# Patient Record
Sex: Female | Born: 1950
Health system: Southern US, Community
[De-identification: ages and names within clinical notes are randomized; demographics above are authoritative.]

## PROBLEM LIST (undated history)

## (undated) DIAGNOSIS — E079 Disorder of thyroid, unspecified: Secondary | ICD-10-CM

## (undated) HISTORY — PX: EYE SURGERY: SHX253

## (undated) HISTORY — PX: BLADDER REMOVAL: SHX567

## (undated) HISTORY — PX: BARTHOLIN CYST MARSUPIALIZATION: SHX5383

## (undated) HISTORY — DX: Disorder of thyroid, unspecified: E07.9

## (undated) HISTORY — PX: COSMETIC SURGERY: SHX468

## (undated) HISTORY — PX: LIPOSUCTION: SHX10

## (undated) HISTORY — PX: OTHER SURGICAL HISTORY: SHX169

---

## 2007-07-14 LAB — HM CT VIRTUAL COLONOSCOPY

## 2008-02-21 ENCOUNTER — Encounter (INDEPENDENT_AMBULATORY_CARE_PROVIDER_SITE_OTHER): Payer: Self-pay | Admitting: *Deleted

## 2008-02-21 ENCOUNTER — Encounter: Admission: RE | Admit: 2008-02-21 | Discharge: 2008-02-21 | Payer: Self-pay | Admitting: Family Medicine

## 2008-03-03 ENCOUNTER — Encounter: Admission: RE | Admit: 2008-03-03 | Discharge: 2008-04-18 | Payer: Self-pay | Admitting: Unknown Physician Specialty

## 2008-11-22 ENCOUNTER — Encounter: Admission: RE | Admit: 2008-11-22 | Discharge: 2008-11-22 | Payer: Self-pay | Admitting: Sports Medicine

## 2008-11-22 ENCOUNTER — Ambulatory Visit: Payer: Self-pay | Admitting: Sports Medicine

## 2008-11-22 DIAGNOSIS — M76899 Other specified enthesopathies of unspecified lower limb, excluding foot: Secondary | ICD-10-CM

## 2008-11-22 DIAGNOSIS — R109 Unspecified abdominal pain: Secondary | ICD-10-CM | POA: Insufficient documentation

## 2009-01-11 ENCOUNTER — Ambulatory Visit: Payer: Self-pay | Admitting: Sports Medicine

## 2009-01-11 DIAGNOSIS — M7750 Other enthesopathy of unspecified foot: Secondary | ICD-10-CM | POA: Insufficient documentation

## 2009-12-05 ENCOUNTER — Encounter (INDEPENDENT_AMBULATORY_CARE_PROVIDER_SITE_OTHER): Payer: Self-pay | Admitting: *Deleted

## 2010-01-23 ENCOUNTER — Ambulatory Visit: Payer: Self-pay | Admitting: Gastroenterology

## 2010-01-23 DIAGNOSIS — M542 Cervicalgia: Secondary | ICD-10-CM | POA: Insufficient documentation

## 2010-01-23 DIAGNOSIS — R1310 Dysphagia, unspecified: Secondary | ICD-10-CM | POA: Insufficient documentation

## 2010-01-24 ENCOUNTER — Telehealth: Payer: Self-pay | Admitting: Physician Assistant

## 2010-01-24 ENCOUNTER — Ambulatory Visit: Payer: Self-pay | Admitting: Internal Medicine

## 2010-01-31 ENCOUNTER — Telehealth: Payer: Self-pay | Admitting: Gastroenterology

## 2010-01-31 DIAGNOSIS — J984 Other disorders of lung: Secondary | ICD-10-CM | POA: Insufficient documentation

## 2010-02-06 ENCOUNTER — Ambulatory Visit (HOSPITAL_COMMUNITY): Admission: RE | Admit: 2010-02-06 | Discharge: 2010-02-06 | Payer: Self-pay | Admitting: Gastroenterology

## 2010-02-18 ENCOUNTER — Ambulatory Visit: Payer: Self-pay | Admitting: Internal Medicine

## 2010-04-26 ENCOUNTER — Encounter
Admission: RE | Admit: 2010-04-26 | Discharge: 2010-04-26 | Payer: Self-pay | Source: Home / Self Care | Attending: Internal Medicine | Admitting: Internal Medicine

## 2010-05-02 ENCOUNTER — Telehealth (INDEPENDENT_AMBULATORY_CARE_PROVIDER_SITE_OTHER): Payer: Self-pay | Admitting: *Deleted

## 2010-05-21 NOTE — Letter (Signed)
Summary: New Patient letter  [facility]  [address] [place], [place] [address]   Phone: [phone]  Fax: [phone]       12/05/2009 MRN: [id number]  Susan Davila Davila [address] [place], [place]  [address]  Dear Ms. Susan Davila,  Welcome to the Gastroenterology Division at [facility].    You are scheduled to see Dr.  Arlyce Dice on 01-23-10 at 8:30a.m. on the 3rd floor at [facility], [address]  We ask that you try to arrive at our office 15 minutes prior to your appointment time to allow for check-in.  We would like you to complete the enclosed self-administered evaluation form prior to your visit and bring it with you on the day of your appointment.  We will review it with you.  Also, please bring a complete list of all your medications or, if you prefer, bring the medication bottles and we will list them.  Please bring your insurance card so that we may make a copy of it.  If your insurance requires a referral to see a specialist, please bring your referral form from your primary care physician.  Co-payments are due at the time of your visit and may be paid by cash, check or credit card.     Your office visit will consist of a consult with your physician (includes a physical exam), any laboratory testing he/she may order, scheduling of any necessary diagnostic testing (e.g. x-ray, ultrasound, CT-scan), and scheduling of a procedure (e.g. Endoscopy, Colonoscopy) if required.  Please allow enough time on your schedule to allow for any/all of these possibilities.    If you cannot keep your appointment, please call [phone] to cancel or reschedule prior to your appointment date.  This allows [place] the opportunity to schedule an appointment for another patient in need of care.  If you do not cancel or reschedule by 5 p.m. the business day prior to your appointment date, you will be charged a $50.00 late cancellation/no-show fee.    Thank you for choosing [facility] for your medical needs.  We appreciate the opportunity to care for you.  Please visit [place] at our website  to learn more about our practice.                     Sincerely,                                                             The Gastroenterology Division

## 2010-05-21 NOTE — Assessment & Plan Note (Signed)
Summary: Pulmonary eval for hoarseness/ spn rul     Visit Type:  Initial Consult Primary Muadh Creasy/Referring Sharri Loya:  Osie Cheeks, MD  CC:  Pulmonary Nodule.  History of Present Illness: 56 yowf quit smoking in 1978 but was only a light smoker with no respiratory problems.  February 18, 2010  1st pulmonary office eval for spn.  Story started with  blunt injury to right neck  July 2009 and difficulty singing and swallowing since  >  ENT and GI eval completed.  tried GERD rx x 8 weeks before but only prilosec and not ac or with any diet restrictions.  .No progression of symptoms, no dyspnea or variability.  Pt denies any significantitching, sneezing,   excess or purulent  secretions,  fever, chills, sweats, unintended wt loss, pleuritic or exertional cp, hempoptysis, change in activity tolerance  orthopnea pnd or leg swelling. Pt also denies any obvious fluctuation in symptoms with weather or environmental change or other alleviating or aggravating factors.       Current Medications (verified): 1)  Levothyroxine Sodium 50 Mcg Tabs (Levothyroxine Sodium) .... One Tablet By Mouth Daily 2)  Pristiq 50 Mg Xr24h-Tab (Desvenlafaxine Succinate) .... 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 Quit in 1977.  Smoked 7 yrs up to 1 ppd ETOH daily- Wine Lives with spouse, 3 cats and 3 dogs  Review of Systems       The patient complains of acid heartburn, indigestion, difficulty swallowing, sore throat, nasal congestion/difficulty breathing through nose, and ear ache.  The patient denies shortness of  breath with activity, shortness of breath at rest, productive cough, non-productive cough, coughing up blood, chest pain, irregular heartbeats, loss of appetite, weight change, abdominal pain, tooth/dental problems, headaches, sneezing, itching, anxiety, depression, hand/feet swelling, joint stiffness or pain, rash, change in color of mucus, and fever.    Vital Signs:  Patient profile:   60 year old female Weight:      163 pounds O2 Sat:      95 % on Room air Temp:     98.0 degrees F oral Pulse rate:   64 / minute BP sitting:   122 / 78  (left arm)  Vitals Entered By: Vernie Murders (February 18, 2010 11:23 AM)  O2 Flow:  Room air  Physical Exam  Additional Exam:   wt 158 > 163 February 19, 2010 HEENT: nl dentition, turbinates, and orophanx. Nl external ear canals without cough reflex NECK :  without JVD/Nodes/TM/ nl carotid upstrokes bilaterally LUNGS: no acc muscle use, clear to A and P bilaterally without cough on insp or exp maneuvers CV:  RRR  no s3 or murmur or increase in P2, no edema  ABD:  soft and nontender with nl excursion in the supine position. No bruits or organomegaly, bowel sounds nl MS:  warm without deformities, calf tenderness, cyanosis or clubbing SKIN: warm and dry without lesions   NEURO:  alert, approp, no deficits      Impression & Recommendations:  Problem # 1:  PULMONARY NODULE (ICD-518.89) No baseline cxr available, completely unrelated to  symptoms which are probably at least partially GERD related.  Too small to bx, PET is negative so excisional bx not indicated here but if any growth at all at 3 months would consider this   Discussed in detail all the  indications, usual  risks and alternatives  relative to the benefits with patient who agrees to proceed with full chest Ct in 3 months  Problem # 2:  DYSPHAGIA UNSPECIFIED (ICD-787.20)  Typical of  Classic Upper airway cough syndrome, so named because it's frequently impossible to sort out how much  is  CR/sinusitis with freq throat clearing (which can be related to primary GERD)   vs  causing  secondary extra esophageal GERD from wide swings in gastric pressure that occur with throat clearing, promoting self use of mint and menthol lozenges that reduce the lower esophageal sphincter tone and exacerbate the problem further These are the same pts who not infrequently have failed to tolerate ace inhibitors,  dry powder inhalers or biphosphonates or report having reflux symptoms that don't respond to standard doses of PPI  Rec max gerd rx to start with ? consider neurontin trial for ? neuralgia  > next step  See instructions for specific recommendations   Orders: Consultation Level V (81191)  Medications Added to Medication List This Visit: 1)  Prilosec Otc 20 Mg Tbec (Omeprazole magnesium) .... Take one 30-60 min before first and last meals of the day 2)  Pepcid 20 Mg Tabs (Famotidine) .... Take one by mouth at bedtime  Other Orders: Misc. Referral (Misc. Ref)  Patient Instructions: 1)  GERD (REFLUX)  is a common cause of respiratory symptoms. It commonly presents without heartburn and can be treated with medication, but also with lifestyle changes including avoidance of late meals, excessive alcohol, smoking cessation, and avoid fatty foods, chocolate, peppermint, colas, red wine, and acidic juices such as orange juice. NO MINT OR MENTHOL PRODUCTS SO NO COUGH DROPS  2)  USE SUGARLESS CANDY INSTEAD (jolley ranchers)  3)  NO OIL BASED VITAMINS  4)  Prilosec Take one 30-60 min before first and last meals of the day and pepcid 20 mg one at bedtime 5)  see patient coordinator for CT Chest no contrast in 3 months ( Apr 26 2010 ) to follow up lung nodules

## 2010-05-21 NOTE — Letter (Signed)
Summary: Results Letter  [facility]  [address] [place], [place] [address]   Phone: [phone]  Fax: [phone]        January 23, 2010 MRN: [id number]    ROSLIND MICHAUX [address] [place], [place]  [address]    Dear Ms. WEST,  It is my pleasure to have treated you recently as a new patient in my office. I appreciate your confidence and the opportunity to participate in your care.  Since I do have a busy inpatient endoscopy schedule and office schedule, my office hours vary weekly. I am, however, available for emergency calls everyday through my office. If I am not available for an urgent office appointment, another one of our gastroenterologist will be able to assist you.  My well-trained staff are prepared to help you at all times. For emergencies after office hours, a physician from our Gastroenterology section is always available through my 24 hour answering service  Once again I welcome you as a new patient and I look forward to a happy and healthy relationship             Sincerely,  Louis Meckel MD  This letter has been electronically signed by your physician.  Appended Document: Results Letter LETTER MAILED  Appended Document: Results Letter letter mailed

## 2010-05-21 NOTE — Assessment & Plan Note (Signed)
Summary: problems swallowing--ch.   History of Present Illness Visit Type: Initial Visit Primary GI MD: Melvia Heaps MD [facility] Primary Provider: Osie Cheeks, MD Chief Complaint: dysphagia- feels like there is some blokage on right side of throat, also a built up of mucous History of Present Illness:   Susan Davila is a pleasant 60 year old white female referred at the request of Dr. Hebert Soho for evaluation of neck discomfort.  Approximally  a year and half ago she onto her neck on the right side.  Since that time she has had the sensation of blockage on the right side of her throat.  This occurs spontaneously and with swallowing.  She  has  see hte sensation of spontaneious drainage.  She denies dysphagia, per se.  She had a thorough ENT examination that did not reveal any abnormalities.  Since her trauma she has had difficulty swallowing.   She complains of a buildup of mucous in the right side of her throat that eventually drains.  Symptoms did not improve with PPI therapy and antihistamines.  The patient underwent virtual colonoscopy in November, 2009 for colorectal cancer screening.  This test was   GI Review of Systems      Denies abdominal pain, acid reflux, belching, bloating, chest pain, dysphagia with liquids, dysphagia with solids, heartburn, loss of appetite, nausea, vomiting, vomiting blood, weight loss, and  weight gain.        Denies anal fissure, black tarry stools, change in bowel habit, constipation, diarrhea, diverticulosis, fecal incontinence, heme positive stool, hemorrhoids, irritable bowel syndrome, jaundice, light color stool, liver problems, rectal bleeding, and  rectal pain. Preventive Screening-Counseling & Management  Alcohol-Tobacco     Smoking Status: quit      Drug Use:  no.      Current Medications (verified): 1)  Levothyroxine Sodium 50 Mcg Tabs (Levothyroxine Sodium) .... One Tablet By Mouth Daily 2)  Pristiq 50 Mg Xr24h-Tab (Desvenlafaxine  Succinate) .... 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  Alcohol Use - yes Illicit Drug Use - no Smoking Status:  quit Drug Use:  no  Review of Systems       The patient complains of sleeping problems, sore throat, and voice change.  The patient denies allergy/sinus, anemia, anxiety-new, arthritis/joint pain, back pain, blood in urine, breast changes/lumps, change in vision, confusion, cough, coughing up blood, depression-new, fainting, fatigue, fever, headaches-new, hearing problems, heart murmur, heart rhythm changes, itching, menstrual pain, muscle pains/cramps, night sweats, nosebleeds, pregnancy symptoms, shortness of breath, skin rash, swelling of feet/legs, swollen lymph glands, thirst - excessive , urination - excessive , urination changes/pain, urine leakage, and vision changes.         All other systems were reviewed and were negative   Vital Signs:  Patient profile:   60 year old female Height:      68 inches Weight:      158.13 pounds BMI:     24.13 Pulse rate:   64 / minute Pulse rhythm:   regular BP sitting:   118 / 70  (left arm) Cuff size:   regular  Vitals Entered By: June McMurray CMA Duncan Dull) (January 23, 2010 8:22 AM)  Physical Exam  Additional Exam:  On  physical exam she is a well-developed well-nourished female  skin: anicteric HEENT: normocephalic; PEERLA; no nasal or pharyngeal abnormalities neck: supple nodes: no cervical lymphadenopathy chest: clear to ausculatation and percussion heart: no murmurs, gallops, or rubs abd: soft, nontender; BS normoactive; no abdominal masses, tenderness, organomegaly rectal:  deferred ext: no cynanosis, clubbing, edema skeletal: no deformities neuro: oriented x 3; no focal abnormalities    Impression & Recommendations:  Problem # 1:  DYSPHAGIA UNSPECIFIED (ICD-787.20)  Dysphagia actually refers to the discomfort in her throat.  This clearly is related to her trauma and may represent some structural abnormality.  Primary esophageal abnormality including stricture is unlikely.  Is no obvious abnormality by physical exam  Recommendations #1 CT of the neck  Orders: T-CT Neck w/cm ([id number])  Patient Instructions: 1)  Copy sent to : Osie Cheeks, MD 2)  You are scheduled at [facility] CT on October 6th at 9am  3)  CT of the neck due to neck pain 4)  The medication list was reviewed and reconciled.  All changed / newly prescribed medications were explained.  A complete medication list was provided to the patient / caregiver.

## 2010-05-21 NOTE — Progress Notes (Signed)
Summary: Review from doctor to have CT of neck  Phone Note Outgoing Call Call back at [facility] Phone [phone]   Call placed by: Merri Ray CMA Duncan Dull),  January 24, 2010 8:20 AM Summary of Call: Called pt to inform we are having problems with insurance covering her CT of the neck. Spoke with Mike Gip yesterday gave her the information for a pt review. Have not heard back yet. Called pt to cancel test. Can not get in touch with her this AM. Called Jody at CT to talk with the pt and see if she wants to cancel..... Initial call taken by: Merri Ray CMA Duncan Dull),  January 24, 2010 8:22 AM  Follow-up for Phone Call        DONE Follow-up by: Sammuel Cooper PA-c,  January 24, 2010 8:46 AM     Appended Document: Review from doctor to have CT of neck CT OF NECK APPROVED [id number] ALSO IF PT NEEDS MRI OF SPINE SHE WOULD BE APPROVED FOR THAT ALSO

## 2010-05-21 NOTE — Progress Notes (Signed)
Summary: results request  Phone Note Call from Patient Call back at Home Phone [phone]   Caller: Patient Call For: Dr. Arlyce Dice Reason for Call: Talk to Nurse Summary of Call: would like CT scan results Initial call taken by: Vallarie Mare,  January 31, 2010 2:31 PM  Follow-up for Phone Call        Dr Arlyce Dice patient was calling back for results.  She has not heard from Dr Richeter's office about additional scan.  Is it ok for [place] to set up PET as recommended in the CT neck report?  Or refer her to pulmonary, she is very concerned? Follow-up by: Darcey Nora RN, CGRN,  January 31, 2010 3:01 PM  Additional Follow-up for Phone Call Additional follow up Details #1::        Lets go ahead with PET scan and refer her to pulmonary Additional Follow-up by: Louis Meckel MD,  January 31, 2010 4:24 PM  New Problems: PULMONARY NODULE (ICD-518.89)   Additional Follow-up for Phone Call Additional follow up Details #2::    PET scan ordered for 02/06/10 [facility] 7:00 Pulmonary referral to [facility] 02/18/10 11:00.  Patient  aware of both appts  Follow-up by: Darcey Nora RN, CGRN,  January 31, 2010 4:43 PM  New Problems: PULMONARY NODULE (ICD-518.89)

## 2010-05-23 NOTE — Progress Notes (Signed)
  Phone Note Other Incoming   Request: Send information Summary of Call: Request for records received from the patient. Request forwarded to [employer].  Susan Davila records from 2011 to 2012 sent to Dr. Erline Hau.

## 2010-06-18 ENCOUNTER — Telehealth (INDEPENDENT_AMBULATORY_CARE_PROVIDER_SITE_OTHER): Payer: Self-pay | Admitting: *Deleted

## 2010-06-27 NOTE — Progress Notes (Signed)
  Phone Note Other Incoming   Request: Send information Summary of Call:  Request for records received from Patient, request forwarded to [facility]

## 2010-07-03 LAB — GLUCOSE, CAPILLARY: Glucose-Capillary: 104 mg/dL — ABNORMAL HIGH (ref 70–99)

## 2010-11-05 ENCOUNTER — Telehealth: Payer: Self-pay | Admitting: Internal Medicine

## 2010-11-05 NOTE — Telephone Encounter (Signed)
9-24 months was the radiology recommendation so we picked the earliest date - not sure insurance will cover @ < 9 months but can try and if not do a very limited study that will save money in September (and less radiation) than a whole study

## 2010-11-05 NOTE — Telephone Encounter (Signed)
Pt says she will be due for repeat CT chest around 9/1. She would like to have this done before then if Mw agrees due to insurance reasons. Pls advise.

## 2010-11-05 NOTE — Telephone Encounter (Signed)
Pt is aware and says since snsurance will most likely not cover another CT scan at this time, she will wait.

## 2010-11-29 ENCOUNTER — Telehealth: Payer: Self-pay | Admitting: *Deleted

## 2010-11-29 NOTE — Telephone Encounter (Signed)
Message copied by Christen Butter on Fri Nov 29, 2010  4:58 PM ------      Message from: Christen Butter      Created: Mon Jul 22, 2010  3:46 PM       Needs repeat limited ct chest f/u spn

## 2010-11-29 NOTE — Telephone Encounter (Signed)
Repeat limited ct chest due Sept per MW- called pt to advise and go ahead get this sched. LMTCB.

## 2010-12-30 NOTE — Telephone Encounter (Signed)
LMTCB

## 2011-01-01 NOTE — Telephone Encounter (Signed)
LMTCB

## 2011-01-15 ENCOUNTER — Encounter: Payer: Self-pay | Admitting: *Deleted

## 2011-01-15 NOTE — Telephone Encounter (Signed)
Will send letter.

## 2011-03-26 ENCOUNTER — Ambulatory Visit (INDEPENDENT_AMBULATORY_CARE_PROVIDER_SITE_OTHER): Payer: 59

## 2011-03-26 DIAGNOSIS — N390 Urinary tract infection, site not specified: Secondary | ICD-10-CM

## 2011-08-12 ENCOUNTER — Other Ambulatory Visit: Payer: Self-pay | Admitting: Family Medicine

## 2011-08-26 ENCOUNTER — Ambulatory Visit (INDEPENDENT_AMBULATORY_CARE_PROVIDER_SITE_OTHER): Payer: 59 | Admitting: Family Medicine

## 2011-08-26 VITALS — BP 126/80

## 2011-08-26 DIAGNOSIS — M7072 Other bursitis of hip, left hip: Secondary | ICD-10-CM

## 2011-08-26 DIAGNOSIS — G5702 Lesion of sciatic nerve, left lower limb: Secondary | ICD-10-CM

## 2011-08-26 DIAGNOSIS — G5700 Lesion of sciatic nerve, unspecified lower limb: Secondary | ICD-10-CM

## 2011-08-26 DIAGNOSIS — M76899 Other specified enthesopathies of unspecified lower limb, excluding foot: Secondary | ICD-10-CM

## 2011-08-26 NOTE — Patient Instructions (Signed)
PIRIFORMIS SYNDROME and HIP REHAB 2. hip abductor rotations. standing, hip flexion and rotation outward then inward. 3 sets, 15 reps. when can do comfortably, add ankle weights starting at 2 pounds.  3. cross over stretching - shoulder back to ground, same side leg crossover. 3-5 sets for 30 min..  4. SINK STRETCH - YOU CAN DO THIS WHENEVER YOU WANT DURING THE DAY  Tennis ball underneath area in buttocks - on a hard surface underneath  Hip abductions to side. 3 x 30

## 2011-08-29 NOTE — Progress Notes (Signed)
  Patient Name: Susan Davila Date of Birth: [birth year] Age: 61 y.o. Medical Record Number: [id number] Gender: female Date of Encounter: 08/26/2011  History of Present Illness:  Susan Davila is a 61 y.o. very pleasant female patient who presents with the following:  Pleasant patient with a history of some hip arthritis in the past presents with left-sided mostly lateral and slightly posterior hip pain, and also has some degree of radiculopathy going down her LEFT leg. No trauma or accident. No repetitive motion injuries and she can recall. She is active and does YOGA routinely. No significant back pain. No abdominal or flank pain. No numbness or tingling. No bowel or bladder incontinence. No prior operative interventions in the affected hip and no fractures.  Past Medical History, Surgical History, Social History, Family History, Problem List, Medications, and Allergies have been reviewed and updated if relevant.  Review of Systems:  GEN: No fevers, chills. Nontoxic. Primarily MSK c/o today. MSK: Detailed in the HPI GI: tolerating PO intake without difficulty Neuro: detailed above Otherwise the pertinent positives of the ROS are noted above.    Physical Examination: Filed Vitals:   08/26/11 1616  BP: 126/80    There is no height or weight on file to calculate BMI.   GEN: WDWN, NAD, Non-toxic, Alert & Oriented x 3 HEENT: Atraumatic, Normocephalic.  Ears and Nose: No external deformity. EXTR: No clubbing/cyanosis/edema NEURO: Normal gait.  PSYCH: Normally interactive. Conversant. Not depressed or anxious appearing.  Calm demeanor.   HIP EXAM: SIDE: L ROM: Abduction, Flexion, Internal and External range of motion: 50 of internal range of motion and external range of motion Pain with terminal IROM and EROM: no GTB: mild GTB, a greater degree just posterior to the greater trochanter. Additionally, there is maximal tenderness just caudal to this near the insertion of the gluteus  medius. SLR: NEG Knees: No effusion FABER: NT REVERSE FABER: POS Piriformis: POS at direct palpation Str: flexion: 5/5 abduction: 4/5 on L adduction: 5/5  Assessment and Plan:  1. Piriformis syndrome of left side   2. Hip bursitis, left    Also given a handout with more extensive Piriformis stretching, hip flexor and abductor strengthening, ham stretching. See in p/i  Rec deep massage, explained self-massage with ball  Also, she has mild trochanter bursitis, and a bursitis more posterior than the classic trochanteric bursitis. She also seems to have some insertional tendinopathy with gluteus, which would make sense with some decreased strength and pain with testing.  Hip Bursitis Injection Verbal consent obtained. Risks (including infection, potential atrophy), benefits, and alternatives reviewed. Hip bursitis isolated with examination and palpation. Additionally, isolated areas of maximal tenderness at gluteus medius insertion. Sterilely prepped with Betadine. Ethyl Chloride used for anesthesia. 8 cc of Lidocaine 1% injected with 2 cc of 40 mg Depo-Medrol use. At the site of the bursa, injection was taken to bone, and fluid injected easily. Bursa massaged. One half of the injection was placed in a fanlike fashion at the insertion point of the gluteus medius. No bleeding and no complications. Decreased pain after injection. Needle: 22 gauge spinal needle   Orders Today: No orders of the defined types were placed in this encounter.    Medications Today: No orders of the defined types were placed in this encounter.

## 2011-09-15 ENCOUNTER — Other Ambulatory Visit (INDEPENDENT_AMBULATORY_CARE_PROVIDER_SITE_OTHER): Payer: 59 | Admitting: Family Medicine

## 2011-09-15 ENCOUNTER — Encounter: Payer: Self-pay | Admitting: Family Medicine

## 2011-09-15 ENCOUNTER — Ambulatory Visit (INDEPENDENT_AMBULATORY_CARE_PROVIDER_SITE_OTHER): Payer: 59 | Admitting: Family Medicine

## 2011-09-15 DIAGNOSIS — R05 Cough: Secondary | ICD-10-CM

## 2011-09-15 DIAGNOSIS — E039 Hypothyroidism, unspecified: Secondary | ICD-10-CM

## 2011-09-15 DIAGNOSIS — R059 Cough, unspecified: Secondary | ICD-10-CM

## 2011-09-15 NOTE — Progress Notes (Signed)
This 61 year old woman who originally came in for lab only. She's get her yearly thyroid check. Nevertheless she has had a cough for several days and wants that looked at as well. Therefore we changed her visit from a lab only to a visit.  The cough is nonproductive, she has no asthma history, she's an ex-smoker. She has no fever sore throat, or earache.  Objective: HEENT unremarkable  Neck supple no adenopathy or thyromegaly  Skin: Clear  Chest: Few rhonchi otherwise negative  Heart: Regular no murmur  Extremities: No edema  Patient is in no acute distress  Assessment: As patient's been out of medicine several days ago because TSH to help as much and evaluating her baseline. Therefore asked her to come back in 2 months when she's restarted her Synthroid.  She also has a bronchitis for which will be treating her with Z-Pak.

## 2011-09-17 ENCOUNTER — Telehealth: Payer: Self-pay

## 2011-09-17 DIAGNOSIS — R05 Cough: Secondary | ICD-10-CM

## 2011-09-17 DIAGNOSIS — R059 Cough, unspecified: Secondary | ICD-10-CM

## 2011-09-17 NOTE — Telephone Encounter (Signed)
Patient states that she is not any better.  Coughing a lot but no different than she was on Monday.  Wants to know if she can try something else.  Would also like oow note to cover through Friday if possible.

## 2011-09-17 NOTE — Telephone Encounter (Signed)
Saw dr L Monday and given z-pak. Not feeling worse, but not better. Is there something else to try? Also needs extended oow note.   bf

## 2011-09-18 ENCOUNTER — Telehealth: Payer: Self-pay

## 2011-09-18 DIAGNOSIS — R05 Cough: Secondary | ICD-10-CM

## 2011-09-18 DIAGNOSIS — R059 Cough, unspecified: Secondary | ICD-10-CM

## 2011-09-18 MED ORDER — METHYLPREDNISOLONE 4 MG PO KIT: PACK | ORAL | Status: AC

## 2011-09-18 NOTE — Telephone Encounter (Signed)
I have ordered medrol to help with the cough.  Return 48 hours if not improving.

## 2011-09-18 NOTE — Telephone Encounter (Signed)
Pt's husband CB to check status of message. Explained that Medrol has been sent to pharm. He reports that pt is not much improved at all and she only has one more day of the z-pack. He realizes that it keeps working for 5 more days but is concerned that there has been almost no improvement yet. Sxs are mostly coughing but also fatigue and also soreness. They will get her the cough med and see how she is doing tomorrow, and if still no improvement they will CB.

## 2011-09-18 NOTE — Telephone Encounter (Signed)
Pt states that she will need a note for being out of work between the dates of 09/17/11-09/19/11, pt states that she is still coughing and wheezing.

## 2011-09-18 NOTE — Telephone Encounter (Signed)
I know of no association between hypthyroidism and medrol.  Furthermore, patient is being treated for hypothyroidism and is presumably normal with regards to thyroid hormone levels.  Be that as it may, I would like her to see a pulmonary specialist to clear up the cough and yes she may have the work note she requests.

## 2011-09-18 NOTE — Telephone Encounter (Signed)
Dr L patient did not want to take the Medrol due to the contraindication with hypothyroid per pharmacy. Advised to not get Medrol and we would message Dr L for instructions due to patients concern and she requested to speak with him before taking.

## 2011-09-19 NOTE — Telephone Encounter (Signed)
Left message on voicemail for return call.

## 2011-09-19 NOTE — Telephone Encounter (Signed)
Spoke with patient. Advised of Dr Loma Boston instructions. Patient states that she is doing much better today. Cough is better and she believes that all is well. She appreciates all that we have done for her and all the calls we have answered for her.

## 2011-10-09 ENCOUNTER — Institutional Professional Consult (permissible substitution): Payer: 59 | Admitting: Internal Medicine

## 2012-01-09 ENCOUNTER — Other Ambulatory Visit: Payer: Self-pay | Admitting: Family Medicine

## 2012-02-17 ENCOUNTER — Telehealth: Payer: Self-pay

## 2012-02-17 NOTE — Telephone Encounter (Signed)
Former Dr. Hal Hope pt, saw Dr. Elbert Ewings last. Pt is out of thyroid med and states that Dr. Elbert Ewings would refill based on her last visit with him. Pt [phone] [facility] on [address]

## 2012-02-18 MED ORDER — LEVOTHYROXINE SODIUM 25 MCG PO TABS: 25.0000 ug | ORAL_TABLET | Freq: Every day | ORAL | Status: DC

## 2012-02-18 NOTE — Telephone Encounter (Signed)
Dr L, you saw pt and d/w her thyroid issues on 09/15/11 but we did not do a TSH. Her last TSH in our records is 11/03/10 and result was 1.822. We RFd her Rx on 01/13/12 for #30 w/note stating that she needed an OV. Do you want to deny RF or give her some until she can get in?

## 2012-02-18 NOTE — Telephone Encounter (Signed)
The chart is restricted.  I cannot access it.

## 2012-02-18 NOTE — Telephone Encounter (Signed)
Sent in RF and notified pt on VM that was done and OV need for add'l RFS

## 2012-02-18 NOTE — Telephone Encounter (Signed)
Refill x 1 month and have patient return for TSH

## 2012-03-01 ENCOUNTER — Ambulatory Visit (INDEPENDENT_AMBULATORY_CARE_PROVIDER_SITE_OTHER): Payer: 59 | Admitting: Emergency Medicine

## 2012-03-01 VITALS — BP 122/78 | HR 69 | Temp 97.6°F | Resp 17 | Ht 68.0 in | Wt 168.0 lb

## 2012-03-01 DIAGNOSIS — E039 Hypothyroidism, unspecified: Secondary | ICD-10-CM

## 2012-03-01 NOTE — Progress Notes (Signed)
[facility], [place] [address] [place] [address] [phone]  Date:  03/01/2012   Name:  Susan Davila   DOB:  July 21, 1950   MRN:  [id number]  PCP:  No primary provider on file.    Chief Complaint: Medication Refill   History of Present Illness:  Susan Davila is a 61 y.o. very pleasant female patient who presents with the following:  History of hypothyroidism and needs labs prior to refill.  No adverse symptoms.  No complaints.    Patient Active Problem List  Diagnosis  . PULMONARY NODULE  . NECK PAIN  . TROCHANTERIC BURSITIS, LEFT  . METATARSALGIA  . DYSPHAGIA UNSPECIFIED  . INGUINAL PAIN, LEFT    History reviewed. No pertinent past medical history.  Past Surgical History  Procedure Date  . Cosmetic surgery   . Bladder removal     History  Substance Use Topics  . Smoking status: Former Smoker    Quit date: 09/15/1978  . Smokeless tobacco: Never Used  . Alcohol Use: No    No family history on file.  No Known Allergies  Medication list has been reviewed and updated.  Current Outpatient Prescriptions on File Prior to Visit  Medication Sig Dispense Refill  . levothyroxine (SYNTHROID, LEVOTHROID) 25 MCG tablet Take 1 tablet (25 mcg total) by mouth daily.  30 tablet  0    Review of Systems:  As per HPI, otherwise negative.    Physical Examination: Filed Vitals:   03/01/12 1227  BP: 122/78  Pulse: 69  Temp: 97.6 F (36.4 C)  Resp: 17   Filed Vitals:   03/01/12 1227  Height: 5\' 8"  (1.727 m)  Weight: 168 lb (76.204 kg)   Body mass index is 25.54 kg/(m^2). Ideal Body Weight: Weight in (lb) to have BMI = 25: 164.1   GEN: WDWN, NAD, Non-toxic, A & O x 3 HEENT: Atraumatic, Normocephalic. Neck supple. No masses, No LAD. Ears and Nose: No external deformity. CV: RRR, No M/G/R. No JVD. No thrill. No extra heart sounds. PULM: CTA B, no wheezes, crackles, rhonchi. No retractions. No resp. distress. No accessory muscle use. ABD: S,  NT, ND, +BS. No rebound. No HSM. EXTR: No c/c/e NEURO Normal gait.  PSYCH: Normally interactive. Conversant. Not depressed or anxious appearing.  Calm demeanor.    Assessment and Plan: Acquired hypothyroidism TSH  Carmelina Dane, MD

## 2012-03-02 LAB — COMPREHENSIVE METABOLIC PANEL
ALT: 15 U/L (ref 0–35)
AST: 19 U/L (ref 0–37)
Alkaline Phosphatase: 49 U/L (ref 39–117)
Calcium: 9.4 mg/dL (ref 8.4–10.5)
Chloride: 100 mEq/L (ref 96–112)
Creat: 0.7 mg/dL (ref 0.50–1.10)

## 2012-03-02 LAB — LIPID PANEL
LDL Cholesterol: 91 mg/dL (ref 0–99)
Total CHOL/HDL Ratio: 1.9 Ratio
VLDL: 9 mg/dL (ref 0–40)

## 2012-03-02 LAB — TSH: TSH: 1.114 u[IU]/mL (ref 0.350–4.500)

## 2012-03-02 LAB — VITAMIN D 25 HYDROXY (VIT D DEFICIENCY, FRACTURES): Vit D, 25-Hydroxy: 30 ng/mL (ref 30–89)

## 2012-03-04 NOTE — Progress Notes (Signed)
Reviewed and agree.

## 2012-03-05 ENCOUNTER — Other Ambulatory Visit: Payer: Self-pay | Admitting: Emergency Medicine

## 2012-03-05 MED ORDER — LEVOTHYROXINE SODIUM 25 MCG PO TABS: 25.0000 ug | ORAL_TABLET | Freq: Every day | ORAL | Status: DC

## 2012-09-04 ENCOUNTER — Ambulatory Visit (INDEPENDENT_AMBULATORY_CARE_PROVIDER_SITE_OTHER): Payer: 59 | Admitting: Internal Medicine

## 2012-09-04 VITALS — BP 112/70 | HR 72 | Temp 97.5°F | Resp 16 | Ht 68.0 in | Wt 166.0 lb

## 2012-09-04 DIAGNOSIS — R350 Frequency of micturition: Secondary | ICD-10-CM

## 2012-09-04 DIAGNOSIS — N390 Urinary tract infection, site not specified: Secondary | ICD-10-CM

## 2012-09-04 LAB — POCT UA - MICROSCOPIC ONLY

## 2012-09-04 LAB — POCT URINALYSIS DIPSTICK
Glucose, UA: NEGATIVE
Nitrite, UA: NEGATIVE
Protein, UA: 30
Urobilinogen, UA: 0.2

## 2012-09-04 MED ORDER — CIPROFLOXACIN HCL 250 MG PO TABS: 250.0000 mg | ORAL_TABLET | Freq: Two times a day (BID) | ORAL | Status: DC

## 2012-09-04 NOTE — Progress Notes (Signed)
  Subjective:    Patient ID: Susan Davila, female    DOB: 1951/02/08, 62 y.o.   MRN: [id number]  HPI Urgency frequency and in stream dysuria for 7 days No fever, abdominal pain, flank pain, vaginal symptoms Last UTI many years ago  Only current meds Synthroid Still running   Review of Systems Noncontributory     Objective:   Physical Exam BP 112/70  Pulse 72  Temp(Src) 97.5 F (36.4 C) (Oral)  Resp 16  Ht 5\' 8"  (1.727 m)  Wt 166 lb (75.297 kg)  BMI 25.25 kg/m2  SpO2 100% No abdominal  Tenderness  no CVA tenderness      Results for orders placed in visit on 09/04/12  POCT UA - MICROSCOPIC ONLY      Result Value Range   WBC, Ur, HPF, POC tntc     RBC, urine, microscopic tntc     Bacteria, U Microscopic -     Mucus, UA -     Epithelial cells, urine per micros -     Crystals, Ur, HPF, POC -     Casts, Ur, LPF, POC -     Yeast, UA -    POCT URINALYSIS DIPSTICK      Result Value Range   Color, UA yellow     Clarity, UA cloudy     Glucose, UA neg     Bilirubin, UA neg     Ketones, UA neg     Spec Grav, UA 1.015     Blood, UA small     pH, UA 8.5     Protein, UA 30     Urobilinogen, UA 0.2     Nitrite, UA neg     Leukocytes, UA large (3+)       Assessment & Plan:  UT CULTURE /Cipro 250 twice a day 10 days I

## 2012-09-06 LAB — URINE CULTURE: Colony Count: 45000

## 2012-09-07 ENCOUNTER — Other Ambulatory Visit: Payer: Self-pay | Admitting: Emergency Medicine

## 2012-09-07 ENCOUNTER — Encounter: Payer: Self-pay | Admitting: Internal Medicine

## 2012-10-04 ENCOUNTER — Other Ambulatory Visit: Payer: Self-pay | Admitting: Physician Assistant

## 2012-11-05 ENCOUNTER — Other Ambulatory Visit: Payer: Self-pay | Admitting: Physician Assistant

## 2012-12-19 ENCOUNTER — Ambulatory Visit (INDEPENDENT_AMBULATORY_CARE_PROVIDER_SITE_OTHER): Payer: 59 | Admitting: Physician Assistant

## 2012-12-19 VITALS — BP 122/80 | HR 68 | Temp 98.9°F | Resp 18 | Ht 67.0 in | Wt 169.0 lb

## 2012-12-19 DIAGNOSIS — E039 Hypothyroidism, unspecified: Secondary | ICD-10-CM

## 2012-12-19 NOTE — Progress Notes (Signed)
  Subjective:    Patient ID: Susan Davila, female    DOB: Sep 21, 1950, 62 y.o.   MRN: [id number]  HPI 62 year old female presents for labwork. Needs her TSH checked. She has hypothyroidism treated with Synthroid 25 mcg - has been stable for years. No issues or complaints. Did switch from generic to name brand last year and this has made a huge difference to her. She would prefer to stay on this despite it being slightly more expensive.  It has been over a year since her last physical and she does realize she is due for this.   Patient is otherwise doing well with no other concerns today.     Review of Systems  Constitutional: Negative for fever, chills and unexpected weight change.  Respiratory: Negative for chest tightness.   Gastrointestinal: Negative for nausea and vomiting.  Neurological: Negative for dizziness and headaches.       Objective:   Physical Exam  Constitutional: She is oriented to person, place, and time. She appears well-developed and well-nourished.  HENT:  Head: Normocephalic and atraumatic.  Right Ear: External ear normal.  Left Ear: External ear normal.  Eyes: Conjunctivae are normal.  Neck: Normal range of motion. Neck supple.  Cardiovascular: Normal rate, regular rhythm and normal heart sounds.   Pulmonary/Chest: Effort normal and breath sounds normal.  Lymphadenopathy:    She has no cervical adenopathy.  Neurological: She is alert and oriented to person, place, and time.  Psychiatric: She has a normal mood and affect. Her behavior is normal. Judgment and thought content normal.          Assessment & Plan:  Unspecified hypothyroidism - Plan: TSH  TSH pending Will refill meds x 1 year if stable Recommend she schedule a CPE in the next 6-9 months for complete labwork and health screening

## 2012-12-20 LAB — TSH: TSH: 1.458 u[IU]/mL (ref 0.350–4.500)

## 2012-12-22 ENCOUNTER — Other Ambulatory Visit: Payer: Self-pay | Admitting: Physician Assistant

## 2012-12-22 MED ORDER — SYNTHROID 25 MCG PO TABS: 25.0000 ug | ORAL_TABLET | Freq: Every day | ORAL | Status: DC

## 2013-01-15 ENCOUNTER — Telehealth: Payer: Self-pay

## 2013-01-15 NOTE — Telephone Encounter (Signed)
Pt would like to speak with someone about changing the rx for synthroid

## 2013-01-16 NOTE — Telephone Encounter (Signed)
LMOM to CB. 

## 2013-01-17 MED ORDER — LEVOTHYROXINE SODIUM 25 MCG PO TABS: 25.0000 ug | ORAL_TABLET | Freq: Every day | ORAL | Status: DC

## 2013-01-17 NOTE — Telephone Encounter (Signed)
Patient wants to know if she can take levothyroxine and not have the labs checked, due to cost.

## 2013-01-17 NOTE — Telephone Encounter (Signed)
Because of brand variation, if she switches products, she should have labs in 6-8 weeks.

## 2013-01-17 NOTE — Telephone Encounter (Signed)
I am happy to switch her back to levothyroxine.  Generally, we repeat the TSH 6-8 weeks after the change before we adjust the dose.  Levothyroxine 25 mcg 1 PO QD, #30, RF x 1

## 2013-01-17 NOTE — Telephone Encounter (Signed)
Called, she is on synthroid 25 mg patient states her pharmacist has advised she may want to go back to levothyroxine, at a higher dose and this may work as well as the synthroid. This is a cost issue. Please advise.

## 2013-01-17 NOTE — Telephone Encounter (Signed)
Sent in called patient to advise. She does not want to come in for the labs, because she will have to pay out of pocket for her labs, she wants to take the levothyroxine and just follow up in 6 months as previously planned, she states she will continue synthroid, as opposed to having to pay for the labs out of pocket (new insurance)

## 2013-01-17 NOTE — Telephone Encounter (Signed)
Ok.  Please call pharmacy to cancel the change.

## 2013-01-18 MED ORDER — SYNTHROID 25 MCG PO TABS: 25.0000 ug | ORAL_TABLET | Freq: Every day | ORAL | Status: DC

## 2013-01-18 NOTE — Telephone Encounter (Signed)
Spoke to patient to advise

## 2013-01-18 NOTE — Telephone Encounter (Signed)
Thanks, called left message for patient. Called pharmacy cancelled levothyroxine reordered synthroid

## 2013-08-30 ENCOUNTER — Telehealth: Payer: Self-pay

## 2013-08-30 NOTE — Telephone Encounter (Signed)
Fax from pharm req'd change of synthroid to generic per pt req. Susan Davila OKd change w/instr's for pt to f/up in 4-6 weeks for TSH test. Faxed OK to pharm and notified pt who agreed, but stated that she has been on generic for years before changing and doesn't see any difference.

## 2013-11-26 ENCOUNTER — Other Ambulatory Visit: Payer: Self-pay | Admitting: Physician Assistant

## 2013-12-20 ENCOUNTER — Other Ambulatory Visit: Payer: Self-pay | Admitting: Physician Assistant

## 2016-12-24 ENCOUNTER — Encounter: Payer: Self-pay | Admitting: Internal Medicine

## 2017-04-25 ENCOUNTER — Ambulatory Visit (HOSPITAL_COMMUNITY)
Admission: EM | Admit: 2017-04-25 | Discharge: 2017-04-25 | Disposition: A | Discharge status: Home / Self Care | Payer: PPO | Attending: Family Medicine | Admitting: Family Medicine

## 2017-04-25 ENCOUNTER — Encounter (HOSPITAL_COMMUNITY): Payer: Self-pay | Admitting: Emergency Medicine

## 2017-04-25 ENCOUNTER — Other Ambulatory Visit: Payer: Self-pay

## 2017-04-25 DIAGNOSIS — N3090 Cystitis, unspecified without hematuria: Secondary | ICD-10-CM | POA: Diagnosis not present

## 2017-04-25 LAB — POCT URINALYSIS DIP (DEVICE)
Bilirubin Urine: NEGATIVE
GLUCOSE, UA: NEGATIVE mg/dL
Hgb urine dipstick: NEGATIVE
Ketones, ur: NEGATIVE mg/dL
NITRITE: NEGATIVE
Protein, ur: NEGATIVE mg/dL
SPECIFIC GRAVITY, URINE: 1.015 (ref 1.005–1.030)
UROBILINOGEN UA: 0.2 mg/dL (ref 0.0–1.0)
pH: 7.5 (ref 5.0–8.0)

## 2017-04-25 MED ORDER — CEPHALEXIN 500 MG PO CAPS: 500.0000 mg | ORAL_CAPSULE | Freq: Two times a day (BID) | ORAL | 0 refills | Status: DC

## 2017-04-25 NOTE — ED Notes (Signed)
Patient sent to bathroom for a clean catch with instructions

## 2017-04-25 NOTE — ED Triage Notes (Signed)
Symptoms started Wednesday or Thursday.  Patient has burning with urination, frequent urination, back pain, urgency

## 2017-04-25 NOTE — ED Provider Notes (Addendum)
  [facility]    ASSESSMENT & PLAN:  1. Cystitis     Meds ordered this encounter  Medications  . cephALEXin (KEFLEX) 500 MG capsule    Sig: Take 1 capsule (500 mg total) by mouth 2 (two) times daily.    Dispense:  10 capsule    Refill:  0   Urine culture sent. Will notify patient when results available. Will follow up with her PCP or here if not showing improvement over the next 48 hours, sooner if needed.  Outlined signs and symptoms indicating need for more acute intervention. Patient verbalized understanding. After Visit Summary given.  SUBJECTIVE:  Susan Davila is a 67 y.o. female who complains of urinary frequency, urgency and dysuria for the past 3 days. No flank pain, fever, chills, abnormal vaginal discharge or bleeding. Hematuria: not present. Normal PO intake. No flank or abdominal pain. No self treatment. Ambulatory without difficulty.  LMP: No LMP recorded. Patient is postmenopausal.  ROS: As in HPI.  OBJECTIVE:  Vitals:   04/25/17 1621  BP: 127/66  Pulse: 62  Resp: 18  Temp: 98.2 F (36.8 C)  TempSrc: Oral  SpO2: 100%   Appears well, in no apparent distress. Abdomen is soft without tenderness, guarding, mass, rebound or organomegaly. No CVA tenderness or inguinal adenopathy noted.  Labs Reviewed  POCT URINALYSIS DIP (DEVICE) - Abnormal; Notable for the following components:      Result Value   Leukocytes, UA TRACE (*)    All other components within normal limits    No Known Allergies  Past Medical History:  Diagnosis Date  . Thyroid disease    Social History   Socioeconomic History  . Marital status: Married    Spouse name: Not on file  . Number of children: Not on file  . Years of education: Not on file  . Highest education level: Not on file  Social Needs  . Financial resource strain: Not on file  . Food insecurity - worry: Not on file  . Food insecurity - inability: Not on file  . Transportation needs - medical: Not on  file  . Transportation needs - non-medical: Not on file  Occupational History  . Not on file  Tobacco Use  . Smoking status: Former Smoker    Last attempt to quit: 09/15/1978    Years since quitting: 38.6  . Smokeless tobacco: Never Used  Substance and Sexual Activity  . Alcohol use: Yes    Comment: glass of wine daily  . Drug use: No  . Sexual activity: No    Birth control/protection: None  Other Topics Concern  . Not on file  Social History Narrative  . Not on file       Vanessa Kick, MD 04/25/17 Emer[place], MD 04/25/17 [phone]

## 2018-01-05 DIAGNOSIS — H25813 Combined forms of age-related cataract, bilateral: Secondary | ICD-10-CM | POA: Diagnosis not present

## 2018-03-11 ENCOUNTER — Encounter: Payer: Self-pay | Admitting: Internal Medicine

## 2018-03-11 DIAGNOSIS — E039 Hypothyroidism, unspecified: Secondary | ICD-10-CM | POA: Diagnosis not present

## 2018-03-11 DIAGNOSIS — Z01419 Encounter for gynecological examination (general) (routine) without abnormal findings: Secondary | ICD-10-CM | POA: Diagnosis not present

## 2018-04-28 LAB — HM MAMMOGRAPHY: HM Mammogram: NORMAL (ref 0–4)

## 2018-05-06 LAB — HM PAP SMEAR

## 2018-06-04 ENCOUNTER — Ambulatory Visit (INDEPENDENT_AMBULATORY_CARE_PROVIDER_SITE_OTHER): Payer: PPO | Admitting: Orthopedic Surgery

## 2018-06-04 ENCOUNTER — Ambulatory Visit (INDEPENDENT_AMBULATORY_CARE_PROVIDER_SITE_OTHER): Payer: PPO

## 2018-06-04 ENCOUNTER — Encounter (INDEPENDENT_AMBULATORY_CARE_PROVIDER_SITE_OTHER): Payer: Self-pay | Admitting: Orthopedic Surgery

## 2018-06-04 VITALS — Ht 67.0 in | Wt 169.0 lb

## 2018-06-04 DIAGNOSIS — M19012 Primary osteoarthritis, left shoulder: Secondary | ICD-10-CM | POA: Diagnosis not present

## 2018-06-04 DIAGNOSIS — G8929 Other chronic pain: Secondary | ICD-10-CM

## 2018-06-04 DIAGNOSIS — M25512 Pain in left shoulder: Secondary | ICD-10-CM

## 2018-06-05 ENCOUNTER — Encounter (INDEPENDENT_AMBULATORY_CARE_PROVIDER_SITE_OTHER): Payer: Self-pay | Admitting: Orthopedic Surgery

## 2018-06-05 NOTE — Progress Notes (Signed)
Office Visit Note   Patient: Susan Davila           Date of Birth: 21-Oct-1950           MRN: [id number] Visit Date: 06/04/2018 Requested by: No referring provider defined for this encounter. PCP: Patient, No Pcp Per  Subjective: Chief Complaint  Patient presents with  . Left Shoulder - Pain    HPI: Susan Davila is a patient with left shoulder pain.  She had some type of injury to the left shoulder in 2000 when the shoulder popped out of socket.  She went to physical therapy and had acupuncture and the pain resolved.  Last year she reported gradual increase in pain with no injury.  She does report some popping and clicking.  She likes to work out.  Pain does run down the arm and she denies any neck pain.  She has been doing exercises in a very measured and deliberate fashion.              ROS: All systems reviewed are negative as they relate to the chief complaint within the history of present illness.  Patient denies  fevers or chills.   Assessment & Plan: Visit Diagnoses:  1. Chronic left shoulder pain   2. Arthritis of left shoulder region     Plan: Impression is fairly significant left shoulder arthritis but very functional arm in terms of range of motion and strength.  I encouraged her to continue to exercise and keep the motion in the arm as much as possible.  I think at some point she is going to need to have shoulder replacement.  For now she is very functional with overhead motion and strength.  In regards to treatment options I think an injection in the glenohumeral joint with cortisone or gel could be helpful for pain relief.  We will see her back as needed  Follow-Up Instructions: Return if symptoms worsen or fail to improve.   Orders:  Orders Placed This Encounter  Procedures  . XR Shoulder Left   No orders of the defined types were placed in this encounter.     Procedures: No procedures performed   Clinical Data: No additional findings.  Objective: Vital Signs:  Ht 5\' 7"  (1.702 m)   Wt 169 lb (76.7 kg)   BMI 26.47 kg/m   Physical Exam:   Constitutional: Patient appears well-developed HEENT:  Head: Normocephalic Eyes:EOM are normal Neck: Normal range of motion Cardiovascular: Normal rate Pulmonary/chest: Effort normal Neurologic: Patient is alert Skin: Skin is warm Psychiatric: Patient has normal mood and affect    Ortho Exam: Ortho exam demonstrates forward flexion to about 170 on the left compared to 180 on the right.  Isolated humeral abduction is 120 on the right about 110 on the left.  Cuff strength is good infraspinatus supraspinatus and subscap muscle testing on the left-hand side.  No other masses lymphadenopathy or skin changes noted on that left shoulder girdle region.  There is some coarseness and grinding consistent with bone-on-bone arthritis and not necessarily rotator cuff pathology present.  Specialty Comments:  No specialty comments available.  Imaging: Xr Shoulder Left  Result Date: 06/05/2018 AP axillary outlet left shoulder reviewed.  Glenohumeral arthritis is present with bone-on-bone changes noted on the axillary view.  Small inferior osteophyte noted.  Visualized lung fields clear.  Acromiohumeral distance is moderately narrowed.  Mild AC joint degenerative changes also present.  Shoulder is located.    PMFS History: Patient Active Problem  List   Diagnosis Date Noted  . PULMONARY NODULE 01/31/2010  . NECK PAIN 01/23/2010  . DYSPHAGIA UNSPECIFIED 01/23/2010  . METATARSALGIA 01/11/2009  . TROCHANTERIC BURSITIS, LEFT 11/22/2008  . INGUINAL PAIN, LEFT 11/22/2008   Past Medical History:  Diagnosis Date  . Thyroid disease     No family history on file.  Past Surgical History:  Procedure Laterality Date  . BLADDER REMOVAL    . COSMETIC SURGERY    . EYE SURGERY     Social History   Occupational History  . Not on file  Tobacco Use  . Smoking status: Former Smoker    Last attempt to quit: 09/15/1978     Years since quitting: 39.7  . Smokeless tobacco: Never Used  Substance and Sexual Activity  . Alcohol use: Yes    Comment: glass of wine daily  . Drug use: No  . Sexual activity: Never    Birth control/protection: None

## 2018-10-08 ENCOUNTER — Encounter (HOSPITAL_COMMUNITY): Payer: Self-pay | Admitting: Emergency Medicine

## 2018-10-08 ENCOUNTER — Ambulatory Visit (HOSPITAL_COMMUNITY)
Admission: EM | Admit: 2018-10-08 | Discharge: 2018-10-08 | Disposition: A | Discharge status: Home / Self Care | Payer: PPO | Attending: Internal Medicine | Admitting: Internal Medicine

## 2018-10-08 ENCOUNTER — Other Ambulatory Visit: Payer: Self-pay

## 2018-10-08 DIAGNOSIS — H9203 Otalgia, bilateral: Secondary | ICD-10-CM | POA: Diagnosis not present

## 2018-10-08 DIAGNOSIS — H9193 Unspecified hearing loss, bilateral: Secondary | ICD-10-CM | POA: Diagnosis not present

## 2018-10-08 DIAGNOSIS — H6123 Impacted cerumen, bilateral: Secondary | ICD-10-CM

## 2018-10-08 DIAGNOSIS — R42 Dizziness and giddiness: Secondary | ICD-10-CM | POA: Diagnosis not present

## 2018-10-08 MED ORDER — GUAIFENESIN ER 600 MG PO TB12: 600.0000 mg | ORAL_TABLET | Freq: Two times a day (BID) | ORAL | 0 refills | Status: DC

## 2018-10-08 NOTE — ED Provider Notes (Signed)
[facility]    CSN: [id number] Arrival date & time: 10/08/18  1404     History   Chief Complaint Chief Complaint  Patient presents with   Otalgia   Nasal Congestion    HPI VICCI REDER is a 68 y.o. female with a history of hypothyroidism comes to urgent care with complaints of bilateral ear pain with decreased hearing since last night.  Patient gives a history of nasal congestion which has been ongoing for several days.  He started having pain in the ears especially the right ear last night.  She had some ringing in the right ears and decreased hearing.  No ear discharge.  No nausea or vomiting.  She denies any relieving factors.  No aggravating factors known.  She comes to urgent care to be reevaluated.   HPI  Past Medical History:  Diagnosis Date   Thyroid disease     There are no active problems to display for this patient.   Past Surgical History:  Procedure Laterality Date   LIPOSUCTION      OB History   No obstetric history on file.      Home Medications    Prior to Admission medications   Medication Sig Start Date End Date Taking? Authorizing Provider  Estradiol 10 MCG TABS vaginal tablet  09/23/18  Yes [provider]  levothyroxine (SYNTHROID) 25 MCG tablet TK 1 T PO D 09/10/18  Yes [provider]  guaiFENesin (MUCINEX) 600 MG 12 hr tablet Take 1 tablet (600 mg total) by mouth 2 (two) times daily. 10/08/18   Emileo Semel, Myrene Galas, MD    Family History History reviewed. No pertinent family history.  Social History Social History   Tobacco Use   Smoking status: Former Smoker   Smokeless tobacco: Never Used  Substance Use Topics   Alcohol use: Yes   Drug use: Never     Allergies   Valium [diazepam]   Review of Systems Review of Systems  Constitutional: Negative.   HENT: Positive for ear pain, hearing loss, postnasal drip and sinus pressure. Negative for ear discharge, rhinorrhea, sinus pain, sore throat and  voice change.   Eyes: Negative for photophobia, pain, discharge, redness, itching and visual disturbance.  Respiratory: Negative.   Cardiovascular: Negative.   Gastrointestinal: Negative.   Neurological: Positive for dizziness. Negative for weakness, light-headedness, numbness and headaches.     Physical Exam Triage Vital Signs ED Triage Vitals  Enc Vitals Group     BP 10/08/18 1506 (!) 141/69     Pulse Rate 10/08/18 1506 86     Resp 10/08/18 1506 16     Temp 10/08/18 1506 97.9 F (36.6 C)     Temp Source 10/08/18 1506 Oral     SpO2 10/08/18 1506 100 %     Weight --      Height --      Head Circumference --      Peak Flow --      Pain Score 10/08/18 1508 2     Pain Loc --      Pain Edu? --      Excl. in [place]? --    No data found.  Updated Vital Signs BP (!) 141/69 (BP Location: Right Arm) Comment: Reported BP to Nurse Ailene Ravel Munday   Pulse 86    Temp 97.9 F (36.6 C) (Oral)    Resp 16    SpO2 100%   Visual Acuity Right Eye Distance:   Left Eye Distance:  Bilateral Distance:    Right Eye Near:   Left Eye Near:    Bilateral Near:     Physical Exam Constitutional:      Appearance: Normal appearance.  HENT:     Right Ear: External ear normal. There is impacted cerumen.     Left Ear: External ear normal. There is impacted cerumen.     Mouth/Throat:     Mouth: Mucous membranes are moist.  Cardiovascular:     Rate and Rhythm: Normal rate and regular rhythm.     Pulses: Normal pulses.     Heart sounds: Normal heart sounds.  Pulmonary:     Effort: Pulmonary effort is normal.     Breath sounds: Normal breath sounds.  Abdominal:     General: Bowel sounds are normal.     Palpations: Abdomen is soft.  Musculoskeletal: Normal range of motion.        General: No swelling or deformity.     Right lower leg: No edema.  Skin:    Capillary Refill: Capillary refill takes less than 2 seconds.  Neurological:     General: No focal deficit present.     Mental Status: She  is alert and oriented to person, place, and time.      UC Treatments / Results  Labs (all labs ordered are listed, but only abnormal results are displayed) Labs Reviewed - No data to display  EKG None  Radiology No results found.  Procedures Ear Cerumen Removal  Date/Time: 10/09/2018 6:31 PM Performed by: Chase Picket, MD Authorized by: Chase Picket, MD   Consent:    Consent obtained:  Verbal   Consent given by:  Patient   Risks discussed:  Bleeding, dizziness and incomplete removal   Alternatives discussed:  No treatment and alternative treatment Procedure details:    Location:  L ear and R ear   Procedure type: irrigation   Post-procedure details:    Inspection:  TM intact   Hearing quality:  Normal   Patient tolerance of procedure:  Tolerated well, no immediate complications   (including critical care time)  Medications Ordered in UC Medications - No data to display  Initial Impression / Assessment and Plan / UC Course  I have reviewed the triage vital signs and the nursing notes.  Pertinent labs & imaging results that were available during my care of the patient were reviewed by me and considered in my medical decision making (see chart for details).     1.  Bilateral cerumen impaction: Cerumen removal Mucinex to help with postnasal drainage If patient's symptoms worsen she can return to urgent care to be reevaluated Final Clinical Impressions(s) / UC Diagnoses   Final diagnoses:  Bilateral impacted cerumen   Discharge Instructions   None    ED Prescriptions    Medication Sig Dispense Auth. Provider   guaiFENesin (MUCINEX) 600 MG 12 hr tablet Take 1 tablet (600 mg total) by mouth 2 (two) times daily. 30 tablet Trena Dunavan, Myrene Galas, MD     Controlled Substance Prescriptions [place] Controlled Substance Registry consulted? No   Chase Picket, MD 10/09/18 [phone]

## 2018-10-08 NOTE — ED Triage Notes (Signed)
Pt reports right nasal congestion for a while and right ear pain that started last night.  Pt denies a fever or cough, but does complain of a right sided mild headache.

## 2018-10-11 ENCOUNTER — Encounter: Payer: Self-pay | Admitting: Podiatry

## 2018-10-11 ENCOUNTER — Other Ambulatory Visit: Payer: Self-pay | Admitting: Podiatry

## 2018-10-11 ENCOUNTER — Encounter (INDEPENDENT_AMBULATORY_CARE_PROVIDER_SITE_OTHER): Payer: Self-pay | Admitting: Orthopedic Surgery

## 2018-10-11 ENCOUNTER — Ambulatory Visit: Payer: PPO | Admitting: Podiatry

## 2018-10-11 ENCOUNTER — Other Ambulatory Visit: Payer: Self-pay

## 2018-10-11 ENCOUNTER — Ambulatory Visit (INDEPENDENT_AMBULATORY_CARE_PROVIDER_SITE_OTHER): Payer: PPO

## 2018-10-11 VITALS — BP 136/78 | HR 52 | Temp 99.0°F

## 2018-10-11 DIAGNOSIS — M722 Plantar fascial fibromatosis: Secondary | ICD-10-CM | POA: Diagnosis not present

## 2018-10-11 DIAGNOSIS — M79672 Pain in left foot: Secondary | ICD-10-CM

## 2018-10-11 DIAGNOSIS — M79671 Pain in right foot: Secondary | ICD-10-CM

## 2018-10-11 NOTE — Patient Instructions (Signed)

## 2018-10-12 NOTE — Progress Notes (Signed)
Subjective:   Patient ID: Susan Davila, female   DOB: 68 y.o.   MRN: [id number]   HPI Patient states she is trying to increase her activities and when she started to walk she developed a lot of pain in the bottom of both heels that is gradually become more of an issue for her.  States that she has been trying to be active as best she can and patient does not smoke and does like to be active   Review of Systems  All other systems reviewed and are negative.       Objective:  Physical Exam Vitals signs and nursing note reviewed.  Constitutional:      Appearance: She is well-developed.  Pulmonary:     Effort: Pulmonary effort is normal.  Musculoskeletal: Normal range of motion.  Skin:    General: Skin is warm.  Neurological:     Mental Status: She is alert.     Neurovascular status intact muscle strength is adequate range of motion within normal limits with patient found to have exquisite discomfort plantar aspect heel region bilateral with inflammation fluid around the medial band.  Patient is found to have good digital perfusion is well oriented x3 and has moderate depression of the arch noted.     Assessment:  2 plantar fasciitis bilateral with inflammation fluid buildup     Plan:  H&P conditions reviewed and went ahead and injected the plantar fascial bilateral after sterile prep 3 mg Kenalog 5 mg Xylocaine and applied fascial brace bilateral.  Gave instructions for physical therapy supportive shoes and reappoint to recheck  X-rays indicate small spur with no indications of stress fracture arthritis

## 2018-10-25 ENCOUNTER — Ambulatory Visit: Payer: PPO | Admitting: Podiatry

## 2018-10-25 ENCOUNTER — Encounter: Payer: Self-pay | Admitting: Podiatry

## 2018-10-25 ENCOUNTER — Other Ambulatory Visit: Payer: Self-pay

## 2018-10-25 VITALS — Temp 98.7°F

## 2018-10-25 DIAGNOSIS — M722 Plantar fascial fibromatosis: Secondary | ICD-10-CM | POA: Diagnosis not present

## 2018-10-29 NOTE — Progress Notes (Signed)
Subjective:   Patient ID: Susan Davila, female   DOB: 68 y.o.   MRN: [id number]   HPI Patient presents stating my foot is improving but the left one is still bothering me in the heel.  States it still sore when patient tries to be active but improved from previous and the right is doing very well currently   ROS      Objective:  Physical Exam  Neurovascular status intact with patient found to have continued discomfort plantar heel left over right with inflammation fluid at the insertion calcaneus     Assessment:  Acute plantar fasciitis left with inflammation fluid buildup     Plan:  Reviewed condition and continue shoe gear modification physical therapy and went ahead today and reinjected the fascia 3 mg Kenalog 5 mg Xylocaine and advised on supportive shoe gear therapy

## 2018-11-03 DIAGNOSIS — H31092 Other chorioretinal scars, left eye: Secondary | ICD-10-CM | POA: Diagnosis not present

## 2018-11-03 DIAGNOSIS — H2513 Age-related nuclear cataract, bilateral: Secondary | ICD-10-CM | POA: Diagnosis not present

## 2018-11-03 DIAGNOSIS — Z9889 Other specified postprocedural states: Secondary | ICD-10-CM | POA: Diagnosis not present

## 2018-11-22 DIAGNOSIS — H2513 Age-related nuclear cataract, bilateral: Secondary | ICD-10-CM | POA: Diagnosis not present

## 2018-12-02 DIAGNOSIS — H2511 Age-related nuclear cataract, right eye: Secondary | ICD-10-CM | POA: Diagnosis not present

## 2018-12-10 ENCOUNTER — Ambulatory Visit (HOSPITAL_COMMUNITY)
Admission: EM | Admit: 2018-12-10 | Discharge: 2018-12-10 | Disposition: A | Discharge status: Home / Self Care | Payer: PPO | Attending: Family Medicine | Admitting: Family Medicine

## 2018-12-10 ENCOUNTER — Encounter (HOSPITAL_COMMUNITY): Payer: Self-pay

## 2018-12-10 ENCOUNTER — Other Ambulatory Visit: Payer: Self-pay

## 2018-12-10 DIAGNOSIS — M25475 Effusion, left foot: Secondary | ICD-10-CM | POA: Diagnosis not present

## 2018-12-10 MED ORDER — MELOXICAM 7.5 MG PO TABS: 7.5000 mg | ORAL_TABLET | Freq: Every day | ORAL | 0 refills | Status: DC

## 2018-12-10 NOTE — ED Triage Notes (Signed)
Pt states she has swelling in her left foot. Pt states this started today.

## 2018-12-10 NOTE — ED Provider Notes (Signed)
[facility]    CSN: [id number] Arrival date & time: 12/10/18  1028      History   Chief Complaint Chief Complaint  Patient presents with  . Joint Swelling    HPI Susan Davila is a 68 y.o. female.   Susan Davila presents with complaints of left foot swelling. She noted this today at approximately 10a. Her foot was normal when she woke today. She was standing at work, she always stands to work, she she noted some pressure to her foot and saw that it was very swollen. No injury. No redness, pain, numbness or tingling. It feels tight related to the swelling but not specifically painful. She has had issues with plantar fascitis in the past and was recommended to wear supportive shoes. She was wearing flats today, although with an insert in them. Denies any previous similar. Hasn't taken any medications for symptoms. Denies any previous similar. She is ambulatory without difficulty. She has some discomfort to plantar aspect of left lateral heel.     ROS per HPI, negative if not otherwise mentioned.      Past Medical History:  Diagnosis Date  . Thyroid disease     Patient Active Problem List   Diagnosis Date Noted  . PULMONARY NODULE 01/31/2010  . NECK PAIN 01/23/2010  . DYSPHAGIA UNSPECIFIED 01/23/2010  . METATARSALGIA 01/11/2009  . TROCHANTERIC BURSITIS, LEFT 11/22/2008  . INGUINAL PAIN, LEFT 11/22/2008    Past Surgical History:  Procedure Laterality Date  . BLADDER REMOVAL    . COSMETIC SURGERY    . EYE SURGERY    . LIPOSUCTION      OB History   No obstetric history on file.      Home Medications    Prior to Admission medications   Medication Sig Start Date End Date Taking? Authorizing Provider  Estradiol 10 MCG TABS vaginal tablet  09/23/18   [provider]  guaiFENesin (MUCINEX) 600 MG 12 hr tablet Take 1 tablet (600 mg total) by mouth 2 (two) times daily. 10/08/18   LampteyMyrene Galas, MD  levothyroxine (SYNTHROID) 25 MCG tablet TK 1 T  PO D 09/10/18   [provider]  levothyroxine (SYNTHROID, LEVOTHROID) 25 MCG tablet Take 1 tablet (25 mcg total) by mouth daily. NO MORE REFILLS WITHOUT OFFICE VISIT - 2ND NOTICE 12/21/13   Collene Leyden, PA-C  meloxicam (MOBIC) 7.5 MG tablet Take 1 tablet (7.5 mg total) by mouth daily. 12/10/18   Zigmund Gottron, NP    Family History History reviewed. No pertinent family history.  Social History Social History   Tobacco Use  . Smoking status: Former Smoker    Quit date: 09/15/1978    Years since quitting: 40.2  . Smokeless tobacco: Never Used  Substance Use Topics  . Alcohol use: Yes    Comment: glass of wine daily  . Drug use: Never     Allergies   Valium [diazepam]   Review of Systems Review of Systems   Physical Exam Triage Vital Signs ED Triage Vitals  Enc Vitals Group     BP 12/10/18 1053 (!) 144/78     Pulse Rate 12/10/18 1053 68     Resp 12/10/18 1053 18     Temp 12/10/18 1053 98 F (36.7 C)     Temp Source 12/10/18 1053 Temporal     SpO2 12/10/18 1053 98 %     Weight 12/10/18 1051 160 lb (72.6 kg)     Height --  Head Circumference --      Peak Flow --      Pain Score 12/10/18 1051 3     Pain Loc --      Pain Edu? --      Excl. in [place]? --    No data found.  Updated Vital Signs BP (!) 144/78 (BP Location: Right Arm)   Pulse 68   Temp 98 F (36.7 C) (Temporal)   Resp 18   Wt 160 lb (72.6 kg)   SpO2 98%   BMI 25.06 kg/m   Physical Exam Constitutional:      General: She is not in acute distress.    Appearance: She is well-developed.  Cardiovascular:     Rate and Rhythm: Normal rate.  Pulmonary:     Effort: Pulmonary effort is normal.  Musculoskeletal:     Left foot: Normal range of motion and normal capillary refill. Swelling present. No tenderness, bony tenderness, crepitus or deformity.       Feet:     Comments: Swelling to left foot, primarily to left lateral foot surrounding lateral malleolus but does extend to dorsal  foot. No specific plantar fascia pain on exam; no redness, no warmth; full ROM of ankle and toes; strong pedal pulse; cap refill < 2 seconds ; no calf pain swelling or redness   Skin:    General: Skin is warm and dry.  Neurological:     Mental Status: She is alert and oriented to person, place, and time.      UC Treatments / Results  Labs (all labs ordered are listed, but only abnormal results are displayed) Labs Reviewed - No data to display  EKG   Radiology No results found.  Procedures Procedures (including critical care time)  Medications Ordered in UC Medications - No data to display  Initial Impression / Assessment and Plan / UC Course  I have reviewed the triage vital signs and the nursing notes.  Pertinent labs & imaging results that were available during my care of the patient were reviewed by me and considered in my medical decision making (see chart for details).     No red flag findings on exam for septic joint, does not appear likely gout either at this time. Suspect this is related to plantar fascitis with standing on her feet all day at work, often in likely inadequate footwear. Ace wrap applied here today. Ice, elevation, nsaids, compression recommended. Follow up with podiatry as needed. Return precautions provided. Patient verbalized understanding and agreeable to plan.  Ambulatory out of clinic without difficulty.    Final Clinical Impressions(s) / UC Diagnoses   Final diagnoses:  Swelling of foot joint, left     Discharge Instructions     I do not see any indication for xray here today.  It is reassuring that this is not red or painful.  We will start with supportive care for this to see if improvement occurs- elevate, ice, compress, antiinflammatory- meloxicam- daily. Take with food.  If you will be standing for long periods of time I do recommend more supportive shoes like we discussed- dansko, alegria, MBT, or Clove are ones I am familiar with.   If any worsening- increased pain, redness, swelling, numbness or tingling don't hesitate to come back for recheck.  If persistent please follow up with podiatry.    ED Prescriptions    Medication Sig Dispense Auth. Provider   meloxicam (MOBIC) 7.5 MG tablet Take 1 tablet (7.5 mg total) by mouth  daily. 20 tablet Zigmund Gottron, NP     Controlled Substance Prescriptions [place] Controlled Substance Registry consulted? Not Applicable   Zigmund Gottron, NP 12/10/18 1151

## 2018-12-10 NOTE — Discharge Instructions (Addendum)
I do not see any indication for xray here today.  It is reassuring that this is not red or painful.  We will start with supportive care for this to see if improvement occurs- elevate, ice, compress, antiinflammatory- meloxicam- daily. Take with food.  If you will be standing for long periods of time I do recommend more supportive shoes like we discussed- dansko, alegria, MBT, or Clove are ones I am familiar with.  If any worsening- increased pain, redness, swelling, numbness or tingling don't hesitate to come back for recheck.  If persistent please follow up with podiatry.

## 2018-12-23 DIAGNOSIS — H2512 Age-related nuclear cataract, left eye: Secondary | ICD-10-CM | POA: Diagnosis not present

## 2019-01-04 DIAGNOSIS — L292 Pruritus vulvae: Secondary | ICD-10-CM | POA: Diagnosis not present

## 2019-01-04 DIAGNOSIS — B373 Candidiasis of vulva and vagina: Secondary | ICD-10-CM | POA: Diagnosis not present

## 2019-01-17 ENCOUNTER — Other Ambulatory Visit: Payer: Self-pay

## 2019-01-17 ENCOUNTER — Encounter (HOSPITAL_COMMUNITY): Payer: Self-pay | Admitting: Emergency Medicine

## 2019-01-17 ENCOUNTER — Ambulatory Visit (HOSPITAL_COMMUNITY)
Admission: EM | Admit: 2019-01-17 | Discharge: 2019-01-17 | Disposition: A | Discharge status: Home / Self Care | Payer: PPO | Attending: Family Medicine | Admitting: Family Medicine

## 2019-01-17 DIAGNOSIS — R42 Dizziness and giddiness: Secondary | ICD-10-CM | POA: Diagnosis not present

## 2019-01-17 LAB — CBC WITH DIFFERENTIAL/PLATELET
Abs Immature Granulocytes: 0.01 10*3/uL (ref 0.00–0.07)
Basophils Absolute: 0 10*3/uL (ref 0.0–0.1)
Basophils Relative: 1 %
Eosinophils Absolute: 0.1 10*3/uL (ref 0.0–0.5)
Eosinophils Relative: 2 %
HCT: 41.7 % (ref 36.0–46.0)
Hemoglobin: 14.6 g/dL (ref 12.0–15.0)
Immature Granulocytes: 0 %
Lymphocytes Relative: 38 %
Lymphs Abs: 1.7 10*3/uL (ref 0.7–4.0)
MCH: 33 pg (ref 26.0–34.0)
MCHC: 35 g/dL (ref 30.0–36.0)
MCV: 94.1 fL (ref 80.0–100.0)
Monocytes Absolute: 0.4 10*3/uL (ref 0.1–1.0)
Monocytes Relative: 9 %
Neutro Abs: 2.3 10*3/uL (ref 1.7–7.7)
Neutrophils Relative %: 50 %
Platelets: 224 10*3/uL (ref 150–400)
RBC: 4.43 MIL/uL (ref 3.87–5.11)
RDW: 11.9 % (ref 11.5–15.5)
WBC: 4.6 10*3/uL (ref 4.0–10.5)
nRBC: 0 % (ref 0.0–0.2)

## 2019-01-17 LAB — BASIC METABOLIC PANEL
Anion gap: 8 (ref 5–15)
BUN: 20 mg/dL (ref 8–23)
CO2: 30 mmol/L (ref 22–32)
Calcium: 9.5 mg/dL (ref 8.9–10.3)
Chloride: 100 mmol/L (ref 98–111)
Creatinine, Ser: 0.72 mg/dL (ref 0.44–1.00)
GFR calc Af Amer: >60 mL/min (ref >60)
GFR calc non Af Amer: >60 mL/min (ref >60)
Glucose, Bld: 100 mg/dL — ABNORMAL HIGH (ref 70–99)
Potassium: 4.3 mmol/L (ref 3.5–5.1)
Sodium: 138 mmol/L (ref 135–145)

## 2019-01-17 LAB — POCT URINALYSIS DIP (DEVICE)
Bilirubin Urine: NEGATIVE
Glucose, UA: NEGATIVE mg/dL
Hgb urine dipstick: NEGATIVE
Ketones, ur: NEGATIVE mg/dL
Leukocytes,Ua: NEGATIVE
Nitrite: NEGATIVE
Protein, ur: NEGATIVE mg/dL
Specific Gravity, Urine: >=1.03 (ref 1.005–1.030)
Urobilinogen, UA: 0.2 mg/dL (ref 0.0–1.0)
pH: 6 (ref 5.0–8.0)

## 2019-01-17 NOTE — Discharge Instructions (Signed)
Your urine does indicate some dehydration, I would recommend increasing your water intake, decreasing any caffeine intake.  Do use daily flonase or nasacort  to help with your inner ear and nasal drainage.  I will call you if I find anything concerning with your lab tests.  Change positions slowly.  Continue to check in with your pcp for recheck of your BP and of any persistent symptoms.  Please go to the ER if symptoms worsen, develop headache or vision changes, vomiting, weakness, confusion or otherwise worsening.

## 2019-01-17 NOTE — ED Triage Notes (Addendum)
Pt reports dizziness when rolling over in bed and when sitting up.  She does not have issues when standing.  Laid pt on the bed to take BP and she stated she immediately started feeling dizzy.  She sat up and was dizzy as well.  Pt is able to stand without dizziness.  Pt has been on several different types of steroids over the last month or two.  Pt had bilateral cataract surgery on 12/23/2018.

## 2019-01-17 NOTE — ED Provider Notes (Signed)
[facility]    CSN: [id number] Arrival date & time: 01/17/19  1709      History   Chief Complaint Chief Complaint  Patient presents with  . Dizziness    with position changes    HPI Susan Davila is a 68 y.o. female.   Susan Davila presents with complaints of "head swimming" which started two days ago. She woke with it. It occurs when she changes positions. She notices it when she gets out of bed but once she is up moving it resolves. She also notices it when she lays down flat. No dizziness with rotation of the head when she is upright. No nausea or vomiting. No falls. No headache. No visible changes. No chest pain or palpitations. No head injury. Denies any previous similar. No recent travel. No ear pain. Has had ongoing post nasal drip and issues with her left ear, however. Ear does not hurt. No fevers. No URI symptoms. She notes that today her BP here in clinic is higher than normal for her. Cataract surgery last month, treatment for foot pain last month as well. Recent treatment for vaginal yeast infection.     ROS per HPI, negative if not otherwise mentioned.      Past Medical History:  Diagnosis Date  . Thyroid disease     Patient Active Problem List   Diagnosis Date Noted  . PULMONARY NODULE 01/31/2010  . NECK PAIN 01/23/2010  . DYSPHAGIA UNSPECIFIED 01/23/2010  . METATARSALGIA 01/11/2009  . TROCHANTERIC BURSITIS, LEFT 11/22/2008  . INGUINAL PAIN, LEFT 11/22/2008    Past Surgical History:  Procedure Laterality Date  . BLADDER REMOVAL    . COSMETIC SURGERY    . EYE SURGERY    . LIPOSUCTION      OB History   No obstetric history on file.      Home Medications    Prior to Admission medications   Medication Sig Start Date End Date Taking? Authorizing Provider  clobetasol ointment (TEMOVATE) 0.05 % APP EXT AA HS 01/04/19  Yes [provider]  ketorolac (ACULAR) 0.5 % ophthalmic solution INT 1 GTT IN OD QID STARTING 1 DAY B SURGERY  01/05/19  Yes [provider]  levothyroxine (SYNTHROID) 25 MCG tablet TK 1 T PO D 09/10/18  Yes [provider]  levothyroxine (SYNTHROID, LEVOTHROID) 25 MCG tablet Take 1 tablet (25 mcg total) by mouth daily. NO MORE REFILLS WITHOUT OFFICE VISIT - 2ND NOTICE 12/21/13  Yes Marte, Heather M, PA-C  ofloxacin (OCUFLOX) 0.3 % ophthalmic solution INT 1 GTT IN OD QID STARTING 1 DAY B SURGERY 11/23/18  Yes [provider]  prednisoLONE acetate (PRED FORTE) 1 % ophthalmic suspension SHAKE LQ AND INT 1 GTT IN OD QID STARTING AFTER SURGERY 11/23/18  Yes [provider]  Estradiol 10 MCG TABS vaginal tablet  09/23/18   [provider]  guaiFENesin (MUCINEX) 600 MG 12 hr tablet Take 1 tablet (600 mg total) by mouth 2 (two) times daily. 10/08/18   Lamptey, Myrene Galas, MD  meloxicam (MOBIC) 7.5 MG tablet Take 1 tablet (7.5 mg total) by mouth daily. 12/10/18   Zigmund Gottron, NP    Family History History reviewed. No pertinent family history.  Social History Social History   Tobacco Use  . Smoking status: Former Smoker    Quit date: 09/15/1978    Years since quitting: 40.3  . Smokeless tobacco: Never Used  Substance Use Topics  . Alcohol use: Yes  Comment: glass of wine daily  . Drug use: Never     Allergies   Valium [diazepam]   Review of Systems Review of Systems   Physical Exam Triage Vital Signs ED Triage Vitals  Enc Vitals Group     BP --      Pulse --      Resp 01/17/19 1757 12     Temp 01/17/19 1757 98.1 F (36.7 C)     Temp Source 01/17/19 1757 Oral     SpO2 01/17/19 1757 100 %     Weight --      Height --      Head Circumference --      Peak Flow --      Pain Score 01/17/19 1910 0     Pain Loc --      Pain Edu? --      Excl. in [place]? --    Orthostatic VS for the past 24 hrs:  BP- Lying Pulse- Lying BP- Sitting Pulse- Sitting BP- Standing at 0 minutes Pulse- Standing at 0 minutes  01/17/19 1752 142/86 66 156/87 64 143/83 62     Updated Vital Signs Temp 98.1 F (36.7 C) (Oral)   Resp 12   SpO2 100%    Physical Exam Constitutional:      General: She is not in acute distress.    Appearance: She is well-developed.  HENT:     Head: Normocephalic and atraumatic.     Right Ear: Tympanic membrane and ear canal normal.     Left Ear: Tympanic membrane and ear canal normal.     Ears:     Comments: Left tm is partially occluded by cerumen     Nose: Nose normal.     Mouth/Throat:     Mouth: Mucous membranes are moist.  Eyes:     General: Lids are normal. Vision grossly intact. Gaze aligned appropriately.     Extraocular Movements: Extraocular movements intact.     Right eye: No nystagmus.     Left eye: No nystagmus.     Pupils: Pupils are equal, round, and reactive to light.  Neck:     Musculoskeletal: Normal range of motion.  Cardiovascular:     Rate and Rhythm: Normal rate and regular rhythm.     Heart sounds: Normal heart sounds.  Pulmonary:     Effort: Pulmonary effort is normal.     Breath sounds: Normal breath sounds.  Skin:    General: Skin is warm and dry.  Neurological:     General: No focal deficit present.     Mental Status: She is alert and oriented to person, place, and time. Mental status is at baseline.     Cranial Nerves: No cranial nerve deficit.     Sensory: No sensory deficit.     Motor: No weakness.     Comments: Patient with "swimming" sensation with laying flat which resolves after approximately 15 seconds after change in position; returns with sitting up and again resolves approximately 15 seconds after upright    EKG:  Sinus bradycardia . Previous EKG was not available for review. No stwave changes as interpreted by me.    UC Treatments / Results  Labs (all labs ordered are listed, but only abnormal results are displayed) Labs Reviewed  BASIC METABOLIC PANEL - Abnormal; Notable for the following components:      Result Value   Glucose, Bld 100 (*)    All other components  within normal limits  CBC WITH DIFFERENTIAL/PLATELET  POCT URINALYSIS DIP (DEVICE)    EKG   Radiology No results found.  Procedures Procedures (including critical care time)  Medications Ordered in UC Medications - No data to display  Initial Impression / Assessment and Plan / UC Course  I have reviewed the triage vital signs and the nursing notes.  Pertinent labs & imaging results that were available during my care of the patient were reviewed by me and considered in my medical decision making (see chart for details).     CBC, BMP unremarkable. ekg without acute changes, although note bradycardia. Not present during orthostatic vitals and patient with symptoms however. No significant vital changes with position changes to indicate source of symptoms. Urine SG is elevated. This appears consistent with positional vertigo. Do recommend daily nasal spray, plenty of fluids. Patient states she is unable to tolerate any antihistamines, as meclizine discussed. She overall feels well except for brief episodes, therefore declines use of meclizine at this time. encouraged follow up with PCP. Return precautions provided. Patient verbalized understanding and agreeable to plan. Ambulatory out of clinic without difficulty.    Final Clinical Impressions(s) / UC Diagnoses   Final diagnoses:  Vertigo     Discharge Instructions     Your urine does indicate some dehydration, I would recommend increasing your water intake, decreasing any caffeine intake.  Do use daily flonase or nasacort  to help with your inner ear and nasal drainage.  I will call you if I find anything concerning with your lab tests.  Change positions slowly.  Continue to check in with your pcp for recheck of your BP and of any persistent symptoms.  Please go to the ER if symptoms worsen, develop headache or vision changes, vomiting, weakness, confusion or otherwise worsening.    ED Prescriptions    None     PDMP not  reviewed this encounter.   Zigmund Gottron, NP 01/17/19 1926

## 2019-02-09 DIAGNOSIS — L292 Pruritus vulvae: Secondary | ICD-10-CM | POA: Diagnosis not present

## 2019-03-21 ENCOUNTER — Telehealth: Payer: Self-pay | Admitting: Orthopedic Surgery

## 2019-03-21 NOTE — Telephone Encounter (Signed)
Patient called and left message on vm. Called patient and voice mail not set up. Couldn't leave a nessage

## 2019-03-22 ENCOUNTER — Telehealth: Payer: Self-pay | Admitting: Orthopedic Surgery

## 2019-03-22 NOTE — Telephone Encounter (Signed)
Returned call to patient got recording voicemail have not been set up yet    Could not leave message      Pt need an appointment with Dr Marlou Sa per patient request    [phone]

## 2019-03-30 ENCOUNTER — Other Ambulatory Visit: Payer: Self-pay

## 2019-03-30 ENCOUNTER — Ambulatory Visit (INDEPENDENT_AMBULATORY_CARE_PROVIDER_SITE_OTHER): Payer: PPO | Admitting: Orthopedic Surgery

## 2019-03-30 ENCOUNTER — Encounter: Payer: Self-pay | Admitting: Orthopedic Surgery

## 2019-03-30 DIAGNOSIS — M19012 Primary osteoarthritis, left shoulder: Secondary | ICD-10-CM

## 2019-04-01 NOTE — Progress Notes (Signed)
Office Visit Note   Patient: Susan Davila           Date of Birth: [birth year]           MRN: [id number] Visit Date: 03/30/2019 Requested by: Brien Few, MD [address] [place],  [place] [address] PCP: Brien Few, MD  Subjective: Chief Complaint  Patient presents with  . Left Shoulder - Pain    HPI: Susan Davila is a 68 y.o. female who presents to the office complaining of left shoulder pain.  Patient notes that she has been experiencing left shoulder pain that is worse at night.  She notes radiation down the left arm to the left elbow.  It keeps her up at night.  She denies any burning pain, neck pain, numbness/tingling.  She has been trying to control her pain with ibuprofen and ice with some relief.  She has never had a shoulder injection before.  She denies any significant weakness or stiffness..                ROS:  All systems reviewed are negative as they relate to the chief complaint within the history of present illness.  Patient denies fevers or chills.  Assessment & Plan: Visit Diagnoses:  1. Primary osteoarthritis of left shoulder     Plan: Patient is a 68 year old female who presents complaining of left shoulder pain.  She has been seen previously for left shoulder arthritis.  She has no new complaints of weakness or injury since that last office visit.  Radiographs from February do show glenohumeral joint arthritis with small humeral head osteophyte she has good functional range of motion and strength on exam.  Discussed options available to the patient including doing nothing versus anti-inflammatories versus cortisone injection versus surgery.  Recommended the patient try cortisone injection to see how much relief that provides.  She has never tried 1 before.  Patient agreed with plan.  Patient tolerated the left shoulder injection well and will follow up with the office as needed.  Follow-Up Instructions: No follow-ups on file.   Orders:  No orders of  the defined types were placed in this encounter.  No orders of the defined types were placed in this encounter.     Procedures: Large Joint Inj: L glenohumeral on 04/02/2019 8:47 AM Indications: diagnostic evaluation and pain Details: 18 G 1.5 in needle, posterior approach  Arthrogram: No  Medications: 9 mL bupivacaine 0.5 %; 40 mg methylPREDNISolone acetate 40 MG/ML; 5 mL lidocaine 1 % Outcome: tolerated well, no immediate complications Procedure, treatment alternatives, risks and benefits explained, specific risks discussed. Consent was given by the patient. Immediately prior to procedure a time out was called to verify the correct patient, procedure, equipment, support staff and site/side marked as required. Patient was prepped and draped in the usual sterile fashion.       Clinical Data: No additional findings.  Objective: Vital Signs: There were no vitals taken for this visit.  Physical Exam:  Constitutional: Patient appears well-developed HEENT:  Head: Normocephalic Eyes:EOM are normal Neck: Normal range of motion Cardiovascular: Normal rate Pulmonary/chest: Effort normal Neurologic: Patient is alert Skin: Skin is warm Psychiatric: Patient has normal mood and affect  Ortho Exam:  Left shoulder Exam Able to fully forward flex and abduct shoulder overhead Good endpoint with ER No TTP over the [facility] joint Good subscapularis, supraspinatus, and infraspinatus strength 5/5 grip strength, forearm pronation/supination, and bicep strength  Specialty Comments:  No specialty comments available.  Imaging: No results found.   PMFS History: Patient Active Problem List   Diagnosis Date Noted  . PULMONARY NODULE 01/31/2010  . NECK PAIN 01/23/2010  . DYSPHAGIA UNSPECIFIED 01/23/2010  . METATARSALGIA 01/11/2009  . TROCHANTERIC BURSITIS, LEFT 11/22/2008  . INGUINAL PAIN, LEFT 11/22/2008   Past Medical History:  Diagnosis Date  . Thyroid disease     History  reviewed. No pertinent family history.  Past Surgical History:  Procedure Laterality Date  . BLADDER REMOVAL    . COSMETIC SURGERY    . EYE SURGERY    . LIPOSUCTION     Social History   Occupational History  . Not on file  Tobacco Use  . Smoking status: Former Smoker    Quit date: 09/15/1978    Years since quitting: 40.5  . Smokeless tobacco: Never Used  Substance and Sexual Activity  . Alcohol use: Yes    Comment: glass of wine daily  . Drug use: Never  . Sexual activity: Never    Birth control/protection: None

## 2019-04-02 ENCOUNTER — Encounter: Payer: Self-pay | Admitting: Orthopedic Surgery

## 2019-04-02 DIAGNOSIS — M19012 Primary osteoarthritis, left shoulder: Secondary | ICD-10-CM

## 2019-04-02 MED ORDER — METHYLPREDNISOLONE ACETATE 40 MG/ML IJ SUSP
40.0000 mg | INTRAMUSCULAR | Status: AC | PRN
  Administered 2019-04-02: 09:00:00 40 mg via INTRA_ARTICULAR

## 2019-04-02 MED ORDER — BUPIVACAINE HCL 0.5 % IJ SOLN
9.0000 mL | INTRAMUSCULAR | Status: AC | PRN
  Administered 2019-04-02: 9 mL via INTRA_ARTICULAR

## 2019-04-02 MED ORDER — LIDOCAINE HCL 1 % IJ SOLN
5.0000 mL | INTRAMUSCULAR | Status: AC | PRN
  Administered 2019-04-02: 5 mL

## 2019-04-25 ENCOUNTER — Other Ambulatory Visit: Payer: Self-pay

## 2019-04-25 ENCOUNTER — Encounter: Payer: Self-pay | Admitting: Podiatry

## 2019-04-25 ENCOUNTER — Ambulatory Visit (INDEPENDENT_AMBULATORY_CARE_PROVIDER_SITE_OTHER): Payer: PPO

## 2019-04-25 ENCOUNTER — Ambulatory Visit (INDEPENDENT_AMBULATORY_CARE_PROVIDER_SITE_OTHER): Payer: PPO | Admitting: Podiatry

## 2019-04-25 DIAGNOSIS — D3610 Benign neoplasm of peripheral nerves and autonomic nervous system, unspecified: Secondary | ICD-10-CM

## 2019-04-25 DIAGNOSIS — M722 Plantar fascial fibromatosis: Secondary | ICD-10-CM

## 2019-04-25 DIAGNOSIS — G5763 Lesion of plantar nerve, bilateral lower limbs: Secondary | ICD-10-CM

## 2019-04-25 DIAGNOSIS — M7672 Peroneal tendinitis, left leg: Secondary | ICD-10-CM

## 2019-04-25 DIAGNOSIS — R001 Bradycardia, unspecified: Secondary | ICD-10-CM | POA: Diagnosis not present

## 2019-04-25 NOTE — Progress Notes (Signed)
Subjective:   Patient ID: Susan Davila, female   DOB: 69 y.o.   MRN: [id number]   HPI Patient presents stating she is getting some pain in the outside of her left foot and has had history of heel pain bilateral she wants to discuss.  Patient states she also has some swelling and numbing-like sensations in her left foot with occasional tingling   ROS      Objective:  Physical Exam  Neurovascular status unchanged with patient found to have quite a bit of discomfort around the peroneal tendon left and upon checking circulatory status possibility that she may have an irregular heart rate which I am sending her to her physician for.  I also noted mild discomfort plantar fascial bilateral     Assessment:  Several different problems with 1 being improving fasciitis what appears to be acute peroneal tendinitis with some mild swelling and the possibility for low-grade neuropathy or irregular heart rate     Plan:  H&P all conditions reviewed and today I did sterile prep and injected the lateral perineal area 3 mg Dexasone Kenalog 5 mg Xylocaine and applied compression stocking and reappoint 3 weeks and I sent her off for EKG to be evaluated.  Reappoint for [place] to recheck  X-rays indicate no indication of stress fracture or acute arthritic process

## 2019-05-16 ENCOUNTER — Other Ambulatory Visit: Payer: Self-pay

## 2019-05-16 ENCOUNTER — Encounter: Payer: Self-pay | Admitting: Podiatry

## 2019-05-16 ENCOUNTER — Ambulatory Visit (INDEPENDENT_AMBULATORY_CARE_PROVIDER_SITE_OTHER): Payer: PPO | Admitting: Podiatry

## 2019-05-16 VITALS — Temp 97.2°F

## 2019-05-16 DIAGNOSIS — M7672 Peroneal tendinitis, left leg: Secondary | ICD-10-CM

## 2019-05-16 DIAGNOSIS — M722 Plantar fascial fibromatosis: Secondary | ICD-10-CM | POA: Diagnosis not present

## 2019-05-19 NOTE — Progress Notes (Signed)
Subjective:   Patient ID: Susan Davila, female   DOB: 69 y.o.   MRN: [id number]   HPI Patient states improving quite a bit and states that the nerve feels better and the arch feels better and the knot seems to have improved   ROS      Objective:  Physical Exam  Neurovascular status intact with patient's left fascia improved quite a bit with pain only upon deep palpation but better with patient walking with a better heel toe gait     Assessment:  Improvement of fasciitis-like symptomatology left with patient continuing to take oral medications using brace and other modalities F2     Plan:  H&P reviewed condition and recommended the continuation of anti-inflammatories physical therapy and supportive shoe gear usage.  Reviewed further treatments we may need to obtain depending on how it does and patient will be seen back as needed

## 2019-05-26 ENCOUNTER — Telehealth: Payer: Self-pay | Admitting: Internal Medicine

## 2019-05-26 NOTE — Telephone Encounter (Signed)
  Susan Davila calling to request new patient appointment. Her husband is a patient [phone]. Can you take her as a new patient?  Please advise

## 2019-07-13 ENCOUNTER — Ambulatory Visit (INDEPENDENT_AMBULATORY_CARE_PROVIDER_SITE_OTHER): Payer: PPO | Admitting: Internal Medicine

## 2019-07-13 ENCOUNTER — Encounter: Payer: Self-pay | Admitting: Internal Medicine

## 2019-07-13 ENCOUNTER — Other Ambulatory Visit: Payer: Self-pay

## 2019-07-13 VITALS — BP 128/80 | HR 91 | Temp 98.0°F | Resp 16 | Ht 68.0 in | Wt 164.2 lb

## 2019-07-13 DIAGNOSIS — Z1159 Encounter for screening for other viral diseases: Secondary | ICD-10-CM

## 2019-07-13 DIAGNOSIS — Z1211 Encounter for screening for malignant neoplasm of colon: Secondary | ICD-10-CM

## 2019-07-13 DIAGNOSIS — Z0000 Encounter for general adult medical examination without abnormal findings: Secondary | ICD-10-CM | POA: Diagnosis not present

## 2019-07-13 DIAGNOSIS — E039 Hypothyroidism, unspecified: Secondary | ICD-10-CM | POA: Diagnosis not present

## 2019-07-13 DIAGNOSIS — E785 Hyperlipidemia, unspecified: Secondary | ICD-10-CM | POA: Diagnosis not present

## 2019-07-13 LAB — LIPID PANEL
Cholesterol: 196 mg/dL (ref 0–200)
HDL: 93.5 mg/dL
LDL Cholesterol: 91 mg/dL (ref 0–99)
NonHDL: 102.06
Total CHOL/HDL Ratio: 2
Triglycerides: 56 mg/dL (ref 0.0–149.0)
VLDL: 11.2 mg/dL (ref 0.0–40.0)

## 2019-07-13 LAB — CBC WITH DIFFERENTIAL/PLATELET
Basophils Absolute: 0 10*3/uL (ref 0.0–0.1)
Basophils Relative: 1.1 % (ref 0.0–3.0)
Eosinophils Absolute: 0.1 10*3/uL (ref 0.0–0.7)
Eosinophils Relative: 2.4 % (ref 0.0–5.0)
HCT: 42.8 % (ref 36.0–46.0)
Hemoglobin: 14.6 g/dL (ref 12.0–15.0)
Lymphocytes Relative: 41.1 % (ref 12.0–46.0)
Lymphs Abs: 1.7 10*3/uL (ref 0.7–4.0)
MCHC: 34.1 g/dL (ref 30.0–36.0)
MCV: 94.9 fl (ref 78.0–100.0)
Monocytes Absolute: 0.5 10*3/uL (ref 0.1–1.0)
Monocytes Relative: 11 % (ref 3.0–12.0)
Neutro Abs: 1.8 10*3/uL (ref 1.4–7.7)
Neutrophils Relative %: 44.4 % (ref 43.0–77.0)
Platelets: 222 10*3/uL (ref 150.0–400.0)
RBC: 4.51 Mil/uL (ref 3.87–5.11)
RDW: 13.3 % (ref 11.5–15.5)
WBC: 4.1 10*3/uL (ref 4.0–10.5)

## 2019-07-13 LAB — HEPATIC FUNCTION PANEL
ALT: 15 U/L (ref 0–35)
AST: 16 U/L (ref 0–37)
Albumin: 4.7 g/dL (ref 3.5–5.2)
Alkaline Phosphatase: 53 U/L (ref 39–117)
Bilirubin, Direct: 0.1 mg/dL (ref 0.0–0.3)
Total Bilirubin: 0.5 mg/dL (ref 0.2–1.2)
Total Protein: 7 g/dL (ref 6.0–8.3)

## 2019-07-13 LAB — BASIC METABOLIC PANEL
BUN: 28 mg/dL — ABNORMAL HIGH (ref 6–23)
CO2: 32 mEq/L (ref 19–32)
Calcium: 9.6 mg/dL (ref 8.4–10.5)
Chloride: 101 mEq/L (ref 96–112)
Creatinine, Ser: 0.74 mg/dL (ref 0.40–1.20)
GFR: 78.2 mL/min (ref >60.00)
Glucose, Bld: 101 mg/dL — ABNORMAL HIGH (ref 70–99)
Potassium: 4.6 mEq/L (ref 3.5–5.1)
Sodium: 139 mEq/L (ref 135–145)

## 2019-07-13 LAB — TSH: TSH: 1.22 u[IU]/mL (ref 0.35–4.50)

## 2019-07-13 NOTE — Patient Instructions (Signed)
Health Maintenance, Female Adopting a healthy lifestyle and getting preventive care are important in promoting health and wellness. Ask your health care provider about:  The right schedule for you to have regular tests and exams.  Things you can do on your own to prevent diseases and keep yourself healthy. What should I know about diet, weight, and exercise? Eat a healthy diet   Eat a diet that includes plenty of vegetables, fruits, low-fat dairy products, and lean protein.  Do not eat a lot of foods that are high in solid fats, added sugars, or sodium. Maintain a healthy weight Body mass index (BMI) is used to identify weight problems. It estimates body fat based on height and weight. Your health care provider can help determine your BMI and help you achieve or maintain a healthy weight. Get regular exercise Get regular exercise. This is one of the most important things you can do for your health. Most adults should:  Exercise for at least 150 minutes each week. The exercise should increase your heart rate and make you sweat (moderate-intensity exercise).  Do strengthening exercises at least twice a week. This is in addition to the moderate-intensity exercise.  Spend less time sitting. Even light physical activity can be beneficial. Watch cholesterol and blood lipids Have your blood tested for lipids and cholesterol at 68 years of age, then have this test every 5 years. Have your cholesterol levels checked more often if:  Your lipid or cholesterol levels are high.  You are older than 68 years of age.  You are at high risk for heart disease. What should I know about cancer screening? Depending on your health history and family history, you may need to have cancer screening at various ages. This may include screening for:  Breast cancer.  Cervical cancer.  Colorectal cancer.  Skin cancer.  Lung cancer. What should I know about heart disease, diabetes, and high blood  pressure? Blood pressure and heart disease  High blood pressure causes heart disease and increases the risk of stroke. This is more likely to develop in people who have high blood pressure readings, are of African descent, or are overweight.  Have your blood pressure checked: ? Every 3-5 years if you are 18-39 years of age. ? Every year if you are 40 years old or older. Diabetes Have regular diabetes screenings. This checks your fasting blood sugar level. Have the screening done:  Once every three years after age 40 if you are at a normal weight and have a low risk for diabetes.  More often and at a younger age if you are overweight or have a high risk for diabetes. What should I know about preventing infection? Hepatitis B If you have a higher risk for hepatitis B, you should be screened for this virus. Talk with your health care provider to find out if you are at risk for hepatitis B infection. Hepatitis C Testing is recommended for:  Everyone born from 1945 through 1965.  Anyone with known risk factors for hepatitis C. Sexually transmitted infections (STIs)  Get screened for STIs, including gonorrhea and chlamydia, if: ? You are sexually active and are younger than 68 years of age. ? You are older than 68 years of age and your health care provider tells you that you are at risk for this type of infection. ? Your sexual activity has changed since you were last screened, and you are at increased risk for chlamydia or gonorrhea. Ask your health care provider if   you are at risk.  Ask your health care provider about whether you are at high risk for HIV. Your health care provider may recommend a prescription medicine to help prevent HIV infection. If you choose to take medicine to prevent HIV, you should first get tested for HIV. You should then be tested every 3 months for as long as you are taking the medicine. Pregnancy  If you are about to stop having your period (premenopausal) and  you may become pregnant, seek counseling before you get pregnant.  Take 400 to 800 micrograms (mcg) of folic acid every day if you become pregnant.  Ask for birth control (contraception) if you want to prevent pregnancy. Osteoporosis and menopause Osteoporosis is a disease in which the bones lose minerals and strength with aging. This can result in bone fractures. If you are 65 years old or older, or if you are at risk for osteoporosis and fractures, ask your health care provider if you should:  Be screened for bone loss.  Take a calcium or vitamin D supplement to lower your risk of fractures.  Be given hormone replacement therapy (HRT) to treat symptoms of menopause. Follow these instructions at home: Lifestyle  Do not use any products that contain nicotine or tobacco, such as cigarettes, e-cigarettes, and chewing tobacco. If you need help quitting, ask your health care provider.  Do not use street drugs.  Do not share needles.  Ask your health care provider for help if you need support or information about quitting drugs. Alcohol use  Do not drink alcohol if: ? Your health care provider tells you not to drink. ? You are pregnant, may be pregnant, or are planning to become pregnant.  If you drink alcohol: ? Limit how much you use to 0-1 drink a day. ? Limit intake if you are breastfeeding.  Be aware of how much alcohol is in your drink. In the [place] one drink equals one 12 oz bottle of beer (355 mL), one 5 oz glass of wine (148 mL), or one 1 oz glass of hard liquor (44 mL). General instructions  Schedule regular health, dental, and eye exams.  Stay current with your vaccines.  Tell your health care provider if: ? You often feel depressed. ? You have ever been abused or do not feel safe at home. Summary  Adopting a healthy lifestyle and getting preventive care are important in promoting health and wellness.  Follow your health care provider's instructions about healthy  diet, exercising, and getting tested or screened for diseases.  Follow your health care provider's instructions on monitoring your cholesterol and blood pressure. This information is not intended to replace advice given to you by your health care provider. Make sure you discuss any questions you have with your health care provider. Document Revised: 03/31/2018 Document Reviewed: 03/31/2018 Elsevier Patient Education  2020 Elsevier Inc.  

## 2019-07-13 NOTE — Progress Notes (Signed)
Subjective:  Patient ID: Susan Davila, female    DOB: 1950/05/16  Age: 69 y.o. MRN: [id number]  CC: Annual Exam and Hypothyroidism  This visit occurred during the SARS-CoV-2 public health emergency.  Safety protocols were in place, including screening questions prior to the visit, additional usage of staff PPE, and extensive cleaning of exam room while observing appropriate contact time as indicated for disinfecting solutions.   NEW TO ME  HPI Susan Davila presents for a CPX.  She has felt well recently and offers no complaints.  She walks quite a bit and denies any recent episodes of CP, DOE, palpitations, edema, or fatigue.  History Susan Davila has a past medical history of Thyroid disease.   She has a past surgical history that includes Cosmetic surgery; Bladder removal; Eye surgery; and Liposuction.   Her family history includes Alcohol abuse (age of onset: 61) in her father; Dementia (age of onset: 35) in her mother.She reports that she quit smoking about 40 years ago. She has never used smokeless tobacco. She reports current alcohol use of about 30.0 standard drinks of alcohol per week. She reports that she does not use drugs.  Outpatient Medications Prior to Visit  Medication Sig Dispense Refill  . levothyroxine (SYNTHROID) 25 MCG tablet TK 1 T PO D    . clobetasol ointment (TEMOVATE) 0.05 % APP EXT AA HS    . Estradiol 10 MCG TABS vaginal tablet     . guaiFENesin (MUCINEX) 600 MG 12 hr tablet Take 1 tablet (600 mg total) by mouth 2 (two) times daily. 30 tablet 0  . ketorolac (ACULAR) 0.5 % ophthalmic solution INT 1 GTT IN OD QID STARTING 1 DAY B SURGERY    . levothyroxine (SYNTHROID, LEVOTHROID) 25 MCG tablet Take 1 tablet (25 mcg total) by mouth daily. NO MORE REFILLS WITHOUT OFFICE VISIT - 2ND NOTICE 15 tablet 0  . meloxicam (MOBIC) 7.5 MG tablet Take 1 tablet (7.5 mg total) by mouth daily. 20 tablet 0  . ofloxacin (OCUFLOX) 0.3 % ophthalmic solution INT 1 GTT IN OD QID STARTING  1 DAY B SURGERY    . prednisoLONE acetate (PRED FORTE) 1 % ophthalmic suspension SHAKE LQ AND INT 1 GTT IN OD QID STARTING AFTER SURGERY     No facility-administered medications prior to visit.    ROS Review of Systems  Constitutional: Negative for appetite change, diaphoresis, fatigue and unexpected weight change.  HENT: Negative.   Eyes: Negative for visual disturbance.  Respiratory: Negative for cough, chest tightness, shortness of breath and wheezing.   Cardiovascular: Negative for chest pain, palpitations and leg swelling.  Gastrointestinal: Negative for abdominal pain, constipation, diarrhea, nausea and vomiting.  Endocrine: Negative for cold intolerance and heat intolerance.  Genitourinary: Negative.  Negative for difficulty urinating.  Musculoskeletal: Negative.  Negative for arthralgias.  Skin: Negative.   Neurological: Negative.  Negative for dizziness, weakness, light-headedness and headaches.  Hematological: Negative for adenopathy. Does not bruise/bleed easily.  Psychiatric/Behavioral: Negative.     Objective:  BP 128/80 (BP Location: Left Arm, Patient Position: Sitting, Cuff Size: Normal)   Pulse 91   Temp 98 F (36.7 C) (Oral)   Resp 16   Ht 5\' 8"  (1.727 m)   Wt 164 lb 4 oz (74.5 kg)   SpO2 98%   BMI 24.97 kg/m   Physical Exam Vitals reviewed.  Constitutional:      Appearance: Normal appearance.  HENT:     Nose: Nose normal.     Mouth/Throat:  Mouth: Mucous membranes are moist.     Pharynx: No oropharyngeal exudate.  Eyes:     General: No scleral icterus.    Conjunctiva/sclera: Conjunctivae normal.  Cardiovascular:     Rate and Rhythm: Normal rate and regular rhythm.     Heart sounds: No murmur.  Pulmonary:     Effort: Pulmonary effort is normal.     Breath sounds: No stridor. No wheezing, rhonchi or rales.  Abdominal:     General: Abdomen is flat. Bowel sounds are normal. There is no distension.     Palpations: Abdomen is soft. There is no  hepatomegaly, splenomegaly or mass.     Tenderness: There is no abdominal tenderness.  Musculoskeletal:        General: Normal range of motion.     Cervical back: Neck supple.     Right lower leg: No edema.     Left lower leg: No edema.  Lymphadenopathy:     Cervical: No cervical adenopathy.  Skin:    General: Skin is warm and dry.     Coloration: Skin is not pale.  Neurological:     General: No focal deficit present.     Mental Status: She is alert.  Psychiatric:        Mood and Affect: Mood normal.        Behavior: Behavior normal.     Lab Results  Component Value Date   WBC 4.1 07/13/2019   HGB 14.6 07/13/2019   HCT 42.8 07/13/2019   PLT 222.0 07/13/2019   GLUCOSE 101 (H) 07/13/2019   CHOL 196 07/13/2019   TRIG 56.0 07/13/2019   HDL 93.50 07/13/2019   LDLCALC 91 07/13/2019   ALT 15 07/13/2019   AST 16 07/13/2019   NA 139 07/13/2019   K 4.6 07/13/2019   CL 101 07/13/2019   CREATININE 0.74 07/13/2019   BUN 28 (H) 07/13/2019   CO2 32 07/13/2019   TSH 1.22 07/13/2019    Assessment & Plan:   Susan Davila was seen today for annual exam and hypothyroidism.  Diagnoses and all orders for this visit:  Routine general medical examination at a health care facility- Exam completed, labs reviewed, no vaccines were given today so as to not interfere with COVID-19 vaccinations, she is referred for colon cancer screening, cervical cancer screening is up-to-date, she has an appointment with her gynecologist soon to be screened for breast cancer, patient education material was given.  Acquired hypothyroidism- Her TSH is in the normal range.  She will remain on the current dose of levothyroxine. -     TSH; Future -     Basic metabolic panel; Future -     CBC with Differential/Platelet; Future -     CBC with Differential/Platelet -     Basic metabolic panel -     TSH  Hyperlipidemia LDL goal <130- She does not have an elevated ASCVD risk score so I did not recommend a statin for CV  risk reduction. -     Hepatic function panel; Future -     Lipid panel; Future -     Basic metabolic panel; Future -     Basic metabolic panel -     Lipid panel -     Hepatic function panel  Need for hepatitis C screening test -     Hepatitis C antibody; Future -     Hepatitis C antibody   I have discontinued Susan Davila's meloxicam, clobetasol ointment, ofloxacin, ketorolac, prednisoLONE acetate, Estradiol,  and guaiFENesin. I am also having her maintain her levothyroxine.  No orders of the defined types were placed in this encounter.    Follow-up: Return in about 6 months (around 01/13/2020).  Scarlette Calico, MD

## 2019-07-14 ENCOUNTER — Encounter: Payer: Self-pay | Admitting: Internal Medicine

## 2019-07-14 LAB — HEPATITIS C ANTIBODY
Hepatitis C Ab: NONREACTIVE
SIGNAL TO CUT-OFF: 0.01 (ref <1.00)

## 2019-08-15 ENCOUNTER — Other Ambulatory Visit: Payer: Self-pay

## 2019-08-15 ENCOUNTER — Ambulatory Visit (INDEPENDENT_AMBULATORY_CARE_PROVIDER_SITE_OTHER): Payer: PPO | Admitting: Orthopedic Surgery

## 2019-08-15 DIAGNOSIS — M19012 Primary osteoarthritis, left shoulder: Secondary | ICD-10-CM

## 2019-08-17 ENCOUNTER — Encounter: Payer: Self-pay | Admitting: Orthopedic Surgery

## 2019-08-17 DIAGNOSIS — M19012 Primary osteoarthritis, left shoulder: Secondary | ICD-10-CM

## 2019-08-17 MED ORDER — LIDOCAINE HCL 1 % IJ SOLN
5.0000 mL | INTRAMUSCULAR | Status: AC | PRN
  Administered 2019-08-17: 22:00:00 5 mL

## 2019-08-17 MED ORDER — METHYLPREDNISOLONE ACETATE 40 MG/ML IJ SUSP
40.0000 mg | INTRAMUSCULAR | Status: AC | PRN
  Administered 2019-08-17: 40 mg via INTRA_ARTICULAR

## 2019-08-17 MED ORDER — BUPIVACAINE HCL 0.5 % IJ SOLN
9.0000 mL | INTRAMUSCULAR | Status: AC | PRN
  Administered 2019-08-17: 22:00:00 9 mL via INTRA_ARTICULAR

## 2019-08-17 NOTE — Progress Notes (Signed)
Office Visit Note   Patient: Susan Davila           Date of Birth: 1951/03/17           MRN: [id number] Visit Date: 08/15/2019 Requested by: Janith Lima, MD [address] [place],  [place] [address] PCP: Janith Lima, MD  Subjective: Chief Complaint  Patient presents with  . Left Shoulder - Follow-up    HPI: Susan Davila is a patient with known left shoulder glenohumeral arthritis.  Last injection 03/30/2019.  They gave her pretty good relief.  She has been doing some exercises.  Pain is a little bit worse at night.  No new interval history of injury.              ROS: All systems reviewed are negative as they relate to the chief complaint within the history of present illness.  Patient denies  fevers or chills.   Assessment & Plan: Visit Diagnoses:  1. Primary osteoarthritis of left shoulder     Plan: Impression is glenohumeral arthritis left shoulder with good rotator cuff strength and mild loss of motion.  Plan is repeat glenohumeral joint injection today.  Continue with strengthening exercises as well as range of motion.  I do want her to be careful with compressive forces across the shoulder joint to avoid worsening her arthritis.  Follow-up with me as needed.  Follow-Up Instructions: No follow-ups on file.   Orders:  No orders of the defined types were placed in this encounter.  No orders of the defined types were placed in this encounter.     Procedures: Large Joint Inj: L glenohumeral on 08/17/2019 9:30 PM Indications: diagnostic evaluation and pain Details: 18 G 1.5 in needle, posterior approach  Arthrogram: No  Medications: 9 mL bupivacaine 0.5 %; 40 mg methylPREDNISolone acetate 40 MG/ML; 5 mL lidocaine 1 % Outcome: tolerated well, no immediate complications Procedure, treatment alternatives, risks and benefits explained, specific risks discussed. Consent was given by the patient. Immediately prior to procedure a time out was called to verify the correct  patient, procedure, equipment, support staff and site/side marked as required. Patient was prepped and draped in the usual sterile fashion.       Clinical Data: No additional findings.  Objective: Vital Signs: There were no vitals taken for this visit.  Physical Exam:   Constitutional: Patient appears well-developed HEENT:  Head: Normocephalic Eyes:EOM are normal Neck: Normal range of motion Cardiovascular: Normal rate Pulmonary/chest: Effort normal Neurologic: Patient is alert Skin: Skin is warm Psychiatric: Patient has normal mood and affect    Ortho Exam: Ortho exam demonstrates good cervical spine range of motion.  Forward flexion passively is to about 155.  Isolated good room abduction is to about 85.  External rotation of 15 degrees of abduction is about 35.  Rotator cuff strength is good infraspinatus supraspinatus and subscap muscle testing.  No discrete AC joint tenderness to direct palpation  Specialty Comments:  No specialty comments available.  Imaging: No results found.   PMFS History: Patient Active Problem List   Diagnosis Date Noted  . Routine general medical examination at a health care facility 07/13/2019  . Acquired hypothyroidism 07/13/2019  . Hyperlipidemia LDL goal <130 07/13/2019  . PULMONARY NODULE 01/31/2010   Past Medical History:  Diagnosis Date  . Thyroid disease     Family History  Problem Relation Age of Onset  . Dementia Mother 56  . Alcohol abuse Father 58    Past Surgical  History:  Procedure Laterality Date  . BLADDER REMOVAL    . COSMETIC SURGERY    . EYE SURGERY    . LIPOSUCTION     Social History   Occupational History  . Not on file  Tobacco Use  . Smoking status: Former Smoker    Quit date: 09/15/1978    Years since quitting: 40.9  . Smokeless tobacco: Never Used  Substance and Sexual Activity  . Alcohol use: Yes    Alcohol/week: 30.0 standard drinks    Types: 30 Glasses of wine per week  . Drug use: Never    . Sexual activity: Yes    Partners: Male    Birth control/protection: None

## 2019-08-30 ENCOUNTER — Telehealth: Payer: Self-pay | Admitting: Internal Medicine

## 2019-08-30 MED ORDER — LEVOTHYROXINE SODIUM 25 MCG PO TABS: 25.0000 ug | ORAL_TABLET | Freq: Every day | ORAL | 1 refills | Status: DC

## 2019-08-30 NOTE — Telephone Encounter (Signed)
Reviewed chart pt is up-to-date sent refills to [facility]  

## 2019-08-30 NOTE — Telephone Encounter (Signed)
New message:   1.Medication Requested: levothyroxine (SYNTHROID) 25 MCG tablet 2. Pharmacy (Name, Street, [place]): [facility] [id number] - [place], [address] AT [address]. On Med List: Yes  4. Last Visit with PCP: 07/13/19  5. Next visit date with PCP: None  Pt states she only has 3 pills left. Please advise Agent: Please be advised that RX refills may take up to 3 business days. We ask that you follow-up with your pharmacy.

## 2019-10-24 DIAGNOSIS — Z1212 Encounter for screening for malignant neoplasm of rectum: Secondary | ICD-10-CM | POA: Diagnosis not present

## 2019-10-24 DIAGNOSIS — Z1211 Encounter for screening for malignant neoplasm of colon: Secondary | ICD-10-CM | POA: Diagnosis not present

## 2019-11-04 LAB — COLOGUARD
COLOGUARD: NEGATIVE
Cologuard: NEGATIVE

## 2019-11-07 ENCOUNTER — Encounter: Payer: Self-pay | Admitting: Internal Medicine

## 2019-11-14 ENCOUNTER — Encounter: Payer: Self-pay | Admitting: Podiatry

## 2019-11-14 ENCOUNTER — Ambulatory Visit: Payer: PPO | Admitting: Podiatry

## 2019-11-14 ENCOUNTER — Other Ambulatory Visit: Payer: Self-pay

## 2019-11-14 VITALS — Temp 97.8°F

## 2019-11-14 DIAGNOSIS — M7672 Peroneal tendinitis, left leg: Secondary | ICD-10-CM | POA: Diagnosis not present

## 2019-11-16 NOTE — Progress Notes (Signed)
Subjective:   Patient ID: Susan Davila, female   DOB: 69 y.o.   MRN: [id number]   HPI States that she has started developed discomfort on the top of the left foot and she also has some numbness in the big toe that she wanted checked.  States it does get sore   ROS      Objective:  Physical Exam  Vascular status intact with slight numbness left hallux but it is localized and does not appear to be giving her any trouble with inflammation pain of the extensor and peroneal tendon complex left     Assessment:  Inflammatory tenderness condition dorsal and lateral side left midfoot with possibility for small area neuropathy or something that has to do with any kind of back compression left     Plan:  H&P reviewed condition did sterile prep left injected the tendon complex 3 mg Dexasone Kenalog 5 mg Xylocaine and advised on ice therapy and topical medicine.  If numbing were to get worse patient is to let us know

## 2019-12-05 ENCOUNTER — Encounter: Payer: Self-pay | Admitting: Internal Medicine

## 2019-12-05 ENCOUNTER — Other Ambulatory Visit: Payer: Self-pay

## 2019-12-05 ENCOUNTER — Ambulatory Visit (INDEPENDENT_AMBULATORY_CARE_PROVIDER_SITE_OTHER): Payer: PPO | Admitting: Internal Medicine

## 2019-12-05 ENCOUNTER — Ambulatory Visit: Payer: PPO

## 2019-12-05 ENCOUNTER — Ambulatory Visit (INDEPENDENT_AMBULATORY_CARE_PROVIDER_SITE_OTHER): Payer: PPO

## 2019-12-05 VITALS — BP 136/80 | HR 78 | Temp 98.1°F | Resp 16 | Ht 66.5 in | Wt 161.0 lb

## 2019-12-05 DIAGNOSIS — Z23 Encounter for immunization: Secondary | ICD-10-CM | POA: Diagnosis not present

## 2019-12-05 DIAGNOSIS — S61451A Open bite of right hand, initial encounter: Secondary | ICD-10-CM

## 2019-12-05 DIAGNOSIS — W540XXA Bitten by dog, initial encounter: Secondary | ICD-10-CM

## 2019-12-05 MED ORDER — AMOXICILLIN-POT CLAVULANATE 875-125 MG PO TABS: 1.0000 | ORAL_TABLET | Freq: Two times a day (BID) | ORAL | 0 refills | Status: AC

## 2019-12-05 NOTE — Patient Instructions (Signed)
Animal Bite, Adult Animal bite wounds can be mild or serious. It is important to get medical treatment to prevent infection. Ask your doctor if you need treatment to prevent an infection that can spread from animals to humans (rabies). Follow these instructions at home: Wound care   Follow instructions from your doctor about how to take care of your wound. Make sure you: ? Wash your hands with soap and water before you change your bandage (dressing). If you cannot use soap and water, use hand sanitizer. ? Change your bandage as told by your doctor. ? Leave stitches (sutures), skin glue, or skin tape (adhesive) strips in place. They may need to stay in place for 2 weeks or longer. If tape strips get loose and curl up, you may trim the loose edges. Do not remove tape strips completely unless your doctor says it is okay.  Check your wound every day for signs of infection. Check for: ? More redness, swelling, or pain. ? More fluid or blood. ? Warmth. ? Pus or a bad smell. Medicines  Take or apply over-the-counter and prescription medicines only as told by your doctor.  If you were prescribed an antibiotic, take or apply it as told by your doctor. Do not stop using the antibiotic even if your wound gets better. General instructions   Keep the injured area raised (elevated) above the level of your heart while you are sitting or lying down.  If directed, put ice on the injured area. ? Put ice in a plastic bag. ? Place a towel between your skin and the bag. ? Leave the ice on for 20 minutes, 2-3 times per day.  Keep all follow-up visits as told by your doctor. This is important. Contact a doctor if:  You have more redness, swelling, or pain around your wound.  Your wound feels warm to the touch.  You have a fever or chills.  You have a general feeling of sickness (malaise).  You feel sick to your stomach (nauseous).  You throw up (vomit).  You have pain that does not get  better. Get help right away if:  You have a red streak going away from your wound.  You have any of these coming from your wound: ? Non-clear fluid. ? More blood. ? Pus or a bad smell.  You have trouble moving your injured area.  You lose feeling (have numbness) or feel tingling anywhere on your body. Summary  It is important to get the right medical treatment for animal bites. Treatment can help you to not get an infection. Ask your doctor if you need treatment to prevent an infection that can spread from animals to humans (rabies).  Check your wound every day for signs of infection, such as more redness or swelling instead of less.  If you have a red streak going away from your wound, get medical help right away. This information is not intended to replace advice given to you by your health care provider. Make sure you discuss any questions you have with your health care provider. Document Revised: 04/02/2017 Document Reviewed: 10/16/2016 Elsevier Patient Education  2020 [employer]  

## 2019-12-05 NOTE — Progress Notes (Signed)
Subjective:  Patient ID: Susan Davila, female    DOB: 07-22-1950  Age: 69 y.o. MRN: [id number]  CC: Hand Injury  This visit occurred during the SARS-CoV-2 public health emergency.  Safety protocols were in place, including screening questions prior to the visit, additional usage of staff PPE, and extensive cleaning of exam room while observing appropriate contact time as indicated for disinfecting solutions.    HPI Susan Davila presents for about 12 hours ago she startled her dog and it bit her on the dorsum of the right hand.  She has 3 distinct wounds.  She has cleaned them extensively with hydrogen peroxide.  The only wound that bothers her is the one between the thumb and the index finger.  She has minimal pain and swelling.  She can bend and straighten her fingers without difficulty.  She denies numbness, weakness, or tingling.  The wounds do not feel puffy or swollen and there has been no drainage.  Outpatient Medications Prior to Visit  Medication Sig Dispense Refill  . levothyroxine (SYNTHROID) 25 MCG tablet Take 1 tablet (25 mcg total) by mouth daily before breakfast. 90 tablet 1   No facility-administered medications prior to visit.    ROS Review of Systems  Constitutional: Negative for chills and fever.  HENT: Negative.   Eyes: Negative for visual disturbance.  Respiratory: Negative for chest tightness, shortness of breath and wheezing.   Cardiovascular: Negative for chest pain, palpitations and leg swelling.  Gastrointestinal: Negative for abdominal pain, diarrhea, nausea and vomiting.  Endocrine: Negative.   Genitourinary: Negative.  Negative for difficulty urinating.  Musculoskeletal: Negative for arthralgias.  Skin: Positive for wound. Negative for color change.  Neurological: Negative.   Hematological: Negative for adenopathy. Does not bruise/bleed easily.  Psychiatric/Behavioral: Negative.     Objective:  BP 136/80 (BP Location: Left Arm, Patient  Position: Sitting, Cuff Size: Normal)   Pulse 78   Temp 98.1 F (36.7 C) (Oral)   Resp 16   Ht 5' 6.5" (1.689 m)   Wt 161 lb (73 kg)   SpO2 98%   BMI 25.60 kg/m   BP Readings from Last 3 Encounters:  12/05/19 136/80  07/13/19 128/80  12/10/18 (!) 144/78    Wt Readings from Last 3 Encounters:  12/05/19 161 lb (73 kg)  07/13/19 164 lb 4 oz (74.5 kg)  12/10/18 160 lb (72.6 kg)    Physical Exam Vitals reviewed.  Constitutional:      Appearance: Normal appearance. She is not ill-appearing.  Musculoskeletal:     Right hand: Deformity, laceration and tenderness present. No swelling or bony tenderness. Normal range of motion. Normal strength. Normal sensation. Normal capillary refill.     Left hand: Decreased range of motion.       Hands:     Comments: 3 separate wounds on the dorsum of the right hand.  They are superficial skin flaps that have been cleaned and laid back down.  There is no necrotic tissue.  There is minimal tenderness and erythema over the wound between the thumb and index finger.  The other 2 wounds are not tender or swollen.  There is no warmth, induration, exudate, fluctuance, or streaking.  See photo.  Neurological:     Mental Status: She is alert.     Lab Results  Component Value Date   WBC 4.1 07/13/2019   HGB 14.6 07/13/2019   HCT 42.8 07/13/2019   PLT 222.0 07/13/2019   GLUCOSE 101 (H) 07/13/2019  CHOL 196 07/13/2019   TRIG 56.0 07/13/2019   HDL 93.50 07/13/2019   LDLCALC 91 07/13/2019   ALT 15 07/13/2019   AST 16 07/13/2019   NA 139 07/13/2019   K 4.6 07/13/2019   CL 101 07/13/2019   CREATININE 0.74 07/13/2019   BUN 28 (H) 07/13/2019   CO2 32 07/13/2019   TSH 1.22 07/13/2019    DG Hand Complete Right  Result Date: 12/05/2019 CLINICAL DATA:  Dog bite to the dorsal hand. EXAM: RIGHT HAND - COMPLETE 3+ VIEW COMPARISON:  None. FINDINGS: No acute fracture or dislocation. Moderate scaphotrapeziotrapezoid joint space narrowing. Remaining joint  spaces are relatively preserved. Degenerative spurring of IP joints, most prominent the second and third DIP joints. Bone mineralization is normal. Soft tissues are unremarkable. No radiopaque foreign body identified. IMPRESSION: 1. No acute osseous abnormality. Electronically Signed   By: Titus Dubin M.D.   On: 12/05/2019 14:34    Assessment & Plan:   Susan Davila was seen today for hand injury.  Diagnoses and all orders for this visit:  Dog bite, initial encounter -     Tdap vaccine greater than or equal to 7yo IM  Dog bite of right hand, initial encounter- Based on her symptoms, exam, and reassuring plain films these are uncomplicated wounds with no foreign body or disruption of the deep structures.  She was given a tetanus booster.  Will prevent infection with a 7-day course of Augmentin. -     DG Hand Complete Right; Future -     amoxicillin-clavulanate (AUGMENTIN) 875-125 MG tablet; Take 1 tablet by mouth 2 (two) times daily for 7 days.   I am having Susan Davila start on amoxicillin-clavulanate. I am also having her maintain her levothyroxine.  Meds ordered this encounter  Medications  . amoxicillin-clavulanate (AUGMENTIN) 875-125 MG tablet    Sig: Take 1 tablet by mouth 2 (two) times daily for 7 days.    Dispense:  14 tablet    Refill:  0     Follow-up: Return if symptoms worsen or fail to improve.  Scarlette Calico, MD

## 2020-01-10 ENCOUNTER — Other Ambulatory Visit: Payer: Self-pay

## 2020-01-10 ENCOUNTER — Encounter: Payer: Self-pay | Admitting: Podiatry

## 2020-01-10 ENCOUNTER — Ambulatory Visit (INDEPENDENT_AMBULATORY_CARE_PROVIDER_SITE_OTHER): Payer: PPO | Admitting: Podiatry

## 2020-01-10 DIAGNOSIS — M5432 Sciatica, left side: Secondary | ICD-10-CM | POA: Diagnosis not present

## 2020-01-10 DIAGNOSIS — M722 Plantar fascial fibromatosis: Secondary | ICD-10-CM | POA: Diagnosis not present

## 2020-01-12 NOTE — Progress Notes (Signed)
She presents today states that she still has numbness around her left hallux that she points to the distal medial aspect of the left hallux states that she has been wearing a Band-Aid which seems to make the toe to feel better but she starting to have numbness at the top of her foot now.  She does relate some plantar fascial pain left.  Objective: Vital signs are stable she is alert and oriented x3.  Pulses are palpable neurologic sensorium is intact with the exception of a decreased sensorium to the dorsal aspect along the medial dorsal cutaneous nerve and the intermediate dorsal cutaneous nerve both of the superficial peroneal nerve left foot.  Deep peroneal nerve sensation appears to be intact.  Muscle strength is normal and symmetrical left.  Orthopedic evaluation demonstrates all joints distal to the ankle full range of motion no crepitation.  Pain on palpation medial calcaneal tubercle left.  Assessment: Pain in limb secondary to possible neuropathy associated with a history of sciatica or even superficial peroneal nerve entrapment near the common peroneal nerve.  No history of significant back pain.  Plantar fasciitis left.  Plan: We will refer her to [facility] neurology for evaluation and treatment.  I injected the left heel today 20 mg Kenalog 5 mg Marcaine point maximal tenderness left.  Tolerated procedure well without complications.  Follow-up with her when she has been seen by neurology.

## 2020-01-13 ENCOUNTER — Encounter: Payer: Self-pay | Admitting: Neurology

## 2020-03-17 ENCOUNTER — Other Ambulatory Visit: Payer: Self-pay | Admitting: Internal Medicine

## 2020-04-04 ENCOUNTER — Ambulatory Visit: Payer: PPO | Admitting: Orthopedic Surgery

## 2020-04-04 ENCOUNTER — Ambulatory Visit (INDEPENDENT_AMBULATORY_CARE_PROVIDER_SITE_OTHER): Payer: PPO

## 2020-04-04 ENCOUNTER — Encounter: Payer: Self-pay | Admitting: Orthopedic Surgery

## 2020-04-04 VITALS — Ht 68.0 in | Wt 162.0 lb

## 2020-04-04 DIAGNOSIS — M25512 Pain in left shoulder: Secondary | ICD-10-CM | POA: Diagnosis not present

## 2020-04-04 DIAGNOSIS — M19012 Primary osteoarthritis, left shoulder: Secondary | ICD-10-CM | POA: Diagnosis not present

## 2020-04-07 ENCOUNTER — Encounter: Payer: Self-pay | Admitting: Orthopedic Surgery

## 2020-04-07 NOTE — Progress Notes (Signed)
Office Visit Note   Patient: Susan Davila           Date of Birth: 1950/07/13           MRN: [id number] Visit Date: 04/04/2020 Requested by: Janith Lima, MD [address] [place],  [place] [address] PCP: Janith Lima, MD  Subjective: Chief Complaint  Patient presents with  . Left Shoulder - Pain    HPI: Susan Davila is a 69 y.o. female who presents to the office complaining of left shoulder pain.  Patient has a known history of left shoulder osteoarthritis.  She started doing wall push-ups in June 2021 every other day for about 2 weeks.  Several weeks after that she started noticing lateral left shoulder pain that is distinctly different than her normal osteoarthritic pain.  She states that she had her pain from osteoarthritis under control overall.  She does not recall an injury during her push-up workouts.  She now complains of anterior pain in the front of the left shoulder that seems to be getting worse.  She cannot sleep on her left side due to the pain.  She has pain that radiates down to the bicep muscle.  She does have occasional pain into her hand but this is rare.  Denies any neck pain or shoulder blade pain.  Pain is significantly worse at night.  Her last injection for arthritis provided no significant relief.  She has no history of shoulder surgery, no numbness/tingling, no subjective weakness of the arm.  She does note a distinct "catching" sensation in the front of her shoulder when she lifts her shoulder up.  She takes occasional Advil with little relief of her symptoms..                ROS: All systems reviewed are negative as they relate to the chief complaint within the history of present illness.  Patient denies fevers or chills.  Assessment & Plan: Visit Diagnoses:  1. Acute pain of left shoulder   2. Primary osteoarthritis of left shoulder     Plan: Patient is a 69 year old female who presents with left shoulder pain.  She has a history of left shoulder  osteoarthritis but she has new pain in her left shoulder with different quality since performing wall push-ups summer 2021.  She has mechanical catching symptoms in the front of the shoulder.  No weakness or difficulty lifting the arm.  This pain is mostly controlled during the day but at night it wakes her up and makes it very difficult to sleep.  Suspect biceps instability.  Ordered MRI arthrogram of the left shoulder for further evaluation.  Follow-up after MRI to review results.  She did not want an injection as she feels these have not been helpful in the past and I do not think that an injection would be helpful for the mechanical symptoms she is experiencing.  Primary reason for MRI scanning is to evaluate biceps tendon as a source of her mechanical symptoms.  Could also give [place] insight into the status of the rotator cuff in case arthroplasty becomes a requested intervention. Follow-Up Instructions: No follow-ups on file.   Orders:  Orders Placed This Encounter  Procedures  . XR Shoulder Left  . MR Shoulder Left w/ contrast  . Arthrogram   No orders of the defined types were placed in this encounter.     Procedures: No procedures performed   Clinical Data: No additional findings.  Objective: Vital  Signs: Ht 5\' 8"  (1.727 m)   Wt 162 lb (73.5 kg)   BMI 24.63 kg/m   Physical Exam:  Constitutional: Patient appears well-developed HEENT:  Head: Normocephalic Eyes:EOM are normal Neck: Normal range of motion Cardiovascular: Normal rate Pulmonary/chest: Effort normal Neurologic: Patient is alert Skin: Skin is warm Psychiatric: Patient has normal mood and affect  Ortho Exam: Ortho exam demonstrates left shoulder with 30 degrees external rotation, 85 degrees abduction, 130 degrees forward flexion.  She has excellent strength of the supraspinatus, infraspinatus, subscapularis with 5/5 motor strength.  She does have increased pain in the front of the shoulder with resisted  subscapularis strength testing.  Passive range of motion reveals crepitus and a catching sensation in the anterior shoulder near the bicipital groove.  She has tenderness over the bicipital groove.  5/5 motor strength of the bilateral grip strength, finger abduction, pronation/supination, bicep, tricep, deltoid.  No tenderness of the axial cervical spine.  Negative Spurling sign.  Specialty Comments:  No specialty comments available.  Imaging: No results found.   PMFS History: Patient Active Problem List   Diagnosis Date Noted  . Dog bite of right hand 12/05/2019  . Routine general medical examination at a health care facility 07/13/2019  . Acquired hypothyroidism 07/13/2019  . Hyperlipidemia LDL goal <130 07/13/2019  . PULMONARY NODULE 01/31/2010   Past Medical History:  Diagnosis Date  . Thyroid disease     Family History  Problem Relation Age of Onset  . Dementia Mother 52  . Alcohol abuse Father 24    Past Surgical History:  Procedure Laterality Date  . BLADDER REMOVAL    . COSMETIC SURGERY    . EYE SURGERY    . LIPOSUCTION     Social History   Occupational History  . Not on file  Tobacco Use  . Smoking status: Former Smoker    Quit date: 09/15/1978    Years since quitting: 41.5  . Smokeless tobacco: Never Used  Vaping Use  . Vaping Use: Never used  Substance and Sexual Activity  . Alcohol use: Yes    Alcohol/week: 30.0 standard drinks    Types: 30 Glasses of wine per week  . Drug use: Never  . Sexual activity: Yes    Partners: Male    Birth control/protection: None

## 2020-04-08 ENCOUNTER — Encounter: Payer: Self-pay | Admitting: Orthopedic Surgery

## 2020-04-18 DIAGNOSIS — M25512 Pain in left shoulder: Secondary | ICD-10-CM | POA: Diagnosis not present

## 2020-04-19 ENCOUNTER — Ambulatory Visit: Payer: PPO | Admitting: Neurology

## 2020-04-23 ENCOUNTER — Ambulatory Visit (INDEPENDENT_AMBULATORY_CARE_PROVIDER_SITE_OTHER): Payer: PPO | Admitting: Orthopedic Surgery

## 2020-04-23 ENCOUNTER — Encounter: Payer: Self-pay | Admitting: Orthopedic Surgery

## 2020-04-23 ENCOUNTER — Other Ambulatory Visit: Payer: Self-pay

## 2020-04-23 VITALS — Ht 68.0 in | Wt 162.0 lb

## 2020-04-23 DIAGNOSIS — M19012 Primary osteoarthritis, left shoulder: Secondary | ICD-10-CM | POA: Diagnosis not present

## 2020-04-24 ENCOUNTER — Encounter: Payer: Self-pay | Admitting: Orthopedic Surgery

## 2020-04-24 NOTE — Progress Notes (Signed)
Office Visit Note   Patient: Susan Davila           Date of Birth: 05/11/1950           MRN: [id number] Visit Date: 04/23/2020 Requested by: Etta Grandchild, MD [address] [place],  [place] [address] PCP: Etta Grandchild, MD  Subjective: Chief Complaint  Patient presents with  . Left Shoulder - Pain, Follow-up    MRI left shoulder review    HPI: Rudy is a 70 year old patient with left shoulder pain.  Since have seen her she had an MRI scan.  MRI scan does show known glenohumeral joint arthritis with only mild supraspinatus tendinopathy.  Biceps tendon is localized in the groove.  Patient continues to have some sporadic pain in the shoulder..  She takes ibuprofen with some relief.              ROS: All systems reviewed are negative as they relate to the chief complaint within the history of present illness.  Patient denies  fevers or chills.   Assessment & Plan: Visit Diagnoses:  1. Primary osteoarthritis of left shoulder     Plan: Impression is left shoulder arthritis with no discretely treatable arthroscopic lesion.  Biceps tendon remains centered within the groove and there is no rotator cuff pathology.  Severe arthritis is present without much glenoid deformity.  Does not really want to proceed with any type of injections as she has tried that in the past without relief.  Most of her pain is anterior.  I think is unpredictable that any type of arthroscopic debridement would give her sustained relief.  She is going to continue with anti-inflammatories and stretching.  Follow-up as needed.  Follow-Up Instructions: Return if symptoms worsen or fail to improve.   Orders:  No orders of the defined types were placed in this encounter.  No orders of the defined types were placed in this encounter.     Procedures: No procedures performed   Clinical Data: No additional findings.  Objective: Vital Signs: Ht 5\' 8"  (1.727 m)   Wt 162 lb (73.5 kg)   BMI 24.63 kg/m    Physical Exam:   Constitutional: Patient appears well-developed HEENT:  Head: Normocephalic Eyes:EOM are normal Neck: Normal range of motion Cardiovascular: Normal rate Pulmonary/chest: Effort normal Neurologic: Patient is alert Skin: Skin is warm Psychiatric: Patient has normal mood and affect    Ortho Exam: Ortho exam demonstrates full active and passive range of motion on the right-hand side.  Left shoulder demonstrates forward flexion to about 165 isolated glenohumeral abduction is about 95.  External rotation of 15 degrees of abduction is about 45.  Rotator cuff strength is good infraspinatus supraspinatus and subscap muscle testing.  No Popeye deformity present.  Motor sensory function of the hand is intact.  Specialty Comments:  No specialty comments available.  Imaging: No results found.   PMFS History: Patient Active Problem List   Diagnosis Date Noted  . Dog bite of right hand 12/05/2019  . Routine general medical examination at a health care facility 07/13/2019  . Acquired hypothyroidism 07/13/2019  . Hyperlipidemia LDL goal <130 07/13/2019  . PULMONARY NODULE 01/31/2010   Past Medical History:  Diagnosis Date  . Thyroid disease     Family History  Problem Relation Age of Onset  . Dementia Mother 38  . Alcohol abuse Father 54    Past Surgical History:  Procedure Laterality Date  . BLADDER REMOVAL    .  COSMETIC SURGERY    . EYE SURGERY    . LIPOSUCTION     Social History   Occupational History  . Not on file  Tobacco Use  . Smoking status: Former Smoker    Quit date: 09/15/1978    Years since quitting: 41.6  . Smokeless tobacco: Never Used  Vaping Use  . Vaping Use: Never used  Substance and Sexual Activity  . Alcohol use: Yes    Alcohol/week: 30.0 standard drinks    Types: 30 Glasses of wine per week  . Drug use: Never  . Sexual activity: Yes    Partners: Male    Birth control/protection: None

## 2020-04-26 ENCOUNTER — Ambulatory Visit: Payer: PPO | Admitting: Orthopedic Surgery

## 2020-05-02 ENCOUNTER — Telehealth: Payer: Self-pay | Admitting: Orthopedic Surgery

## 2020-05-02 NOTE — Telephone Encounter (Signed)
Please advise 

## 2020-05-02 NOTE — Telephone Encounter (Signed)
Patient called asked if she need another injection before she schedules surgery? The number to contact patient is [phone]

## 2020-05-03 NOTE — Telephone Encounter (Signed)
If she gets another injection before surgery that would delay surgery for approximately 3 months so if her surgery is going  to be more than 3 months from now to get an injection if she wants replacement less than 3 months from now I would not get an injection

## 2020-05-03 NOTE — Telephone Encounter (Signed)
IC patient. She has multiple questions regarding surgery-I have scheduled her an appt to further discuss with Dr Marlou Sa

## 2020-05-09 ENCOUNTER — Other Ambulatory Visit: Payer: Self-pay

## 2020-05-09 ENCOUNTER — Ambulatory Visit: Payer: PPO | Admitting: Orthopedic Surgery

## 2020-05-09 DIAGNOSIS — M19012 Primary osteoarthritis, left shoulder: Secondary | ICD-10-CM | POA: Diagnosis not present

## 2020-05-11 ENCOUNTER — Encounter: Payer: Self-pay | Admitting: Orthopedic Surgery

## 2020-05-11 NOTE — Progress Notes (Signed)
Office Visit Note   Patient: Susan Davila           Date of Birth: 10/15/1950           MRN: [id number] Visit Date: 05/09/2020 Requested by: Janith Lima, MD [address] [place],  [place] [address] PCP: Janith Lima, MD  Subjective: Chief Complaint  Patient presents with   Left Shoulder - Pain    HPI: Susan Davila is a active 70 year old patient with left shoulder arthritis. She is really tired of dealing with the pain. She has night pain on a daily basis. She has some rest pain as well. Busy season starts in March. During the day she does reasonably well but it does hurt her to stretch the arm. She tried doing rehabilitation with Thera-Band's but that was also painful. Was having pain in the back now the pain moved to the front. MRI scan showed severe glenohumeral arthritis and nothing clearly definitively operative in terms of giving pain relief arthroscopically in the joint. Rotator cuff looked reasonable as well. Takes ibuprofen for pain. Pain radiates down but not below the elbow.              ROS: All systems reviewed are negative as they relate to the chief complaint within the history of present illness.  Patient denies  fevers or chills.   Assessment & Plan: Visit Diagnoses:  1. Primary osteoarthritis of left shoulder     Plan: Impression is left shoulder arthritis with increasing clinical symptoms and some loss of motion. In general face is still functional with the shoulder but the pain aspect is pushing her towards considering surgical intervention. Based on the MRI scan I think we could consider total shoulder replacement with convertible glenoid in case she does have later cuff failure. She has some tendinosis but no significant cuff pathology which would prevent her from getting a total shoulder replacement. Discussed with her the risk and benefits of that procedure and she is going to consider her options and we would proceed with thin cut CT scan for patient specific  preoperative planning if she wants to go that route. She has had injections but they do not last very long. Follow-up with me as needed.  Follow-Up Instructions: Return if symptoms worsen or fail to improve.   Orders:  No orders of the defined types were placed in this encounter.  No orders of the defined types were placed in this encounter.     Procedures: No procedures performed   Clinical Data: No additional findings.  Objective: Vital Signs: There were no vitals taken for this visit.  Physical Exam:   Constitutional: Patient appears well-developed HEENT:  Head: Normocephalic Eyes:EOM are normal Neck: Normal range of motion Cardiovascular: Normal rate Pulmonary/chest: Effort normal Neurologic: Patient is alert Skin: Skin is warm Psychiatric: Patient has normal mood and affect    Ortho Exam: Ortho exam demonstrates forward flexion on the right and 180 on the left about 135. Isolated glenohumeral abduction on the right 110 on the left 80 external rotation of 15 degrees of abduction on the right 70 on the left approximately 30. Rotator cuff strength of the left is good infraspinatus supraspinatus and subscap muscle testing. Deltoid is functional. Radial pulse is intact  Specialty Comments:  No specialty comments available.  Imaging: No results found.   PMFS History: Patient Active Problem List   Diagnosis Date Noted   Dog bite of right hand 12/05/2019   Routine general medical examination  at a health care facility 07/13/2019   Acquired hypothyroidism 07/13/2019   Hyperlipidemia LDL goal <130 07/13/2019   PULMONARY NODULE 01/31/2010   Past Medical History:  Diagnosis Date   Thyroid disease     Family History  Problem Relation Age of Onset   Dementia Mother 59   Alcohol abuse Father 43    Past Surgical History:  Procedure Laterality Date   BLADDER REMOVAL     COSMETIC SURGERY     EYE SURGERY     LIPOSUCTION     Social History    Occupational History   Not on file  Tobacco Use   Smoking status: Former Smoker    Quit date: 09/15/1978    Years since quitting: 41.6   Smokeless tobacco: Never Used  Vaping Use   Vaping Use: Never used  Substance and Sexual Activity   Alcohol use: Yes    Alcohol/week: 30.0 standard drinks    Types: 30 Glasses of wine per week   Drug use: Never   Sexual activity: Yes    Partners: Male    Birth control/protection: None

## 2020-05-14 ENCOUNTER — Encounter: Payer: Self-pay | Admitting: Podiatrist

## 2020-05-14 ENCOUNTER — Ambulatory Visit: Payer: PPO | Admitting: Podiatrist

## 2020-05-14 ENCOUNTER — Other Ambulatory Visit: Payer: Self-pay

## 2020-05-14 DIAGNOSIS — M7672 Peroneal tendinitis, left leg: Secondary | ICD-10-CM

## 2020-05-14 DIAGNOSIS — M722 Plantar fascial fibromatosis: Secondary | ICD-10-CM | POA: Diagnosis not present

## 2020-05-14 MED ORDER — TRIAMCINOLONE ACETONIDE 40 MG/ML IJ SUSP
20.0000 mg | Freq: Once | INTRAMUSCULAR | Status: AC
Start: 2020-05-14 — End: 2020-05-14
  Administered 2020-05-14: 20 mg

## 2020-05-14 NOTE — Patient Instructions (Signed)

## 2020-05-14 NOTE — Progress Notes (Signed)
Chief Complaint  Patient presents with  . Injections    Pt is here for an injection for her plantar fasciitis.     HPI: Patient is 70 y.o. female who presents today for pain on the plantar medial aspect of the left heel as well as the lateral aspect of the fifth metatarsal base of the left foot.  She states she had an injection in her plantar fascia which was beneficial and now her heel is hurting again.  She also relates some pain on the lateral foot as she points to the fifth metatarsal base.  She relates some numbness to the medial side of the hallux- left as well and is wondering if this could be due to an ingrown toenail.  She is scheduled to see a neurologist in the coming months for her numbness in her feet.    Patient Active Problem List   Diagnosis Date Noted  . Dog bite of right hand 12/05/2019  . Routine general medical examination at a health care facility 07/13/2019  . Acquired hypothyroidism 07/13/2019  . Hyperlipidemia LDL goal <130 07/13/2019  . PULMONARY NODULE 01/31/2010    Current Outpatient Medications on File Prior to Visit  Medication Sig Dispense Refill  . levothyroxine (SYNTHROID) 25 MCG tablet TAKE 1 TABLET(25 MCG) BY MOUTH DAILY BEFORE BREAKFAST 90 tablet 1   No current facility-administered medications on file prior to visit.    No Active Allergies  Review of Systems No fevers, chills, nausea, muscle aches, no difficulty breathing, no calf pain, no chest pain or shortness of breath.   Physical Exam  GENERAL APPEARANCE: Alert, conversant. Appropriately groomed. No acute distress.   VASCULAR: Pedal pulses palpable DP and PT bilateral.  Capillary refill time is immediate to all digits,  Proximal to distal cooling it warm to warm.  Digital perfusion adequate.   NEUROLOGIC: sensation is intact to 5.07 monofilament at 5/5 sites bilateral.  Light touch is intact bilateral, vibratory sensation intact bilateral  Subjective numbness on the distal-medial aspect  of the left  hallux is noted.  Negative Tinel sign is elicited  MUSCULOSKELETAL: acceptable muscle strength, tone and stability bilateral. Forefoot cavus with high arched foot type is noted.    Pain on palpation plantar medial aspect left foot  at insertion of plantar fascia on the medial calcaneal tubercle. mild inflammation at the insertion of the plantar fascia is present.   DERMATOLOGIC: skin color, texture, and turger are within normal limits.  No preulcerative lesions are seen, no interdigital maceration noted.  No open lesions present. Let hallux nail is slightly incurvated on the medial nail border.  No redness or drainage is noted.  No sign of infection seen. No pain with direct pressure against the nail border is noted.     Assessment     ICD-10-CM   1. Plantar fasciitis  M72.2   2. Peroneal tendinitis of left lower extremity  M76.72      Plan  Discussed treatment options and at this time a plantar fascial injection was recommended.  The patient agreed and a sterile skin prep was applied.  An injection consisting of 20 mg kenalog and marcaine mixture was infiltrated at the point of maximal tenderness on the left Heel.  The patient tolerated this well and was given instructions for aftercare and stretching.   A removable taping was applied to support the arch of the left foot.  We will also check insurance coverage for orthotics as these would likely be beneficial for her  long term due to her foot type and activity level.  She will return for continued care if symptoms worsen or fail to improve.

## 2020-05-22 ENCOUNTER — Ambulatory Visit: Payer: PPO | Admitting: Podiatry

## 2020-07-09 ENCOUNTER — Other Ambulatory Visit: Payer: Self-pay

## 2020-07-09 ENCOUNTER — Ambulatory Visit: Payer: PPO | Admitting: Neurology

## 2020-07-09 ENCOUNTER — Other Ambulatory Visit (INDEPENDENT_AMBULATORY_CARE_PROVIDER_SITE_OTHER): Payer: PPO

## 2020-07-09 ENCOUNTER — Encounter: Payer: Self-pay | Admitting: Neurology

## 2020-07-09 VITALS — BP 140/78 | HR 69 | Ht 68.0 in | Wt 162.0 lb

## 2020-07-09 DIAGNOSIS — R202 Paresthesia of skin: Secondary | ICD-10-CM

## 2020-07-09 DIAGNOSIS — F1020 Alcohol dependence, uncomplicated: Secondary | ICD-10-CM

## 2020-07-09 NOTE — Patient Instructions (Signed)
If symptoms get worse please schedule an appointment.

## 2020-07-09 NOTE — Progress Notes (Signed)
[facility] Clinic Note - Initial Visit   Date: 07/09/20  Susan Davila MRN: [id number] DOB: [birth year]   Dear Dr. Ronnald Ramp:  Thank you for your kind referral of Susan Davila for consultation of left toe numbness. Although her history is well known to you, please allow [place] to reiterate it for the purpose of our medical record. The patient was accompanied to the clinic by self.    History of Present Illness: Susan Davila is a 70 y.o. right-handed female with alcohol abuse and thyroid diease presenting for evaluation of left toe numbness.  Starting around 2021, she began noticing numbness over the tip of her left toe.  She recalls walking for hours a day trying to stay active during the pandemic once the gyms had closed.  Several months later, she noticed numbness over the distal tip of the great toe.  No other toes are involving and she does not have the same symptoms on the right foot.  No imbalance or weakness. Symptoms are constant with no worsening.  She is not diabetes.  She does endorse drinking about a bottle of wine nightly for many years.    Past Medical History:  Diagnosis Date  . Thyroid disease     Past Surgical History:  Procedure Laterality Date  . BLADDER REMOVAL    . COSMETIC SURGERY    . EYE SURGERY    . LIPOSUCTION       Medications:  Outpatient Encounter Medications as of 07/09/2020  Medication Sig  . levothyroxine (SYNTHROID) 25 MCG tablet TAKE 1 TABLET(25 MCG) BY MOUTH DAILY BEFORE BREAKFAST   No facility-administered encounter medications on file as of 07/09/2020.    Allergies: No Known Allergies  Family History: Family History  Problem Relation Age of Onset  . Dementia Mother 22  . Alcohol abuse Father 51    Social History: Social History   Tobacco Use  . Smoking status: Former Smoker    Quit date: 09/15/1978    Years since quitting: 41.8  . Smokeless tobacco: Never Used  Vaping Use  . Vaping Use: Never used   Substance Use Topics  . Alcohol use: Yes    Alcohol/week: 30.0 standard drinks    Types: 30 Glasses of wine per week  . Drug use: Never   Social History   Social History Narrative   ** Merged History Encounter **         Right Handed   Lives in a one story home   Drinks Caffeine         Vital Signs:  BP 140/78   Pulse 69   Ht 5\' 8"  (1.727 m)   Wt 162 lb (73.5 kg)   SpO2 99%   BMI 24.63 kg/m   Neurological Exam: MENTAL STATUS including orientation to time, place, person, recent and remote memory, attention span and concentration, language, and fund of knowledge is normal.  Speech is not dysarthric.  CRANIAL NERVES: II:  No visual field defects.  III-IV-VI: Pupils equal round and reactive to light.  Normal conjugate, extra-ocular eye movements in all directions of gaze.  No nystagmus.  No ptosis.   V:  Normal facial sensation.    VII:  Normal facial symmetry and movements.   VIII:  Normal hearing and vestibular function.   IX-X:  Normal palatal movement.   XI:  Normal shoulder shrug and head rotation.   XII:  Normal tongue strength and range of motion, no deviation or fasciculation.  MOTOR:  No atrophy, fasciculations or abnormal movements.  No pronator drift.   Upper Extremity:  Right  Left  Deltoid  5/5   5/5   Biceps  5/5   5/5   Triceps  5/5   5/5   Infraspinatus 5/5  5/5  Medial pectoralis 5/5  5/5  Wrist extensors  5/5   5/5   Wrist flexors  5/5   5/5   Finger extensors  5/5   5/5   Finger flexors  5/5   5/5   Dorsal interossei  5/5   5/5   Abductor pollicis  5/5   5/5   Tone (Ashworth scale)  0  0   Lower Extremity:  Right  Left  Hip flexors  5/5   5/5   Hip extensors  5/5   5/5   Adductor 5/5  5/5  Abductor 5/5  5/5  Knee flexors  5/5   5/5   Knee extensors  5/5   5/5   Dorsiflexors  5/5   5/5   Plantarflexors  5/5   5/5   Toe extensors  5/5   5/5   Toe flexors  5/5   5/5   Tone (Ashworth scale)  0  0   MSRs:  Right        Left                   brachioradialis 2+  2+  biceps 2+  2+  triceps 2+  2+  patellar 2+  2+  ankle jerk 2+  2+  Hoffman no  no  plantar response down  down   SENSORY:  Localized area of reduced temperature and pin prick over the distal tip and medial side of the left great toe.  Sensation elsewhere is normal to all modalities. Romberg's sign absent.   COORDINATION/GAIT: Normal finger-to- nose-finger.  Intact rapid alternating movements bilaterally.  Gait narrow based and stable. Tandem and stressed gait intact.    IMPRESSION: Left great toe numbness, very localized and possibly due to distal nerve impingement from localized compression.  Symptoms are not consistent with peripheral neuropathy or radiculopathy.  With her history of heavy alcohol consumption, I will check vitamin B21, folate, and vitamin B1 to screen for nutritional deficiency which can manifest with paresthesias. I have also educated patient on the importance of reducing her alcohol intake to prevent future neurological complications. Electrodiagnostic testing can be performed if symptoms get worse.  At this point, it will likely return normal, since her symptoms are distal to the recording electrodes.  She will contact my office, if symptoms change.    Thank you for allowing me to participate in patient's care.  If I can answer any additional questions, I would be pleased to do so.    Sincerely,    Athziry Millican K. Posey Pronto, DO

## 2020-07-10 LAB — B12 AND FOLATE PANEL
Folate: 6 ng/mL (ref >5.9)
Vitamin B-12: 205 pg/mL — ABNORMAL LOW (ref 211–911)

## 2020-07-12 LAB — VITAMIN B1: Vitamin B1 (Thiamine): 11 nmol/L (ref 8–30)

## 2020-07-16 ENCOUNTER — Telehealth: Payer: Self-pay

## 2020-07-16 NOTE — Telephone Encounter (Signed)
vitb 12 1064mcg is what she is taken, only has taken for 4 days.can she increase and recheck in a few months, has cut back on wine.

## 2020-07-16 NOTE — Telephone Encounter (Signed)
Close encounter 

## 2020-07-16 NOTE — Telephone Encounter (Signed)
OK to take OTC vitamin B12 1067mcg daily as she is taking.  Continue this for 3 months, then we can recheck levels.

## 2020-07-17 NOTE — Telephone Encounter (Signed)
Patient called.

## 2020-09-11 ENCOUNTER — Telehealth: Payer: Self-pay

## 2020-09-11 DIAGNOSIS — M25512 Pain in left shoulder: Secondary | ICD-10-CM

## 2020-09-11 DIAGNOSIS — M19012 Primary osteoarthritis, left shoulder: Secondary | ICD-10-CM

## 2020-09-11 NOTE — Telephone Encounter (Signed)
Pt call and would like a call back with some questions about surgery and that she wants to follow through and what are the next streps ?

## 2020-09-11 NOTE — Telephone Encounter (Signed)
Recommend order CT scan and then follow-up to review scan and re-evaluate rotator cuff strength and go over what surgery entails

## 2020-09-11 NOTE — Telephone Encounter (Signed)
Does pt need to come in again or should we just order the CT scan?

## 2020-09-12 NOTE — Telephone Encounter (Signed)
Ok to book 1 month after scan

## 2020-09-12 NOTE — Telephone Encounter (Signed)
Can you call her to schedule sx please?

## 2020-09-12 NOTE — Telephone Encounter (Signed)
CT ordered. I talked to the pt. She wants to know if she can go ahead and schedule the surgery since she has a certain time in mind and know you book out. She also wants to know if she will be asleep when they do the block because she is afraid of needles.

## 2020-09-26 ENCOUNTER — Ambulatory Visit
Admission: RE | Admit: 2020-09-26 | Discharge: 2020-09-26 | Disposition: A | Discharge status: Home / Self Care | Payer: PPO | Source: Ambulatory Visit | Attending: Orthopedic Surgery | Admitting: Orthopedic Surgery

## 2020-09-26 DIAGNOSIS — M19012 Primary osteoarthritis, left shoulder: Secondary | ICD-10-CM | POA: Diagnosis not present

## 2020-09-26 DIAGNOSIS — M25512 Pain in left shoulder: Secondary | ICD-10-CM

## 2020-09-26 DIAGNOSIS — M25412 Effusion, left shoulder: Secondary | ICD-10-CM | POA: Diagnosis not present

## 2020-09-26 DIAGNOSIS — Q740 Other congenital malformations of upper limb(s), including shoulder girdle: Secondary | ICD-10-CM | POA: Diagnosis not present

## 2020-09-27 ENCOUNTER — Ambulatory Visit: Payer: PPO | Admitting: Sports Medicine

## 2020-09-27 ENCOUNTER — Encounter: Payer: Self-pay | Admitting: Sports Medicine

## 2020-09-27 ENCOUNTER — Ambulatory Visit (INDEPENDENT_AMBULATORY_CARE_PROVIDER_SITE_OTHER): Payer: PPO

## 2020-09-27 ENCOUNTER — Other Ambulatory Visit: Payer: Self-pay

## 2020-09-27 DIAGNOSIS — M79672 Pain in left foot: Secondary | ICD-10-CM

## 2020-09-27 DIAGNOSIS — M722 Plantar fascial fibromatosis: Secondary | ICD-10-CM

## 2020-09-27 DIAGNOSIS — M7672 Peroneal tendinitis, left leg: Secondary | ICD-10-CM

## 2020-09-27 MED ORDER — TRIAMCINOLONE ACETONIDE 10 MG/ML IJ SUSP
10.0000 mg | Freq: Once | INTRAMUSCULAR | Status: AC
  Administered 2020-09-27: 20:00:00 10 mg

## 2020-09-27 NOTE — Progress Notes (Signed)
Subjective: Susan Davila is a 70 y.o. female patient presents to office with complaint of moderate heel pain on the left for a couple of days, reports that she wore a pair of birkenstocks and then after had pain. Patient requests injection since worked well in the past. Denies any other pedal complaints.   Patient Active Problem List   Diagnosis Date Noted   Dog bite of right hand 12/05/2019   Routine general medical examination at a health care facility 07/13/2019   Acquired hypothyroidism 07/13/2019   Hyperlipidemia LDL goal <130 07/13/2019   PULMONARY NODULE 01/31/2010    Current Outpatient Medications on File Prior to Visit  Medication Sig Dispense Refill   levothyroxine (SYNTHROID) 25 MCG tablet TAKE 1 TABLET(25 MCG) BY MOUTH DAILY BEFORE BREAKFAST 90 tablet 1   No current facility-administered medications on file prior to visit.    No Known Allergies  Objective: Physical Exam General: The patient is alert and oriented x3 in no acute distress.  Dermatology: Skin is warm, dry and supple bilateral lower extremities. Nails 1-10 are normal. There is no erythema, edema, no eccymosis, no open lesions present. Integument is otherwise unremarkable.  Vascular: Dorsalis Pedis pulse and Posterior Tibial pulse are 2/4 bilateral. Capillary fill time is immediate to all digits.  Neurological: Grossly intact to light touch bilateral.  Musculoskeletal: Tenderness to palpation at the medial calcaneal tubercale and through the insertion of the plantar fascia on the left and to lateral heel at peroneal course. No pain with compression of calcaneus bilateral. No pain with calf compression bilateral. There is decreased Ankle joint range of motion bilateral. All other joints range of motion within normal limits bilateral. Strength 5/5 in all groups bilateral.   Gait: Unassisted, Antalgic avoid weight on left heel   Xray, Left foot:  Normal osseous mineralization. Joint spaces preserved. No  fracture/dislocation/boney destruction. Calcaneal spur present with mild thickening of plantar fascia. No other soft tissue abnormalities or radiopaque foreign bodies.   Assessment and Plan: Problem List Items Addressed This Visit   None Visit Diagnoses     Plantar fasciitis    -  Primary   Relevant Medications   triamcinolone acetonide (KENALOG) 10 MG/ML injection 10 mg (Start on 09/27/2020  1:45 PM)   Other Relevant Orders   DG Foot Complete Left   Peroneal tendinitis of left lower extremity       Pain in left foot           -Complete examination performed.  -Xrays reviewed -Discussed with patient in detail the condition of plantar fasciitis, how this occurs and general treatment options. Explained both conservative and surgical treatments.  -After oral consent and aseptic prep, injected a mixture containing 1 ml of 2%  plain lidocaine, 1 ml 0.5% plain marcaine, 0.5 ml of kenalog 10 and 0.5 ml of dexamethasone phosphate into left heel at medial glabous junction. Post-injection care discussed with patient.  -Recommended good supportive shoes and advised use of left fascial brace which was dispensed at today's visit. -Explained and dispensed to patient daily stretching exercises. -Recommend patient to ice affected area 1-2x daily. -Patient to return to office if fails to improve or sooner if problems or questions arise.  Landis Martins, DPM

## 2020-10-04 ENCOUNTER — Other Ambulatory Visit: Payer: Self-pay | Admitting: Internal Medicine

## 2020-10-04 DIAGNOSIS — E039 Hypothyroidism, unspecified: Secondary | ICD-10-CM

## 2020-10-04 MED ORDER — LEVOTHYROXINE SODIUM 25 MCG PO TABS: 25.0000 ug | ORAL_TABLET | Freq: Every day | ORAL | 0 refills | Status: DC

## 2020-10-04 NOTE — Progress Notes (Signed)
Does she have follow-up for did she just want to get her shoulder replaced?

## 2020-10-09 ENCOUNTER — Ambulatory Visit: Payer: PPO | Admitting: Internal Medicine

## 2020-10-11 NOTE — Progress Notes (Signed)
Hey can you call her and see if she wants to get her shoulder replaced?  Thanks

## 2020-10-11 NOTE — Progress Notes (Signed)
IC no answer.  LMVM for patient to call back to schedule follow up appt to review scan.

## 2020-11-12 ENCOUNTER — Ambulatory Visit (INDEPENDENT_AMBULATORY_CARE_PROVIDER_SITE_OTHER): Payer: PPO | Admitting: Internal Medicine

## 2020-11-12 ENCOUNTER — Encounter: Payer: Self-pay | Admitting: Internal Medicine

## 2020-11-12 ENCOUNTER — Other Ambulatory Visit: Payer: Self-pay

## 2020-11-12 VITALS — BP 136/86 | HR 54 | Temp 98.1°F | Resp 16 | Ht 68.0 in | Wt 158.0 lb

## 2020-11-12 DIAGNOSIS — E785 Hyperlipidemia, unspecified: Secondary | ICD-10-CM | POA: Diagnosis not present

## 2020-11-12 DIAGNOSIS — R001 Bradycardia, unspecified: Secondary | ICD-10-CM | POA: Insufficient documentation

## 2020-11-12 DIAGNOSIS — E039 Hypothyroidism, unspecified: Secondary | ICD-10-CM | POA: Diagnosis not present

## 2020-11-12 DIAGNOSIS — Z0000 Encounter for general adult medical examination without abnormal findings: Secondary | ICD-10-CM

## 2020-11-12 DIAGNOSIS — Z1231 Encounter for screening mammogram for malignant neoplasm of breast: Secondary | ICD-10-CM | POA: Insufficient documentation

## 2020-11-12 LAB — HEPATIC FUNCTION PANEL
ALT: 15 U/L (ref 0–35)
AST: 15 U/L (ref 0–37)
Albumin: 4.3 g/dL (ref 3.5–5.2)
Alkaline Phosphatase: 56 U/L (ref 39–117)
Bilirubin, Direct: 0.1 mg/dL (ref 0.0–0.3)
Total Bilirubin: 0.5 mg/dL (ref 0.2–1.2)
Total Protein: 6.4 g/dL (ref 6.0–8.3)

## 2020-11-12 LAB — CBC WITH DIFFERENTIAL/PLATELET
Basophils Absolute: 0 10*3/uL (ref 0.0–0.1)
Basophils Relative: 0.9 % (ref 0.0–3.0)
Eosinophils Absolute: 0.1 10*3/uL (ref 0.0–0.7)
Eosinophils Relative: 2.6 % (ref 0.0–5.0)
HCT: 40.3 % (ref 36.0–46.0)
Hemoglobin: 13.8 g/dL (ref 12.0–15.0)
Lymphocytes Relative: 35.3 % (ref 12.0–46.0)
Lymphs Abs: 1.6 10*3/uL (ref 0.7–4.0)
MCHC: 34.1 g/dL (ref 30.0–36.0)
MCV: 92.6 fl (ref 78.0–100.0)
Monocytes Absolute: 0.5 10*3/uL (ref 0.1–1.0)
Monocytes Relative: 10.5 % (ref 3.0–12.0)
Neutro Abs: 2.4 10*3/uL (ref 1.4–7.7)
Neutrophils Relative %: 50.7 % (ref 43.0–77.0)
Platelets: 216 10*3/uL (ref 150.0–400.0)
RBC: 4.36 Mil/uL (ref 3.87–5.11)
RDW: 13 % (ref 11.5–15.5)
WBC: 4.7 10*3/uL (ref 4.0–10.5)

## 2020-11-12 LAB — BASIC METABOLIC PANEL
BUN: 22 mg/dL (ref 6–23)
CO2: 31 mEq/L (ref 19–32)
Calcium: 9.3 mg/dL (ref 8.4–10.5)
Chloride: 102 mEq/L (ref 96–112)
Creatinine, Ser: 0.77 mg/dL (ref 0.40–1.20)
GFR: 79.13 mL/min (ref >60.00)
Glucose, Bld: 89 mg/dL (ref 70–99)
Potassium: 3.8 mEq/L (ref 3.5–5.1)
Sodium: 140 mEq/L (ref 135–145)

## 2020-11-12 LAB — LIPID PANEL
Cholesterol: 183 mg/dL (ref 0–200)
HDL: 79.5 mg/dL (ref >39.00)
LDL Cholesterol: 79 mg/dL (ref 0–99)
NonHDL: 103.19
Total CHOL/HDL Ratio: 2
Triglycerides: 120 mg/dL (ref 0.0–149.0)
VLDL: 24 mg/dL (ref 0.0–40.0)

## 2020-11-12 LAB — TSH: TSH: 1.68 u[IU]/mL (ref 0.35–5.50)

## 2020-11-12 NOTE — Progress Notes (Signed)
Subjective:  Patient ID: Susan Davila, female    DOB: 03/21/1951  Age: 70 y.o. MRN: [id number]  CC: Annual Exam and Hypothyroidism  This visit occurred during the SARS-CoV-2 public health emergency.  Safety protocols were in place, including screening questions prior to the visit, additional usage of staff PPE, and extensive cleaning of exam room while observing appropriate contact time as indicated for disinfecting solutions.    HPI Susan Davila presents for a CPX and f/up -   She is active and denies any recent episodes of dizziness, lightheadedness, palpitations, near-syncope, dyspnea on exertion, edema, or fatigue.  Outpatient Medications Prior to Visit  Medication Sig Dispense Refill   levothyroxine (SYNTHROID) 25 MCG tablet Take 1 tablet (25 mcg total) by mouth daily before breakfast. 30 tablet 0   No facility-administered medications prior to visit.    ROS Review of Systems  Constitutional:  Negative for appetite change, diaphoresis, fatigue and unexpected weight change.  HENT: Negative.    Respiratory:  Negative for cough, chest tightness, shortness of breath and wheezing.   Cardiovascular:  Negative for chest pain, palpitations and leg swelling.  Gastrointestinal:  Negative for abdominal pain, constipation, diarrhea, nausea and vomiting.  Endocrine: Negative for cold intolerance and heat intolerance.  Genitourinary: Negative.  Negative for difficulty urinating.  Musculoskeletal: Negative.  Negative for arthralgias.  Skin: Negative.   Neurological:  Negative for dizziness, weakness and headaches.  Hematological:  Negative for adenopathy. Does not bruise/bleed easily.  Psychiatric/Behavioral: Negative.     Objective:  BP 136/86 (BP Location: Right Arm, Patient Position: Sitting, Cuff Size: Large)   Pulse (!) 54   Temp 98.1 F (36.7 C) (Oral)   Resp 16   Ht '5\' 8"'$  (1.727 m)   Wt 158 lb (71.7 kg)   SpO2 98%   BMI 24.02 kg/m   BP Readings from Last 3 Encounters:   11/12/20 136/86  07/09/20 140/78  12/05/19 136/80    Wt Readings from Last 3 Encounters:  11/12/20 158 lb (71.7 kg)  07/09/20 162 lb (73.5 kg)  04/23/20 162 lb (73.5 kg)    Physical Exam Vitals reviewed.  HENT:     Nose: Nose normal.     Mouth/Throat:     Mouth: Mucous membranes are moist.  Eyes:     Conjunctiva/sclera: Conjunctivae normal.  Cardiovascular:     Rate and Rhythm: Regular rhythm. Bradycardia present.     Heart sounds: Normal heart sounds, S1 normal and S2 normal. No murmur heard.    Comments: EKG- Sinus bradycardia, 52 bpm Otherwise normal EKG Pulmonary:     Effort: Pulmonary effort is normal.     Breath sounds: No stridor. No wheezing, rhonchi or rales.  Abdominal:     General: Abdomen is flat.     Palpations: There is no mass.     Tenderness: There is no abdominal tenderness. There is no guarding.     Hernia: No hernia is present.  Musculoskeletal:        General: Normal range of motion.     Cervical back: Neck supple.     Right lower leg: No edema.     Left lower leg: No edema.  Lymphadenopathy:     Cervical: No cervical adenopathy.  Skin:    General: Skin is warm and dry.  Neurological:     General: No focal deficit present.     Mental Status: She is alert.  Psychiatric:        Mood and Affect: Mood  normal.        Behavior: Behavior normal.    Lab Results  Component Value Date   WBC 4.7 11/12/2020   HGB 13.8 11/12/2020   HCT 40.3 11/12/2020   PLT 216.0 11/12/2020   GLUCOSE 89 11/12/2020   CHOL 183 11/12/2020   TRIG 120.0 11/12/2020   HDL 79.50 11/12/2020   LDLCALC 79 11/12/2020   ALT 15 11/12/2020   AST 15 11/12/2020   NA 140 11/12/2020   K 3.8 11/12/2020   CL 102 11/12/2020   CREATININE 0.77 11/12/2020   BUN 22 11/12/2020   CO2 31 11/12/2020   TSH 1.68 11/12/2020    CT SHOULDER LEFT WO CONTRAST  Result Date: 09/27/2020 CLINICAL DATA:  Chronic left shoulder pain.  No prior surgery. EXAM: CT OF THE UPPER LEFT EXTREMITY  WITHOUT CONTRAST TECHNIQUE: Multidetector CT imaging of the upper left extremity was performed according to the standard protocol. COMPARISON:  MR arthrogram left shoulder dated April 18, 2020. Left shoulder x-rays dated April 04, 2020. FINDINGS: Bones/Joint/Cartilage No fracture or dislocation.Small glenohumeral joint effusion. Moderate to severe osteoarthritis of the glenohumeral joint with joint space narrowing, subchondral sclerosis, subchondral cystic changes and marginal osteophytosis. Biconcave glenoid morphology. Mild arthropathy of the acromioclavicular joint.  Type II acromion. Ligaments Ligaments are suboptimally evaluated by CT. Muscles and Tendons Grossly intact.  No muscle atrophy. Soft tissue No fluid collection or hematoma.  No soft tissue mass. IMPRESSION: 1. Moderate to severe glenohumeral osteoarthritis. Electronically Signed   By: Titus Dubin M.D.   On: 09/27/2020 09:59    Assessment & Plan:   Rosemond was seen today for annual exam and hypothyroidism.  Diagnoses and all orders for this visit:  Acquired hypothyroidism- Her TSH is in the normal range.  She will stay on the current dose of levothyroxine. -     CBC with Differential/Platelet; Future -     Basic metabolic panel; Future -     TSH; Future -     TSH -     Basic metabolic panel -     CBC with Differential/Platelet -     levothyroxine (SYNTHROID) 25 MCG tablet; Take 1 tablet (25 mcg total) by mouth daily before breakfast.  Hyperlipidemia LDL goal <130- Statin therapy is not indicated. -     Basic metabolic panel; Future -     Lipid panel; Future -     Hepatic function panel; Future -     TSH; Future -     TSH -     Hepatic function panel -     Lipid panel -     Basic metabolic panel  Routine general medical examination at a health care facility- Exam completed, labs reviewed - Statin therapy is not indicated, she refused a pneumonia vaccine, cancer screenings addressed, patient education was  given.  Bradycardia- She is asymptomatic with respect to this.  Labs are negative for secondary causes.  Will continue to monitor. -     EKG 12-Lead  Visit for screening mammogram -     MM DIGITAL SCREENING BILATERAL; Future  I am having Susan Davila maintain her levothyroxine.  Meds ordered this encounter  Medications   levothyroxine (SYNTHROID) 25 MCG tablet    Sig: Take 1 tablet (25 mcg total) by mouth daily before breakfast.    Dispense:  90 tablet    Refill:  1      Follow-up: Return in about 6 months (around 05/15/2021).  Scarlette Calico, MD

## 2020-11-12 NOTE — Patient Instructions (Signed)
Health Maintenance, Female Adopting a healthy lifestyle and getting preventive care are important in promoting health and wellness. Ask your health care provider about: The right schedule for you to have regular tests and exams. Things you can do on your own to prevent diseases and keep yourself healthy. What should I know about diet, weight, and exercise? Eat a healthy diet  Eat a diet that includes plenty of vegetables, fruits, low-fat dairy products, and lean protein. Do not eat a lot of foods that are high in solid fats, added sugars, or sodium.  Maintain a healthy weight Body mass index (BMI) is used to identify weight problems. It estimates body fat based on height and weight. Your health care provider can help determineyour BMI and help you achieve or maintain a healthy weight. Get regular exercise Get regular exercise. This is one of the most important things you can do for your health. Most adults should: Exercise for at least 150 minutes each week. The exercise should increase your heart rate and make you sweat (moderate-intensity exercise). Do strengthening exercises at least twice a week. This is in addition to the moderate-intensity exercise. Spend less time sitting. Even light physical activity can be beneficial. Watch cholesterol and blood lipids Have your blood tested for lipids and cholesterol at 69 years of age, then havethis test every 5 years. Have your cholesterol levels checked more often if: Your lipid or cholesterol levels are high. You are older than 69 years of age. You are at high risk for heart disease. What should I know about cancer screening? Depending on your health history and family history, you may need to have cancer screening at various ages. This may include screening for: Breast cancer. Cervical cancer. Colorectal cancer. Skin cancer. Lung cancer. What should I know about heart disease, diabetes, and high blood pressure? Blood pressure and heart  disease High blood pressure causes heart disease and increases the risk of stroke. This is more likely to develop in people who have high blood pressure readings, are of African descent, or are overweight. Have your blood pressure checked: Every 3-5 years if you are 18-39 years of age. Every year if you are 40 years old or older. Diabetes Have regular diabetes screenings. This checks your fasting blood sugar level. Have the screening done: Once every three years after age 40 if you are at a normal weight and have a low risk for diabetes. More often and at a younger age if you are overweight or have a high risk for diabetes. What should I know about preventing infection? Hepatitis B If you have a higher risk for hepatitis B, you should be screened for this virus. Talk with your health care provider to find out if you are at risk forhepatitis B infection. Hepatitis C Testing is recommended for: Everyone born from 1945 through 1965. Anyone with known risk factors for hepatitis C. Sexually transmitted infections (STIs) Get screened for STIs, including gonorrhea and chlamydia, if: You are sexually active and are younger than 69 years of age. You are older than 69 years of age and your health care provider tells you that you are at risk for this type of infection. Your sexual activity has changed since you were last screened, and you are at increased risk for chlamydia or gonorrhea. Ask your health care provider if you are at risk. Ask your health care provider about whether you are at high risk for HIV. Your health care provider may recommend a prescription medicine to help   prevent HIV infection. If you choose to take medicine to prevent HIV, you should first get tested for HIV. You should then be tested every 3 months for as long as you are taking the medicine. Pregnancy If you are about to stop having your period (premenopausal) and you may become pregnant, seek counseling before you get  pregnant. Take 400 to 800 micrograms (mcg) of folic acid every day if you become pregnant. Ask for birth control (contraception) if you want to prevent pregnancy. Osteoporosis and menopause Osteoporosis is a disease in which the bones lose minerals and strength with aging. This can result in bone fractures. If you are 65 years old or older, or if you are at risk for osteoporosis and fractures, ask your health care provider if you should: Be screened for bone loss. Take a calcium or vitamin D supplement to lower your risk of fractures. Be given hormone replacement therapy (HRT) to treat symptoms of menopause. Follow these instructions at home: Lifestyle Do not use any products that contain nicotine or tobacco, such as cigarettes, e-cigarettes, and chewing tobacco. If you need help quitting, ask your health care provider. Do not use street drugs. Do not share needles. Ask your health care provider for help if you need support or information about quitting drugs. Alcohol use Do not drink alcohol if: Your health care provider tells you not to drink. You are pregnant, may be pregnant, or are planning to become pregnant. If you drink alcohol: Limit how much you use to 0-1 drink a day. Limit intake if you are breastfeeding. Be aware of how much alcohol is in your drink. In the [place] one drink equals one 12 oz bottle of beer (355 mL), one 5 oz glass of wine (148 mL), or one 1 oz glass of hard liquor (44 mL). General instructions Schedule regular health, dental, and eye exams. Stay current with your vaccines. Tell your health care provider if: You often feel depressed. You have ever been abused or do not feel safe at home. Summary Adopting a healthy lifestyle and getting preventive care are important in promoting health and wellness. Follow your health care provider's instructions about healthy diet, exercising, and getting tested or screened for diseases. Follow your health care provider's  instructions on monitoring your cholesterol and blood pressure. This information is not intended to replace advice given to you by your health care provider. Make sure you discuss any questions you have with your healthcare provider. Document Revised: 03/31/2018 Document Reviewed: 03/31/2018 Elsevier Patient Education  2022 Elsevier Inc.  

## 2020-11-13 ENCOUNTER — Encounter: Payer: Self-pay | Admitting: Internal Medicine

## 2020-11-13 MED ORDER — LEVOTHYROXINE SODIUM 25 MCG PO TABS: 25.0000 ug | ORAL_TABLET | Freq: Every day | ORAL | 1 refills | Status: DC

## 2020-12-21 ENCOUNTER — Telehealth: Payer: Self-pay | Admitting: Internal Medicine

## 2020-12-21 NOTE — Telephone Encounter (Signed)
Left message for patient to call me back at [phone] to schedule Medicare Annual Wellness Visit   No hx of AWV eligible as of 02/19/18  Please schedule at anytime with [facility] Advisor if patient calls the office back.    60 Minutes appointment   Any questions, please call me at [phone]

## 2020-12-25 ENCOUNTER — Ambulatory Visit: Payer: PPO | Admitting: Podiatry

## 2020-12-25 ENCOUNTER — Other Ambulatory Visit: Payer: Self-pay

## 2020-12-25 DIAGNOSIS — M722 Plantar fascial fibromatosis: Secondary | ICD-10-CM

## 2020-12-25 DIAGNOSIS — Q667 Congenital pes cavus, unspecified foot: Secondary | ICD-10-CM | POA: Diagnosis not present

## 2020-12-26 ENCOUNTER — Ambulatory Visit (INDEPENDENT_AMBULATORY_CARE_PROVIDER_SITE_OTHER): Payer: PPO | Admitting: Orthopedic Surgery

## 2020-12-26 ENCOUNTER — Encounter: Payer: Self-pay | Admitting: Orthopedic Surgery

## 2020-12-26 DIAGNOSIS — M19012 Primary osteoarthritis, left shoulder: Secondary | ICD-10-CM

## 2020-12-26 NOTE — Progress Notes (Signed)
Office Visit Note   Patient: Susan Davila           Date of Birth: [birth year]           MRN: [id number] Visit Date: 12/26/2020 Requested by: Janith Lima, MD [address] [place],  [place] [address] PCP: Janith Lima, MD  Subjective: Chief Complaint  Patient presents with   Left Shoulder - Follow-up    HPI: Susan Davila is a 70 year old patient with left shoulder pain.  She has had a CT scan of the shoulder which shows mild biconcave glenoid with some posterior subluxation of the humeral head.  MRI scan shows intact rotator cuff.  Significant arthritis is present.  Patient works as a Research scientist (physical sciences) and she is very happy working and would like to return to work as soon as possible after surgery.  She has taken ibuprofen for her symptoms.  No cardiac or diabetic disease.  She reports no neck pain but does report a lot of pain at night which wakes her up frequently.  She describes pain with activities of daily living.               ROS: All systems reviewed are negative as they relate to the chief complaint within the history of present illness.  Patient denies  fevers or chills.  Assessment & Plan: Visit Diagnoses:  1. Primary osteoarthritis of left shoulder     Plan: Impression is left shoulder arthritis.  Plan is left shoulder replacement.  Would like to do convertible glenoid with total shoulder replacement.  Rotator cuff strength is good but her range of motion is slightly limited.  Risk and benefits of surgical intervention are discussed including not limited to infection nerve vessel damage instability incomplete pain relief as well as possibility of conversion to reverse replacement if rotator cuff failure recurs in the future.  Patient understands risk and benefits and wishes to proceed with surgery.  All questions answered.  Follow-Up Instructions: No follow-ups on file.   Orders:  No orders of the defined types were placed in this encounter.  No orders of the defined types  were placed in this encounter.     Procedures: No procedures performed   Clinical Data: No additional findings.  Objective: Vital Signs: There were no vitals taken for this visit.  Physical Exam:   Constitutional: Patient appears well-developed HEENT:  Head: Normocephalic Eyes:EOM are normal Neck: Normal range of motion Cardiovascular: Normal rate Pulmonary/chest: Effort normal Neurologic: Patient is alert Skin: Skin is warm Psychiatric: Patient has normal mood and affect   Ortho Exam: Ortho exam demonstrates painful range of motion with the left shoulder.  Passive range of motion is 25/45/130.  Rotator cuff strength is good infraspinatus supraspinatus and subscap muscle testing.  No AC joint tenderness is present.  Deltoid is functional.  Motor or sensory function to the hand is intact.  Specialty Comments:  No specialty comments available.  Imaging: No results found.   PMFS History: Patient Active Problem List   Diagnosis Date Noted   Bradycardia 11/12/2020   Visit for screening mammogram 11/12/2020   Routine general medical examination at a health care facility 07/13/2019   Acquired hypothyroidism 07/13/2019   Hyperlipidemia LDL goal <130 07/13/2019   PULMONARY NODULE 01/31/2010   Past Medical History:  Diagnosis Date   Thyroid disease     Family History  Problem Relation Age of Onset   Dementia Mother 38   Alcohol abuse Father 38    Past  Surgical History:  Procedure Laterality Date   BLADDER REMOVAL     COSMETIC SURGERY     EYE SURGERY     LIPOSUCTION     Social History   Occupational History   Not on file  Tobacco Use   Smoking status: Former    Types: Cigarettes    Quit date: 09/15/1978    Years since quitting: 42.3   Smokeless tobacco: Never  Vaping Use   Vaping Use: Never used  Substance and Sexual Activity   Alcohol use: Yes    Alcohol/week: 20.0 standard drinks    Types: 20 Glasses of wine per week   Drug use: Never   Sexual  activity: Yes    Partners: Male    Birth control/protection: None

## 2020-12-28 ENCOUNTER — Encounter: Payer: Self-pay | Admitting: Podiatry

## 2020-12-28 NOTE — Progress Notes (Signed)
  Subjective:  Patient ID: Susan Davila, female    DOB: [birth year],  MRN: [id number]  Chief Complaint  Patient presents with   Injections    Left foot injection     70 y.o. female presents with the above complaint.  Patient presents with complaint of left Planter fasciitis.  Patient states that she has a high arch foot structure which leads to a lot of pressure and she started to have heel pain again.  She was treated by Dr. Cannon Kettle in the past.  She wanted to get it evaluated to see if the plantar fasciitis pain is coming back and she would like to do an injection to help improve it.  She has not seen anyone else prior to seeing me in few months.  She would like to discuss treatment options for this.   Review of Systems: Negative except as noted in the HPI. Denies N/V/F/Ch.  Past Medical History:  Diagnosis Date   Thyroid disease     Current Outpatient Medications:    levothyroxine (SYNTHROID) 25 MCG tablet, Take 1 tablet (25 mcg total) by mouth daily before breakfast., Disp: 90 tablet, Rfl: 1  Social History   Tobacco Use  Smoking Status Former   Types: Cigarettes   Quit date: 09/15/1978   Years since quitting: 42.3  Smokeless Tobacco Never    No Known Allergies Objective:  There were no vitals filed for this visit. There is no height or weight on file to calculate BMI. Constitutional Well developed. Well nourished.  Vascular Dorsalis pedis pulses palpable bilaterally. Posterior tibial pulses palpable bilaterally. Capillary refill normal to all digits.  No cyanosis or clubbing noted. Pedal hair growth normal.  Neurologic Normal speech. Oriented to person, place, and time. Epicritic sensation to light touch grossly present bilaterally.  Dermatologic Nails well groomed and normal in appearance. No open wounds. No skin lesions.  Orthopedic: Normal joint ROM without pain or crepitus bilaterally. No visible deformities. Tender to palpation at the calcaneal tuber  left. No pain with calcaneal squeeze left. Ankle ROM diminished range of motion left. Silfverskiold Test: positive left.   Radiographs: None  Assessment:   1. Plantar fasciitis, left   2. Pes cavus    Plan:  Patient was evaluated and treated and all questions answered.  Plantar Fasciitis, left - XR reviewed as above.  - Educated on icing and stretching. Instructions given.  - Injection delivered to the plantar fascia as below. - DME: Continue wearing the brace - Pharmacologic management: None  Pes cavus foot structure -I explained the patient etiology of pes cavus and various treatment options were extensively discussed.  Given the high arch nature of the foot with a very tight plantar fascial leading to plantar fasciitis I believe patient will benefit from custom-made orthotics to help control the hindfoot motion support the arch of the foot take the stress away from plantar fascial.  Patient states understanding to proceed with custom orthotics -Patient was casted for orthotics  Procedure: Injection Tendon/Ligament Location: Left plantar fascia at the glabrous junction; medial approach. Skin Prep: alcohol Injectate: 0.5 cc 0.5% marcaine plain, 0.5 cc of 1% Lidocaine, 0.5 cc kenalog 10. Disposition: Patient tolerated procedure well. Injection site dressed with a band-aid.  No follow-ups on file.

## 2020-12-31 ENCOUNTER — Telehealth: Payer: Self-pay | Admitting: Podiatry

## 2020-12-31 NOTE — Telephone Encounter (Signed)
Received [employer] auth # [id number] for orthotics [phone] X2) valid 9.14.22 thru 12.13.2022.

## 2021-01-15 ENCOUNTER — Telehealth: Payer: Self-pay | Admitting: Orthopedic Surgery

## 2021-01-15 NOTE — Telephone Encounter (Signed)
Tried calling. No answer. No VM Patient may need to stay one night in hospital for shoulder arthroplasty but could also potentially go home same day---just depending on how she does post operatively

## 2021-01-15 NOTE — Telephone Encounter (Signed)
Pt called and is wondering if after she has shoulder surgery will she be staying the night?   CB [phone]

## 2021-01-21 ENCOUNTER — Encounter: Payer: Self-pay | Admitting: Orthopedic Surgery

## 2021-01-23 ENCOUNTER — Telehealth: Payer: Self-pay | Admitting: Orthopedic Surgery

## 2021-01-23 NOTE — Telephone Encounter (Signed)
[employer] forms received. To [employer]. 

## 2021-01-24 ENCOUNTER — Telehealth: Payer: Self-pay | Admitting: Orthopedic Surgery

## 2021-01-24 NOTE — Telephone Encounter (Signed)
[employer] forms received. To [employer]. 

## 2021-01-25 NOTE — Telephone Encounter (Signed)
Patient was sent information regarding her surgery in the form of an e-mail on 01-24-21 at 4:59pm.

## 2021-01-31 ENCOUNTER — Other Ambulatory Visit: Payer: Self-pay

## 2021-02-04 ENCOUNTER — Ambulatory Visit: Payer: PPO

## 2021-02-13 ENCOUNTER — Ambulatory Visit (INDEPENDENT_AMBULATORY_CARE_PROVIDER_SITE_OTHER): Payer: PPO | Admitting: Podiatry

## 2021-02-13 ENCOUNTER — Other Ambulatory Visit: Payer: Self-pay

## 2021-02-13 DIAGNOSIS — M722 Plantar fascial fibromatosis: Secondary | ICD-10-CM | POA: Diagnosis not present

## 2021-02-13 DIAGNOSIS — Q667 Congenital pes cavus, unspecified foot: Secondary | ICD-10-CM | POA: Diagnosis not present

## 2021-02-13 DIAGNOSIS — M7672 Peroneal tendinitis, left leg: Secondary | ICD-10-CM

## 2021-02-13 NOTE — Patient Instructions (Signed)

## 2021-02-13 NOTE — Progress Notes (Signed)
Patient presents today for orthotic pick up. Patient voices no new complaints.  Orthotics were fitted to patient's feet. No discomfort and no rubbing. Patient satisfied with the orthotics.  Orthotics were dispensed to patient with instructions for break in wear and to call the office with any concerns or questions. 

## 2021-02-15 ENCOUNTER — Encounter: Payer: PPO | Admitting: Podiatry

## 2021-02-19 NOTE — Pre-Procedure Instructions (Signed)
Surgical Instructions    Your procedure is scheduled on Tuesday, November 15th.  Report to [facility] Main Entrance "A" at 5:30 A.M., then check in with the Admitting office.  Call this number if you have problems the morning of surgery:  [phone]   If you have any questions prior to your surgery date call [phone]: Open Monday-Friday 8am-4pm    Remember:  Do not eat after midnight the night before your surgery  You may drink clear liquids until 4:30 a.m. the morning of your surgery.   Clear liquids allowed are: Water, Non-Citrus Juices (without pulp), Carbonated Beverages, Clear Tea, Black Coffee Only, and Gatorade.   Enhanced Recovery after Surgery for Orthopedics Enhanced Recovery after Surgery is a protocol used to improve the stress on your body and your recovery after surgery.  Patient Instructions  The day of surgery (if you do NOT have diabetes):  Drink ONE (1) Pre-Surgery Clear Ensure by 4:30 am the morning of surgery   This drink was given to you during your hospital  pre-op appointment visit. Nothing else to drink after completing the  Pre-Surgery Clear Ensure.         If you have questions, please contact your surgeon's office.     Take these medicines the morning of surgery with A SIP OF WATER  levothyroxine (SYNTHROID)   As of today, STOP taking any Aspirin (unless otherwise instructed by your surgeon) Aleve, Naproxen, Ibuprofen, Motrin, Advil, Goody's, BC's, all herbal medications, fish oil, and all vitamins.                     Do NOT Smoke (Tobacco/Vaping) or drink Alcohol 24 hours prior to your procedure.  If you use a CPAP at night, you may bring all equipment for your overnight stay.   Contacts, glasses, piercing's, hearing aid's, dentures or partials may not be worn into surgery, please bring cases for these belongings.    For patients admitted to the hospital, discharge time will be determined by your treatment team.   Patients discharged  the day of surgery will not be allowed to drive home, and someone needs to stay with them for 24 hours.  NO VISITORS WILL BE ALLOWED IN PRE-OP WHERE PATIENTS GET READY FOR SURGERY.  ONLY 1 SUPPORT PERSON MAY BE PRESENT IN THE WAITING ROOM WHILE YOU ARE IN SURGERY.  IF YOU ARE TO BE ADMITTED, ONCE YOU ARE IN YOUR ROOM YOU WILL BE ALLOWED TWO (2) VISITORS.  Minor children may have two parents present. Special consideration for safety and communication needs will be reviewed on a case by case basis.   Special instructions:                                    [facility]- Preparing for Total Shoulder Arthroplasty   Before surgery, you can play an important role. Because skin is not sterile, your skin needs to be as free of germs as possible. You can reduce the number of germs on your skin by using the following products. Benzoyl Peroxide Gel Reduces the number of germs present on the skin Applied twice a day to shoulder area starting two days before surgery   Chlorhexidine Gluconate (CHG) Soap An antiseptic cleaner that kills germs and bonds with the skin to continue killing germs even after washing Used for showering the night before surgery and morning of surgery   Oral Hygiene  is also important to reduce your risk of infection.                                    Remember - BRUSH YOUR TEETH THE MORNING OF SURGERY WITH YOUR REGULAR TOOTHPASTE  ==================================================================  Please follow these instructions carefully:  BENZOYL PEROXIDE 5% GEL  Please do not use if you have an allergy to benzoyl peroxide.   If your skin becomes reddened/irritated stop using the benzoyl peroxide.  Starting two days before surgery, apply as follows: Apply benzoyl peroxide in the morning and at night. Apply after taking a shower. If you are not taking a shower clean entire shoulder front, back, and side along with the armpit with a clean wet washcloth.  Place a quarter-sized  dollop on your shoulder and rub in thoroughly, making sure to cover the front, back, and side of your shoulder, along with the armpit.   2 days before ____ AM   ____ PM              1 day before ____ AM   ____ PM                             Do this twice a day for two days.  (Last application is the night before surgery, AFTER using the CHG soap as described below).  Do NOT apply benzoyl peroxide gel on the day of surgery.  CHLORHEXIDINE GLUCONATE (CHG) SOAP  Please do not use if you have an allergy to CHG or antibacterial soaps. If your skin becomes reddened/irritated stop using the CHG.   Do not shave (including legs and underarms) for at least 48 hours prior to first CHG shower. It is OK to shave your face.  Starting the night before surgery, use CHG soap as follows:  Shower the NIGHT BEFORE SURGERY and MORNING OF SURGERY with CHG.  If you choose to wash your hair, wash your hair first as usual with your normal shampoo.  After shampooing, rinse your hair and body thoroughly to remove the shampoo.  Use CHG as you would any other liquid soap.  You can apply CHG directly to the skin and wash gently with a scrungie or a clean washcloth.  Apply the CHG soap to your body ONLY FROM THE NECK DOWN.  Do not use on open wounds or open sores.  Avoid contact with your eyes, ears, mouth, and genitals (private parts).  Wash face and genitals (private parts) with your normal soap.  Wash thoroughly, paying special attention to the area where your surgery will be performed.  Thoroughly rinse your body with warm water from the neck down.  DO NOT shower/wash with your normal soap after using and rinsing off the CHG soap.   Pat yourself dry with a CLEAN TOWEL.    Apply benzoyl peroxide.   Wear CLEAN PAJAMAS to bed the night before surgery; wear comfortable clothes the morning of surgery.  Place CLEAN SHEETS on your bed the night of your first shower and DO NOT SLEEP WITH PETS.   Day of  Surgery: Shower with CHG soap. Do not wear jewelry, make up, nail polish, gel polish, artificial nails, or any other type of covering on natural nails including finger and toenails. If patients have artificial nails, gel coating, etc. that need to be removed by a nail salon please have  this removed prior to surgery. Surgery may need to be canceled/delayed if the surgeon/ anesthesia feels like the patient is unable to be adequately monitored. Do not wear lotions, powders, perfumes/colognes, or deodorant. Do not shave 48 hours prior to surgery.  Men may shave face and neck. Do not bring valuables to the hospital. [facility] is not responsible for any belongings or valuables. Wear Clean/Comfortable clothing the morning of surgery Remember to brush your teeth WITH YOUR REGULAR TOOTHPASTE.   Please read over the following fact sheets that you were given.   3 days prior to your procedure or After your COVID test   You are not required to quarantine however you are required to wear a well-fitting mask when you are out and around people not in your household. If your mask becomes wet or soiled, replace with a new one.   Wash your hands often with soap and water for 20 seconds or clean your hands with an alcohol-based hand sanitizer that contains at least 60% alcohol.   Do not share personal items.   Notify your provider:  o if you are in close contact with someone who has COVID  o or if you develop a fever of 100.4 or greater, sneezing, cough, sore throat, shortness of breath or body aches.

## 2021-02-20 ENCOUNTER — Encounter (HOSPITAL_COMMUNITY)
Admission: RE | Admit: 2021-02-20 | Discharge: 2021-02-20 | Disposition: A | Discharge status: Home / Self Care | Payer: PPO | Source: Ambulatory Visit | Attending: Orthopedic Surgery | Admitting: Orthopedic Surgery

## 2021-02-20 ENCOUNTER — Ambulatory Visit
Admission: RE | Admit: 2021-02-20 | Discharge: 2021-02-20 | Disposition: A | Discharge status: Home / Self Care | Payer: PPO | Source: Ambulatory Visit | Attending: Internal Medicine | Admitting: Internal Medicine

## 2021-02-20 ENCOUNTER — Other Ambulatory Visit: Payer: Self-pay

## 2021-02-20 ENCOUNTER — Encounter (HOSPITAL_COMMUNITY): Payer: Self-pay

## 2021-02-20 VITALS — BP 143/80 | HR 59 | Temp 97.9°F | Resp 17 | Ht 68.0 in | Wt 155.7 lb

## 2021-02-20 DIAGNOSIS — Z01812 Encounter for preprocedural laboratory examination: Secondary | ICD-10-CM | POA: Insufficient documentation

## 2021-02-20 DIAGNOSIS — Z01818 Encounter for other preprocedural examination: Secondary | ICD-10-CM

## 2021-02-20 DIAGNOSIS — Z1231 Encounter for screening mammogram for malignant neoplasm of breast: Secondary | ICD-10-CM

## 2021-02-20 LAB — URINALYSIS, ROUTINE W REFLEX MICROSCOPIC
Bilirubin Urine: NEGATIVE
Glucose, UA: NEGATIVE mg/dL
Hgb urine dipstick: NEGATIVE
Ketones, ur: NEGATIVE mg/dL
Leukocytes,Ua: NEGATIVE
Nitrite: NEGATIVE
Protein, ur: NEGATIVE mg/dL
Specific Gravity, Urine: 1.008 (ref 1.005–1.030)
pH: 7 (ref 5.0–8.0)

## 2021-02-20 LAB — CBC
HCT: 41.5 % (ref 36.0–46.0)
Hemoglobin: 13.9 g/dL (ref 12.0–15.0)
MCH: 31.4 pg (ref 26.0–34.0)
MCHC: 33.5 g/dL (ref 30.0–36.0)
MCV: 93.9 fL (ref 80.0–100.0)
Platelets: 226 10*3/uL (ref 150–400)
RBC: 4.42 MIL/uL (ref 3.87–5.11)
RDW: 12 % (ref 11.5–15.5)
WBC: 4.4 10*3/uL (ref 4.0–10.5)
nRBC: 0 % (ref 0.0–0.2)

## 2021-02-20 LAB — BASIC METABOLIC PANEL
Anion gap: 6 (ref 5–15)
BUN: 13 mg/dL (ref 8–23)
CO2: 30 mmol/L (ref 22–32)
Calcium: 9.1 mg/dL (ref 8.9–10.3)
Chloride: 102 mmol/L (ref 98–111)
Creatinine, Ser: 0.62 mg/dL (ref 0.44–1.00)
GFR, Estimated: >60 mL/min (ref >60)
Glucose, Bld: 89 mg/dL (ref 70–99)
Potassium: 3.5 mmol/L (ref 3.5–5.1)
Sodium: 138 mmol/L (ref 135–145)

## 2021-02-20 LAB — SURGICAL PCR SCREEN
MRSA, PCR: NEGATIVE
Staphylococcus aureus: NEGATIVE

## 2021-02-20 NOTE — Progress Notes (Signed)
PCP - Dr. Scarlette Calico  Chest x-ray - Not indicated EKG - 11/12/20  Sleep Study - Denies  DM - Denies  ERAS Protcol - Yes  PRE-SURGERY Ensure given   COVID TEST- 03/01/21   Anesthesia review: No  Patient denies shortness of breath, fever, cough and chest pain at PAT appointment   All instructions explained to the patient, with a verbal understanding of the material. Patient agrees to go over the instructions while at home for a better understanding. Patient also instructed to wear a mask while in public after being tested for COVID-19. The opportunity to ask questions was provided.

## 2021-02-22 ENCOUNTER — Other Ambulatory Visit: Payer: Self-pay | Admitting: Internal Medicine

## 2021-02-22 DIAGNOSIS — R928 Other abnormal and inconclusive findings on diagnostic imaging of breast: Secondary | ICD-10-CM

## 2021-02-22 LAB — URINE CULTURE: Culture: NO GROWTH

## 2021-03-01 ENCOUNTER — Other Ambulatory Visit (HOSPITAL_COMMUNITY)
Admission: RE | Admit: 2021-03-01 | Discharge: 2021-03-01 | Disposition: A | Discharge status: Home / Self Care | Payer: PPO | Source: Ambulatory Visit | Attending: Orthopedic Surgery | Admitting: Orthopedic Surgery

## 2021-03-01 DIAGNOSIS — Z20822 Contact with and (suspected) exposure to covid-19: Secondary | ICD-10-CM | POA: Insufficient documentation

## 2021-03-01 DIAGNOSIS — Z01812 Encounter for preprocedural laboratory examination: Secondary | ICD-10-CM | POA: Insufficient documentation

## 2021-03-01 DIAGNOSIS — Z01818 Encounter for other preprocedural examination: Secondary | ICD-10-CM

## 2021-03-01 LAB — SARS CORONAVIRUS 2 (TAT 6-24 HRS): SARS Coronavirus 2: NEGATIVE

## 2021-03-04 ENCOUNTER — Encounter (HOSPITAL_COMMUNITY): Payer: Self-pay | Admitting: Orthopedic Surgery

## 2021-03-04 NOTE — Anesthesia Preprocedure Evaluation (Addendum)
Anesthesia Evaluation  Patient identified by MRN, date of birth, ID band Patient awake    Reviewed: Allergy & Precautions, NPO status , Patient's Chart, lab work & pertinent test results  Airway Mallampati: II  TM Distance: >3 FB Neck ROM: Full    Dental  (+) Dental Advisory Given, Teeth Intact, Chipped,    Pulmonary former smoker,    Pulmonary exam normal breath sounds clear to auscultation       Cardiovascular negative cardio ROS Normal cardiovascular exam Rhythm:Regular Rate:Normal     Neuro/Psych negative neurological ROS  negative psych ROS   GI/Hepatic negative GI ROS, (+)     substance abuse  alcohol use,   Endo/Other  Hypothyroidism   Renal/GU negative Renal ROS  negative genitourinary   Musculoskeletal L shoulder OA    Abdominal   Peds  Hematology negative hematology ROS (+) hct 41.5   Anesthesia Other Findings   Reproductive/Obstetrics negative OB ROS                            Anesthesia Physical Anesthesia Plan  ASA: 1  Anesthesia Plan: General and Regional   Post-op Pain Management: GA combined w/ Regional for post-op pain   Induction: Intravenous  PONV Risk Score and Plan: 3 and Ondansetron, Dexamethasone, Midazolam and Treatment may vary due to age or medical condition  Airway Management Planned: Oral ETT  Additional Equipment:   Intra-op Plan:   Post-operative Plan: Extubation in OR  Informed Consent: I have reviewed the patients History and Physical, chart, labs and discussed the procedure including the risks, benefits and alternatives for the proposed anesthesia with the patient or authorized representative who has indicated his/her understanding and acceptance.     Dental advisory given  Plan Discussed with: CRNA  Anesthesia Plan Comments:        Anesthesia Quick Evaluation

## 2021-03-05 ENCOUNTER — Ambulatory Visit (HOSPITAL_COMMUNITY): Payer: PPO

## 2021-03-05 ENCOUNTER — Encounter (HOSPITAL_COMMUNITY)
Admission: RE | Disposition: A | Discharge status: Home / Self Care | Payer: Self-pay | Source: Home / Self Care | Attending: Orthopedic Surgery

## 2021-03-05 ENCOUNTER — Other Ambulatory Visit: Payer: Self-pay

## 2021-03-05 ENCOUNTER — Encounter (HOSPITAL_COMMUNITY): Payer: Self-pay | Admitting: Orthopedic Surgery

## 2021-03-05 ENCOUNTER — Ambulatory Visit (HOSPITAL_COMMUNITY): Payer: PPO | Admitting: General Practice

## 2021-03-05 ENCOUNTER — Observation Stay (HOSPITAL_COMMUNITY)
Admission: RE | Admit: 2021-03-05 | Discharge: 2021-03-07 | Disposition: A | Discharge status: Home / Self Care | Payer: PPO | Attending: Orthopedic Surgery | Admitting: Orthopedic Surgery

## 2021-03-05 DIAGNOSIS — Z79899 Other long term (current) drug therapy: Secondary | ICD-10-CM | POA: Diagnosis not present

## 2021-03-05 DIAGNOSIS — E039 Hypothyroidism, unspecified: Secondary | ICD-10-CM | POA: Insufficient documentation

## 2021-03-05 DIAGNOSIS — M19012 Primary osteoarthritis, left shoulder: Secondary | ICD-10-CM | POA: Diagnosis not present

## 2021-03-05 DIAGNOSIS — Z9889 Other specified postprocedural states: Secondary | ICD-10-CM

## 2021-03-05 DIAGNOSIS — Z96642 Presence of left artificial hip joint: Secondary | ICD-10-CM | POA: Diagnosis not present

## 2021-03-05 DIAGNOSIS — Z471 Aftercare following joint replacement surgery: Secondary | ICD-10-CM | POA: Diagnosis not present

## 2021-03-05 DIAGNOSIS — E785 Hyperlipidemia, unspecified: Secondary | ICD-10-CM | POA: Diagnosis not present

## 2021-03-05 DIAGNOSIS — Z87891 Personal history of nicotine dependence: Secondary | ICD-10-CM | POA: Insufficient documentation

## 2021-03-05 DIAGNOSIS — Z01818 Encounter for other preprocedural examination: Secondary | ICD-10-CM

## 2021-03-05 DIAGNOSIS — Z96612 Presence of left artificial shoulder joint: Secondary | ICD-10-CM

## 2021-03-05 DIAGNOSIS — G8918 Other acute postprocedural pain: Secondary | ICD-10-CM | POA: Diagnosis not present

## 2021-03-05 HISTORY — PX: TOTAL SHOULDER ARTHROPLASTY: SHX126

## 2021-03-05 SURGERY — ARTHROPLASTY, SHOULDER, TOTAL
Anesthesia: Regional | Site: Shoulder | Laterality: Left

## 2021-03-05 MED ORDER — ONDANSETRON HCL 4 MG/2ML IJ SOLN
INTRAMUSCULAR | Status: DC | PRN
  Administered 2021-03-05: 4 mg via INTRAVENOUS

## 2021-03-05 MED ORDER — IRRISEPT - 450ML BOTTLE WITH 0.05% CHG IN STERILE WATER, USP 99.95% OPTIME
TOPICAL | Status: DC | PRN
  Administered 2021-03-05 (×2): 450 mL via TOPICAL

## 2021-03-05 MED ORDER — CHLORHEXIDINE GLUCONATE 0.12 % MT SOLN
OROMUCOSAL | Status: AC
  Administered 2021-03-05: 15 mL via OROMUCOSAL
  Filled 2021-03-05: qty 15

## 2021-03-05 MED ORDER — OXYCODONE HCL 5 MG PO TABS: 5.0000 mg | ORAL_TABLET | Freq: Once | ORAL | Status: DC | PRN

## 2021-03-05 MED ORDER — ONDANSETRON HCL 4 MG/2ML IJ SOLN
INTRAMUSCULAR | Status: AC
  Filled 2021-03-05: qty 2

## 2021-03-05 MED ORDER — DOCUSATE SODIUM 100 MG PO CAPS
100.0000 mg | ORAL_CAPSULE | Freq: Two times a day (BID) | ORAL | Status: DC
  Administered 2021-03-05 – 2021-03-07 (×2): 100 mg via ORAL
  Filled 2021-03-05 (×4): qty 1

## 2021-03-05 MED ORDER — BUPIVACAINE-EPINEPHRINE 0.5% -1:200000 IJ SOLN
INTRAMUSCULAR | Status: AC
  Filled 2021-03-05: qty 1

## 2021-03-05 MED ORDER — LEVOTHYROXINE SODIUM 25 MCG PO TABS
25.0000 ug | ORAL_TABLET | Freq: Every day | ORAL | Status: DC
  Administered 2021-03-06 – 2021-03-07 (×2): 25 ug via ORAL
  Filled 2021-03-05 (×2): qty 1

## 2021-03-05 MED ORDER — FENTANYL CITRATE (PF) 250 MCG/5ML IJ SOLN
INTRAMUSCULAR | Status: AC
  Filled 2021-03-05: qty 5

## 2021-03-05 MED ORDER — VANCOMYCIN HCL 1000 MG IV SOLR
INTRAVENOUS | Status: DC | PRN
  Administered 2021-03-05: 1 g via TOPICAL

## 2021-03-05 MED ORDER — OXYCODONE HCL 5 MG/5ML PO SOLN: 5.0000 mg | Freq: Once | ORAL | Status: DC | PRN

## 2021-03-05 MED ORDER — METHOCARBAMOL 500 MG PO TABS
500.0000 mg | ORAL_TABLET | Freq: Four times a day (QID) | ORAL | Status: DC | PRN
  Administered 2021-03-05: 500 mg via ORAL
  Filled 2021-03-05: qty 1

## 2021-03-05 MED ORDER — EPHEDRINE SULFATE-NACL 50-0.9 MG/10ML-% IV SOSY
PREFILLED_SYRINGE | INTRAVENOUS | Status: DC | PRN
  Administered 2021-03-05 (×2): 2.5 mg via INTRAVENOUS

## 2021-03-05 MED ORDER — LIDOCAINE 2% (20 MG/ML) 5 ML SYRINGE
INTRAMUSCULAR | Status: AC
  Filled 2021-03-05: qty 5

## 2021-03-05 MED ORDER — ROCURONIUM BROMIDE 10 MG/ML (PF) SYRINGE
PREFILLED_SYRINGE | INTRAVENOUS | Status: AC
  Filled 2021-03-05: qty 10

## 2021-03-05 MED ORDER — PHENOL 1.4 % MT LIQD: 1.0000 | OROMUCOSAL | Status: DC | PRN

## 2021-03-05 MED ORDER — FENTANYL CITRATE (PF) 250 MCG/5ML IJ SOLN
INTRAMUSCULAR | Status: DC | PRN
  Administered 2021-03-05 (×4): 50 ug via INTRAVENOUS

## 2021-03-05 MED ORDER — 0.9 % SODIUM CHLORIDE (POUR BTL) OPTIME
TOPICAL | Status: DC | PRN
  Administered 2021-03-05 (×5): 1000 mL

## 2021-03-05 MED ORDER — POVIDONE-IODINE 7.5 % EX SOLN: Freq: Once | CUTANEOUS | Status: AC

## 2021-03-05 MED ORDER — EPINEPHRINE PF 1 MG/ML IJ SOLN
INTRAMUSCULAR | Status: AC
  Filled 2021-03-05: qty 2

## 2021-03-05 MED ORDER — POVIDONE-IODINE 10 % EX SWAB: 2.0000 "application " | Freq: Once | CUTANEOUS | Status: DC

## 2021-03-05 MED ORDER — PROMETHAZINE HCL 25 MG/ML IJ SOLN: 6.2500 mg | INTRAMUSCULAR | Status: DC | PRN

## 2021-03-05 MED ORDER — LACTATED RINGERS IV SOLN: INTRAVENOUS | Status: DC

## 2021-03-05 MED ORDER — METHOCARBAMOL 1000 MG/10ML IJ SOLN
500.0000 mg | Freq: Four times a day (QID) | INTRAVENOUS | Status: DC | PRN
  Filled 2021-03-05: qty 5

## 2021-03-05 MED ORDER — HYDROMORPHONE HCL 1 MG/ML IJ SOLN: 0.2500 mg | INTRAMUSCULAR | Status: DC | PRN

## 2021-03-05 MED ORDER — CEFAZOLIN SODIUM-DEXTROSE 1-4 GM/50ML-% IV SOLN
1.0000 g | Freq: Three times a day (TID) | INTRAVENOUS | Status: AC
  Administered 2021-03-05 – 2021-03-06 (×3): 1 g via INTRAVENOUS
  Filled 2021-03-05 (×4): qty 50

## 2021-03-05 MED ORDER — TRANEXAMIC ACID-NACL 1000-0.7 MG/100ML-% IV SOLN
1000.0000 mg | INTRAVENOUS | Status: AC
  Administered 2021-03-05: 1000 mg via INTRAVENOUS

## 2021-03-05 MED ORDER — DEXAMETHASONE SODIUM PHOSPHATE 10 MG/ML IJ SOLN
INTRAMUSCULAR | Status: DC | PRN
  Administered 2021-03-05: 10 mg via INTRAVENOUS

## 2021-03-05 MED ORDER — LIDOCAINE 2% (20 MG/ML) 5 ML SYRINGE
INTRAMUSCULAR | Status: DC | PRN
  Administered 2021-03-05: 60 mg via INTRAVENOUS

## 2021-03-05 MED ORDER — CEFAZOLIN SODIUM-DEXTROSE 2-4 GM/100ML-% IV SOLN
INTRAVENOUS | Status: AC
  Filled 2021-03-05: qty 100

## 2021-03-05 MED ORDER — SUGAMMADEX SODIUM 200 MG/2ML IV SOLN
INTRAVENOUS | Status: DC | PRN
Start: 2021-03-05 — End: 2021-03-05
  Administered 2021-03-05: 200 mg via INTRAVENOUS

## 2021-03-05 MED ORDER — DEXAMETHASONE SODIUM PHOSPHATE 10 MG/ML IJ SOLN
INTRAMUSCULAR | Status: AC
  Filled 2021-03-05: qty 1

## 2021-03-05 MED ORDER — PHENYLEPHRINE HCL-NACL 20-0.9 MG/250ML-% IV SOLN
INTRAVENOUS | Status: DC | PRN
  Administered 2021-03-05: 25 ug/min via INTRAVENOUS

## 2021-03-05 MED ORDER — METOCLOPRAMIDE HCL 5 MG PO TABS: 5.0000 mg | ORAL_TABLET | Freq: Three times a day (TID) | ORAL | Status: DC | PRN

## 2021-03-05 MED ORDER — PROPOFOL 10 MG/ML IV BOLUS
INTRAVENOUS | Status: AC
  Filled 2021-03-05: qty 40

## 2021-03-05 MED ORDER — HYDROCODONE-ACETAMINOPHEN 5-325 MG PO TABS
1.0000 | ORAL_TABLET | ORAL | Status: DC | PRN
  Administered 2021-03-06 (×4): 1 via ORAL
  Administered 2021-03-07: 03:00:00 2 via ORAL
  Filled 2021-03-05 (×6): qty 1

## 2021-03-05 MED ORDER — TRANEXAMIC ACID-NACL 1000-0.7 MG/100ML-% IV SOLN
INTRAVENOUS | Status: AC
  Filled 2021-03-05: qty 100

## 2021-03-05 MED ORDER — ACETAMINOPHEN 500 MG PO TABS
500.0000 mg | ORAL_TABLET | Freq: Four times a day (QID) | ORAL | Status: AC
  Administered 2021-03-05 – 2021-03-06 (×4): 500 mg via ORAL
  Filled 2021-03-05 (×4): qty 1

## 2021-03-05 MED ORDER — BUPIVACAINE LIPOSOME 1.3 % IJ SUSP
INTRAMUSCULAR | Status: DC | PRN
  Administered 2021-03-05: 10 mL via PERINEURAL

## 2021-03-05 MED ORDER — MENTHOL 3 MG MT LOZG: 1.0000 | LOZENGE | OROMUCOSAL | Status: DC | PRN

## 2021-03-05 MED ORDER — ONDANSETRON HCL 4 MG/2ML IJ SOLN: 4.0000 mg | Freq: Four times a day (QID) | INTRAMUSCULAR | Status: DC | PRN

## 2021-03-05 MED ORDER — EPINEPHRINE PF 1 MG/ML IJ SOLN: INTRAMUSCULAR | Status: DC | PRN

## 2021-03-05 MED ORDER — AMISULPRIDE (ANTIEMETIC) 5 MG/2ML IV SOLN: 10.0000 mg | Freq: Once | INTRAVENOUS | Status: DC | PRN

## 2021-03-05 MED ORDER — PROPOFOL 10 MG/ML IV BOLUS
INTRAVENOUS | Status: DC | PRN
  Administered 2021-03-05: 130 mg via INTRAVENOUS

## 2021-03-05 MED ORDER — ACETAMINOPHEN 500 MG PO TABS
ORAL_TABLET | ORAL | Status: AC
  Administered 2021-03-05: 1000 mg via ORAL
  Filled 2021-03-05: qty 2

## 2021-03-05 MED ORDER — ONDANSETRON HCL 4 MG PO TABS
4.0000 mg | ORAL_TABLET | Freq: Four times a day (QID) | ORAL | Status: DC | PRN
  Administered 2021-03-06: 4 mg via ORAL
  Filled 2021-03-05: qty 1

## 2021-03-05 MED ORDER — EPINEPHRINE 1 MG/10ML IJ SOSY
PREFILLED_SYRINGE | INTRAMUSCULAR | Status: AC
  Filled 2021-03-05: qty 10

## 2021-03-05 MED ORDER — ROCURONIUM BROMIDE 10 MG/ML (PF) SYRINGE
PREFILLED_SYRINGE | INTRAVENOUS | Status: DC | PRN
Start: 2021-03-05 — End: 2021-03-05
  Administered 2021-03-05: 50 mg via INTRAVENOUS
  Administered 2021-03-05: 30 mg via INTRAVENOUS

## 2021-03-05 MED ORDER — BUPIVACAINE-EPINEPHRINE (PF) 0.5% -1:200000 IJ SOLN
INTRAMUSCULAR | Status: DC | PRN
  Administered 2021-03-05: 12 mL via PERINEURAL

## 2021-03-05 MED ORDER — IBUPROFEN 800 MG PO TABS
800.0000 mg | ORAL_TABLET | Freq: Three times a day (TID) | ORAL | Status: DC
  Administered 2021-03-05 – 2021-03-07 (×5): 800 mg via ORAL
  Filled 2021-03-05 (×6): qty 1
  Filled 2021-03-05: qty 4

## 2021-03-05 MED ORDER — MIDAZOLAM HCL 2 MG/2ML IJ SOLN
INTRAMUSCULAR | Status: AC
  Filled 2021-03-05: qty 2

## 2021-03-05 MED ORDER — ACETAMINOPHEN 500 MG PO TABS: 1000.0000 mg | ORAL_TABLET | Freq: Once | ORAL | Status: AC

## 2021-03-05 MED ORDER — ACETAMINOPHEN 325 MG PO TABS: 325.0000 mg | ORAL_TABLET | Freq: Four times a day (QID) | ORAL | Status: DC | PRN

## 2021-03-05 MED ORDER — CHLORHEXIDINE GLUCONATE 0.12 % MT SOLN: 15.0000 mL | Freq: Once | OROMUCOSAL | Status: AC

## 2021-03-05 MED ORDER — MIDAZOLAM HCL 2 MG/2ML IJ SOLN
INTRAMUSCULAR | Status: DC | PRN
Start: 2021-03-05 — End: 2021-03-05
  Administered 2021-03-05: 2 mg via INTRAVENOUS

## 2021-03-05 MED ORDER — EPHEDRINE 5 MG/ML INJ
INTRAVENOUS | Status: AC
  Filled 2021-03-05: qty 5

## 2021-03-05 MED ORDER — VANCOMYCIN HCL 1000 MG IV SOLR
INTRAVENOUS | Status: AC
  Filled 2021-03-05: qty 20

## 2021-03-05 MED ORDER — ASPIRIN EC 81 MG PO TBEC
81.0000 mg | DELAYED_RELEASE_TABLET | Freq: Every day | ORAL | Status: DC
  Administered 2021-03-05 – 2021-03-07 (×3): 81 mg via ORAL
  Filled 2021-03-05 (×3): qty 1

## 2021-03-05 MED ORDER — ORAL CARE MOUTH RINSE: 15.0000 mL | Freq: Once | OROMUCOSAL | Status: AC

## 2021-03-05 MED ORDER — MORPHINE SULFATE (PF) 2 MG/ML IV SOLN: 0.5000 mg | INTRAVENOUS | Status: DC | PRN

## 2021-03-05 MED ORDER — CEFAZOLIN SODIUM-DEXTROSE 2-4 GM/100ML-% IV SOLN
2.0000 g | INTRAVENOUS | Status: AC
  Administered 2021-03-05: 2 g via INTRAVENOUS

## 2021-03-05 MED ORDER — METOCLOPRAMIDE HCL 5 MG/ML IJ SOLN
5.0000 mg | Freq: Three times a day (TID) | INTRAMUSCULAR | Status: DC | PRN
  Administered 2021-03-06: 10 mg via INTRAVENOUS
  Filled 2021-03-05: qty 2

## 2021-03-05 SURGICAL SUPPLY — 102 items
ALCOHOL 70% 16 OZ (MISCELLANEOUS) ×3 IMPLANT
AUG COMP REV MI TAPER ADAPTER (Joint) ×3 IMPLANT
AUGMENT COMP REV MI TAPR ADPTR (Joint) IMPLANT
BAG COUNTER SPONGE SURGICOUNT (BAG) ×2 IMPLANT
BAG SURGICOUNT SPONGE COUNTING (BAG) ×1
BASEPLATE AUG MED W-TAPER (Plate) ×2 IMPLANT
BEARING HUMERAL SHLDER 36M STD (Shoulder) IMPLANT
BIT DRILL 2.7 W/STOP DISP (BIT) ×1 IMPLANT
BIT DRILL 2.7MM W/STOP DISP (BIT) ×1
BIT DRILL TWIST 2.7 (BIT) ×1 IMPLANT
BIT DRILL TWIST 2.7MM (BIT) ×1
BLADE SAW SGTL 13X75X1.27 (BLADE) ×3 IMPLANT
CHLORAPREP W/TINT 26 (MISCELLANEOUS) ×6 IMPLANT
CLOSURE WOUND 1/2 X4 (GAUZE/BANDAGES/DRESSINGS) ×1
COOLER ICEMAN CLASSIC (MISCELLANEOUS) ×3 IMPLANT
COVER SURGICAL LIGHT HANDLE (MISCELLANEOUS) ×3 IMPLANT
DRAPE INCISE IOBAN 66X45 STRL (DRAPES) ×3 IMPLANT
DRAPE U-SHAPE 47X51 STRL (DRAPES) ×6 IMPLANT
DRESSING AQUACEL AG SP 3.5X10 (GAUZE/BANDAGES/DRESSINGS) IMPLANT
DRSG AQUACEL AG ADV 3.5X10 (GAUZE/BANDAGES/DRESSINGS) ×1 IMPLANT
DRSG AQUACEL AG SP 3.5X10 (GAUZE/BANDAGES/DRESSINGS) ×3
ELECT BLADE 4.0 EZ CLEAN MEGAD (MISCELLANEOUS) ×3
ELECT REM PT RETURN 9FT ADLT (ELECTROSURGICAL) ×3
ELECTRODE BLDE 4.0 EZ CLN MEGD (MISCELLANEOUS) IMPLANT
ELECTRODE REM PT RTRN 9FT ADLT (ELECTROSURGICAL) ×1 IMPLANT
GAUZE SPONGE 4X4 12PLY STRL LF (GAUZE/BANDAGES/DRESSINGS) ×3 IMPLANT
GLENOID SPHERE STD STRL 36MM (Orthopedic Implant) ×2 IMPLANT
GLOVE SRG 8 PF TXTR STRL LF DI (GLOVE) ×1 IMPLANT
GLOVE SURG LTX SZ7 (GLOVE) ×3 IMPLANT
GLOVE SURG LTX SZ8 (GLOVE) ×3 IMPLANT
GLOVE SURG UNDER POLY LF SZ7 (GLOVE) ×3 IMPLANT
GLOVE SURG UNDER POLY LF SZ8 (GLOVE) ×3
GOWN STRL REUS W/ TWL LRG LVL3 (GOWN DISPOSABLE) ×2 IMPLANT
GOWN STRL REUS W/ TWL XL LVL3 (GOWN DISPOSABLE) ×1 IMPLANT
GOWN STRL REUS W/TWL LRG LVL3 (GOWN DISPOSABLE) ×6
GOWN STRL REUS W/TWL XL LVL3 (GOWN DISPOSABLE) ×3
GUIDE GLENOID SHLD MODEL LT (ORTHOPEDIC DISPOSABLE SUPPLIES) ×2 IMPLANT
GUIDE MODEL REV SHLD LT (ORTHOPEDIC DISPOSABLE SUPPLIES) ×2 IMPLANT
HEMOSTAT SURGICEL 2X14 (HEMOSTASIS) IMPLANT
HYDROGEN PEROXIDE 16OZ (MISCELLANEOUS) ×3 IMPLANT
JET LAVAGE IRRISEPT WOUND (IRRIGATION / IRRIGATOR) ×3
KIT BASIN OR (CUSTOM PROCEDURE TRAY) ×3 IMPLANT
KIT TURNOVER KIT B (KITS) ×3 IMPLANT
LAVAGE JET IRRISEPT WOUND (IRRIGATION / IRRIGATOR) ×1 IMPLANT
LOOP VESSEL MAXI BLUE (MISCELLANEOUS) ×1 IMPLANT
MANIFOLD NEPTUNE II (INSTRUMENTS) ×3 IMPLANT
NDL FILTER BLUNT 18X1 1/2 (NEEDLE) IMPLANT
NDL HYPO 25GX1X1/2 BEV (NEEDLE) IMPLANT
NDL SUT 6 .5 CRC .975X.05 MAYO (NEEDLE) ×1 IMPLANT
NEEDLE FILTER BLUNT 18X 1/2SAF (NEEDLE) ×2
NEEDLE FILTER BLUNT 18X1 1/2 (NEEDLE) ×1 IMPLANT
NEEDLE HYPO 25GX1X1/2 BEV (NEEDLE) IMPLANT
NEEDLE MAYO TAPER (NEEDLE) ×3
NOZZLE CEMENT SMALL (ORTHOPEDIC DISPOSABLE SUPPLIES) IMPLANT
NS IRRIG 1000ML POUR BTL (IV SOLUTION) ×3 IMPLANT
PACK SHOULDER (CUSTOM PROCEDURE TRAY) ×3 IMPLANT
PAD ARMBOARD 7.5X6 YLW CONV (MISCELLANEOUS) ×6 IMPLANT
PAD COLD SHLDR WRAP-ON (PAD) ×3 IMPLANT
PATTIES SURGICAL .75X.75 (GAUZE/BANDAGES/DRESSINGS) IMPLANT
PIN STEINMANN THREADED TIP (PIN) ×2 IMPLANT
PIN THREADED REVERSE (PIN) ×4 IMPLANT
REAMER GUIDE AUG PEG 4 LT (INSTRUMENTS) ×2 IMPLANT
REAMER GUIDE BUSHING SURG DISP (MISCELLANEOUS) ×4 IMPLANT
REAMER GUIDE W/SCREW AUG (MISCELLANEOUS) ×4 IMPLANT
RESTRAINT HEAD UNIVERSAL NS (MISCELLANEOUS) ×3 IMPLANT
RETRIEVER SUT HEWSON (MISCELLANEOUS) ×3 IMPLANT
SCREW BONE LOCKING 4.75X40X3.5 (Screw) ×2 IMPLANT
SCREW BONE STRL 6.5MMX30MM (Screw) ×2 IMPLANT
SCREW LOCKING 4.75MMX15MM (Screw) ×4 IMPLANT
SCREW LOCKING STRL 4.75X25X3.5 (Screw) ×2 IMPLANT
SHOULDER HUMERAL BEAR 36M STD (Shoulder) ×3 IMPLANT
SLING ARM IMMOBILIZER LRG (SOFTGOODS) ×3 IMPLANT
SLING ARM IMMOBILIZER MED (SOFTGOODS) ×2 IMPLANT
SOL PREP POV-IOD 4OZ 10% (MISCELLANEOUS) ×3 IMPLANT
SPONGE T-LAP 18X18 ~~LOC~~+RFID (SPONGE) ×5 IMPLANT
STEM HUMERAL STRL 13MMX83MM (Stem) ×2 IMPLANT
STRIP CLOSURE SKIN 1/2X4 (GAUZE/BANDAGES/DRESSINGS) ×2 IMPLANT
SUCTION FRAZIER HANDLE 10FR (MISCELLANEOUS) ×3
SUCTION TUBE FRAZIER 10FR DISP (MISCELLANEOUS) ×1 IMPLANT
SUT BROADBAND TAPE 2PK 1.5 (SUTURE) ×6 IMPLANT
SUT FIBERWIRE #2 38 T-5 BLUE (SUTURE)
SUT MAXBRAID (SUTURE) IMPLANT
SUT MNCRL AB 3-0 PS2 18 (SUTURE) ×3 IMPLANT
SUT SILK 2 0 TIES 10X30 (SUTURE) ×3 IMPLANT
SUT VIC AB 0 CT1 27 (SUTURE) ×6
SUT VIC AB 0 CT1 27XBRD ANBCTR (SUTURE) ×2 IMPLANT
SUT VIC AB 1 CT1 27 (SUTURE) ×3
SUT VIC AB 1 CT1 27XBRD ANBCTR (SUTURE) ×1 IMPLANT
SUT VIC AB 2-0 CT1 27 (SUTURE) ×6
SUT VIC AB 2-0 CT1 TAPERPNT 27 (SUTURE) ×2 IMPLANT
SUT VICRYL 0 UR6 27IN ABS (SUTURE) ×10 IMPLANT
SUTURE FIBERWR #2 38 T-5 BLUE (SUTURE) IMPLANT
SUTURE TAPE 1.3 40 TPR END (SUTURE) IMPLANT
SUTURETAPE 1.3 40 TPR END (SUTURE)
SYR 10ML LL (SYRINGE) ×2 IMPLANT
SYR CONTROL 10ML LL (SYRINGE) IMPLANT
TOWEL GREEN STERILE (TOWEL DISPOSABLE) ×3 IMPLANT
TOWEL GREEN STERILE FF (TOWEL DISPOSABLE) ×3 IMPLANT
TRAY FOL W/BAG SLVR 16FR STRL (SET/KITS/TRAYS/PACK) IMPLANT
TRAY FOLEY W/BAG SLVR 16FR LF (SET/KITS/TRAYS/PACK)
TRAY HUM REV SHOULDER STD +6 (Shoulder) ×2 IMPLANT
WATER STERILE IRR 1000ML POUR (IV SOLUTION) ×1 IMPLANT

## 2021-03-05 NOTE — Op Note (Signed)
NAME: Susan Davila, Susan Davila MEDICAL RECORD NO: [id number] ACCOUNT NO: [id number] DATE OF BIRTH: [birth year] FACILITY: [facility] LOCATION: [facility] PHYSICIAN: Yetta Barre. Marlou Sa, MD  Operative Report   PREOPERATIVE DIAGNOSIS:  Left shoulder arthritis.  POSTOPERATIVE DIAGNOSIS:  Left shoulder arthritis.  PROCEDURE:  Left reverse shoulder replacement using a small augmented glenoid baseplate and the central screw and 4 peripheral locking screws with 36 standard glenosphere on the humeral side, 13 mini humeral stem, standard 36 poly with standard 6 mm  offset humeral tray.  SURGEON: Yetta Barre. Marlou Sa, MD  ASSISTANT:  Annie Main, PA  INDICATIONS:  The patient is a 70 year old patient with left shoulder arthritis, who presents for operative management after explanation of risks and benefits.  PROCEDURE IN DETAIL:  The patient was brought to the operating room where general anesthetic was induced.  Preoperative antibiotics administered.  Timeout was called.  The patient was placed in the beach chair position with the head in neutral position.   Left shoulder was examined and found to have about 30 of external rotation, 75 of isolated glenohumeral abduction and about 95 of forward flexion.  Left shoulder, arm and hand prescrubbed with hydrogen peroxide followed by alcohol and then Betadine,  which was allowed to air dry. Prepped with ChloraPrep solution and draped in sterile manner.  Charlie Pitter was used to cover and seal the operative field.  A timeout was called.  Deltopectoral approach was made.  Skin and subcutaneous tissue were sharply  divided.  Cephalic vein mobilized medially.  Subdeltoid adhesions were released manually.  Deltoid elevated of anterior attachment manually to relieve pressure on the deltoid.  Next, the biceps tendon was tenodesed to the pec tendon.  About the upper 1.5  cm of the pec tendon was released to facilitate exposure.  A tenodesis was performed.  The rotator interval was then opened up to the  base of the coracoid.  Coracohumeral ligaments were released.  Axillary nerve was visualized and palpated and protected  at all times during the case.  The subscapularis was then detached using a 15 blade.  Traction sutures were placed.  Overall, the subscap looked reasonable.  The capsule was then detached off the humeral neck around to the 7 o'clock position.  Cobb  elevator was used to elevate the capsule off the inferior humeral neck about 2 cm distal to the humeral neck bluntly.  Next, the head was dislocated.  The superior rotator cuff appeared intact by palpation and visualization from inside as well as  outside.  The humeral head was then cut in slightly less than its native retroversion by 5-10 degrees.  Proximal reaming was performed up to 13.  The head was then cut in slightly less version than the patient's native version because of posterior aspect  of the arthritis and the posterior subluxation of the humeral head.  Next, the broaching was performed up to size 13, which gave a nice fit.  A cap was then placed and attention was directed towards the glenoid.  The glenoid did have wear.  Posterior  and anterior retractors were placed.  Bankart lesion created from the 12 o'clock down to the 6 o'clock position.  Capsular labral tissue was removed anteriorly.  The posterior tissues were attenuated and were not removed.  Next, guide pin was placed with  use of the patient-specific instrumentation into the glenoid and the base of the coracoid.  Reaming was then performed.  Next, posterior reaming was performed for the glenoid.  After drilling three holes,  the glenoid baseplate was placed with good  contact achieved.  Next, humeral head trials were placed x2.  One matched the humeral head size, the other was slightly bigger.  In both instances even with changing the version of the humeral head to less retroversion and accommodating the patient's  native retroversion with an augmented glenoid, the  patient did have posterior instability with forward flexion.  At this time, it was decided to proceed to reverse replacement.  With the guide pin in position, the reverse glenoid baseplate reamer was  utilized.  Small augment reamer placed at approximately the 2 o'clock position was then utilized.  A good contact was achieved with the trial.  Thorough irrigation performed after the incision both with the IrriSept as well as an IrriSept was also used  after the arthrotomy.  Next, the glenoid baseplate was placed with good compression achieved with a compression screw and 4 peripheral locking screws, which also had very good length to stabilize the glenosphere.  Standard glenosphere was then placed  with about 1.5 to 2 mm offset around the 7 o'clock position on the glenoid.  Reduction was then performed with the +6 offset humeral tray and the standard poly.  This gave a very stable construct with good range of motion and reparability of the  subscapularis.  Trial components were removed on the humeral side. Six suture tapes were placed.  Thorough irrigation into the canal with IrriSept, followed by vancomycin powder was placed.  The humeral stem was then placed with good purchase achieved  with good fixation and press fit achieved.  Next, true components placed and the patient had excellent stability with difficulty in reduction and relocation and dislocation.  Two finger tightness achieved.  The patient was stable with adduction and  extension as well as with forward flexion and abduction.  All in all very stable construct was achieved without undue pressure on any of the conjoined tendon or the axillary nerve.  Thorough irrigation was performed with 3 liters of irrigating solution,  IrriSept solution also utilized.  Subscapularis was then repaired using Nice knots with the suture tapes x6 with a stable repair achieved with the arm externally rotated in 30 degrees.  IrriSept solution then used to irrigate out  the joint.  Vancomycin  powder placed and the rotator interval was closed using #1 Vicryl suture, again with the arm in 30 degrees of external rotation.  Deltopectoral interval was then closed.  Axillary nerve palpated and intact.  Deltopectoral interval closed using #1 Vicryl  suture followed by interrupted inverted 0 Vicryl suture, 2-0 Vicryl suture, 3-0 Monocryl and Steri-Strips.  Aquacel dressing placed along with a shoulder immobilizer.  The patient tolerated the procedure well without immediate complications, transferred  to the recovery room in stable condition.  Luke's assistance was required at all times for mobilization of tissues, retraction, opening, closing.  His assistance was a medical necessity.   NIK D: 03/05/2021 3:34:38 pm T: 03/05/2021 10:13:00 pm  JOB: [id number]/ [id number]

## 2021-03-05 NOTE — Transfer of Care (Signed)
Immediate Anesthesia Transfer of Care Note  Patient: Susan Davila  Procedure(s) Performed: LEFT REVERSE TOTAL SHOULDER ARTHROPLASTY (Left: Shoulder)  Patient Location: PACU  Anesthesia Type:General and Regional  Level of Consciousness: awake and alert   Airway & Oxygen Therapy: Patient Spontanous Breathing  Post-op Assessment: Report given to RN and Post -op Vital signs reviewed and stable  Post vital signs: Reviewed and stable  Last Vitals:  Vitals Value Taken Time  BP 123/75 03/05/21 1143  Temp    Pulse 91 03/05/21 1144  Resp 14 03/05/21 1144  SpO2 98 % 03/05/21 1144  Vitals shown include unvalidated device data.  Last Pain:  Vitals:   03/05/21 0600  TempSrc: Oral  PainSc: 3       Patients Stated Pain Goal: 1 (35/36/14 4315)  Complications: No notable events documented.

## 2021-03-05 NOTE — Anesthesia Postprocedure Evaluation (Signed)
Anesthesia Post Note  Patient: MODELLE VOLLMER  Procedure(s) Performed: LEFT REVERSE TOTAL SHOULDER ARTHROPLASTY (Left: Shoulder)     Patient location during evaluation: PACU Anesthesia Type: Regional and General Level of consciousness: awake and alert, oriented and patient cooperative Pain management: pain level controlled Vital Signs Assessment: post-procedure vital signs reviewed and stable Respiratory status: spontaneous breathing, nonlabored ventilation and respiratory function stable Cardiovascular status: blood pressure returned to baseline and stable Postop Assessment: no apparent nausea or vomiting Anesthetic complications: no   No notable events documented.  Last Vitals:  Vitals:   03/05/21 1314 03/05/21 1344  BP: 105/70 105/64  Pulse: 70 68  Resp: 15 13  Temp:    SpO2: 95% 96%    Last Pain:  Vitals:   03/05/21 1314  TempSrc:   PainSc: 0-No pain                 Pervis Hocking

## 2021-03-05 NOTE — Anesthesia Procedure Notes (Signed)
Procedure Name: Intubation Date/Time: 03/05/2021 7:43 AM Performed by: Ardyth Harps, CRNA Pre-anesthesia Checklist: Patient identified, Emergency Drugs available, Suction available and Patient being monitored Patient Re-evaluated:Patient Re-evaluated prior to induction Oxygen Delivery Method: Circle System Utilized Preoxygenation: Pre-oxygenation with 100% oxygen Induction Type: IV induction Ventilation: Mask ventilation without difficulty Laryngoscope Size: Mac and 3 Grade View: Grade I Tube type: Oral Tube size: 7.0 mm Number of attempts: 1 Airway Equipment and Method: Stylet Placement Confirmation: ETT inserted through vocal cords under direct vision, positive ETCO2 and breath sounds checked- equal and bilateral Secured at: 21 cm Tube secured with: Tape Dental Injury: Teeth and Oropharynx as per pre-operative assessment

## 2021-03-05 NOTE — Anesthesia Procedure Notes (Addendum)
Anesthesia Regional Block: Interscalene brachial plexus block   Pre-Anesthetic Checklist: , timeout performed,  Correct Patient, Correct Site, Correct Laterality,  Correct Procedure, Correct Position, site marked,  Risks and benefits discussed,  Surgical consent,  Pre-op evaluation,  At surgeon's request and post-op pain management  Laterality: Left  Prep: Maximum Sterile Barrier Precautions used, chloraprep       Needles:  Injection technique: Single-shot  Needle Type: Echogenic Stimulator Needle     Needle Length: 9cm  Needle Gauge: 22     Additional Needles:   Procedures:,,,, ultrasound used (permanent image in chart),,    Narrative:  Start time: 03/05/2021 7:05 AM End time: 03/05/2021 7:15 AM Injection made incrementally with aspirations every 5 mL.  Performed by: Personally  Anesthesiologist: Pervis Hocking, DO  Additional Notes: Monitors applied. No increased pain on injection. No increased resistance to injection. Injection made in 5cc increments. Good needle visualization. Patient tolerated procedure well.

## 2021-03-05 NOTE — Plan of Care (Signed)

## 2021-03-05 NOTE — Progress Notes (Signed)
Pt seen in room from PACU s/p left reverse total shoulder arthroplasty,aquacel dressing CDI, alert/oriented in no apparent distress. Orientated to room/equipments, menu provided with instructions. Hospital valuables policy has been discussed with no complaints. Hospital bed in lowest position with 3 side rails up, call bell/room phone within reach and all wheels locked.

## 2021-03-05 NOTE — Progress Notes (Signed)
Report received and care assumed from previous shift RN. VS obtained, shift assessments completed - see flowsheets. LUE sling intact, dressing to L shoulder CDI. Turns and repositions self in bed. By end of shift, patient able to move LUE on command, c/o continued decreased sensation, but able to feel when this RN touching fingers and hand. Patient currently resting in bed, bed in lowest position. Denies needs. Call bell within reach.

## 2021-03-05 NOTE — H&P (Signed)
Susan Davila is an 70 y.o. female.   Chief Complaint: Left shoulder pain HPI: Susan Davila is a 70 year old patient with left shoulder pain.  She has had a CT scan of the shoulder which shows mild biconcave glenoid with some posterior subluxation of the humeral head.  MRI scan shows intact rotator cuff.  Significant arthritis is present.  Patient works as a Research scientist (physical sciences) and she is very happy working and would like to return to work as soon as possible after surgery.  She has taken ibuprofen for her symptoms.  No cardiac or diabetic disease.  She reports no neck pain but does report a lot of pain at night which wakes her up frequently.  She describes pain with activities of daily living  Past Medical History:  Diagnosis Date   Thyroid disease     Past Surgical History:  Procedure Laterality Date   BARTHOLIN CYST MARSUPIALIZATION     EYE SURGERY     LIPOSUCTION     wisdom teeth Bilateral     Family History  Problem Relation Age of Onset   Dementia Mother 77   Alcohol abuse Father 17   Social History:  reports that she quit smoking about 42 years ago. Her smoking use included cigarettes. She has never used smokeless tobacco. She reports current alcohol use of about 20.0 standard drinks per week. She reports that she does not use drugs.  Allergies: No Known Allergies  Medications Prior to Admission  Medication Sig Dispense Refill   FOLIC ACID PO Take 1 capsule by mouth daily.     ibuprofen (ADVIL) 200 MG tablet Take 600-800 mg by mouth 2 (two) times daily as needed for moderate pain.     levothyroxine (SYNTHROID) 25 MCG tablet Take 1 tablet (25 mcg total) by mouth daily before breakfast. 90 tablet 1   vitamin B-12 (CYANOCOBALAMIN) 1000 MCG tablet Take 1,000 mcg by mouth daily.      No results found for this or any previous visit (from the past 48 hour(s)). No results found.  Review of Systems  Musculoskeletal:  Positive for arthralgias.  All other systems reviewed and are  negative.  Blood pressure 125/77, pulse 71, temperature 98 F (36.7 C), temperature source Oral, resp. rate 18, height 5\' 8"  (1.727 m), weight 69.4 kg, SpO2 96 %. Physical Exam Vitals reviewed.  HENT:     Head: Normocephalic.     Nose: Nose normal.     Mouth/Throat:     Mouth: Mucous membranes are moist.  Eyes:     Pupils: Pupils are equal, round, and reactive to light.  Cardiovascular:     Rate and Rhythm: Normal rate.     Pulses: Normal pulses.  Pulmonary:     Effort: Pulmonary effort is normal.  Abdominal:     General: Abdomen is flat.  Musculoskeletal:     Cervical back: Normal range of motion.  Skin:    General: Skin is warm.  Neurological:     General: No focal deficit present.     Mental Status: She is alert.  Psychiatric:        Mood and Affect: Mood normal.   Ortho exam demonstrates painful range of motion with the left shoulder.  Passive range of motion is 25/45/130.  Rotator cuff strength is good infraspinatus supraspinatus and subscap muscle testing.  No AC joint tenderness is present.  Deltoid is functional.  Motor or sensory function to the hand is intact  Assessment/Plan Impression is left shoulder arthritis.  Plan  is left shoulder replacement.  Preoperative planning demonstrates that she will require augmented glenoid for total shoulder replacement.  Rotator cuff in tact..  Rotator cuff strength is good but her range of motion is slightly limited.  Risk and benefits of surgical intervention are discussed including not limited to infection nerve vessel damage instability incomplete pain relief as well as possibility of conversion to reverse replacement if rotator cuff failure recurs in the future.  Patient understands risk and benefits and wishes to proceed with surgery.  All questions answered  Anderson Malta, MD 03/05/2021, 7:22 AM

## 2021-03-05 NOTE — Brief Op Note (Signed)
   03/05/2021  3:25 PM  PATIENT:  Susan Davila  70 y.o. female  PRE-OPERATIVE DIAGNOSIS:  left shoulder osteoarthritis  POST-OPERATIVE DIAGNOSIS:  left shoulder osteoarthritis  PROCEDURE:  Procedure(s): LEFT REVERSE TOTAL SHOULDER ARTHROPLASTY  SURGEON:  Surgeon(s): Meredith Pel, MD  ASSISTANT: magnant pa  ANESTHESIA:   general  EBL: 75 ml    Total I/O In: 1100 [I.V.:1100] Out: 900 [Urine:750; Blood:150]  BLOOD ADMINISTERED: none  DRAINS: none   LOCAL MEDICATIONS USED:  vanco  SPECIMEN:  No Specimen  COUNTS:  YES  TOURNIQUET:  * No tourniquets in log *  DICTATION: .Other Dictation: Dictation Number [id number]  PLAN OF CARE: Admit for overnight observation  PATIENT DISPOSITION:  PACU - hemodynamically stable

## 2021-03-06 ENCOUNTER — Encounter (HOSPITAL_COMMUNITY): Payer: Self-pay | Admitting: Orthopedic Surgery

## 2021-03-06 DIAGNOSIS — M19012 Primary osteoarthritis, left shoulder: Secondary | ICD-10-CM | POA: Diagnosis not present

## 2021-03-06 LAB — BASIC METABOLIC PANEL
Anion gap: 7 (ref 5–15)
BUN: 16 mg/dL (ref 8–23)
CO2: 28 mmol/L (ref 22–32)
Calcium: 8.5 mg/dL — ABNORMAL LOW (ref 8.9–10.3)
Chloride: 102 mmol/L (ref 98–111)
Creatinine, Ser: 0.74 mg/dL (ref 0.44–1.00)
GFR, Estimated: >60 mL/min (ref >60)
Glucose, Bld: 137 mg/dL — ABNORMAL HIGH (ref 70–99)
Potassium: 3.8 mmol/L (ref 3.5–5.1)
Sodium: 137 mmol/L (ref 135–145)

## 2021-03-06 LAB — CBC
HCT: 34.2 % — ABNORMAL LOW (ref 36.0–46.0)
Hemoglobin: 11.6 g/dL — ABNORMAL LOW (ref 12.0–15.0)
MCH: 31.6 pg (ref 26.0–34.0)
MCHC: 33.9 g/dL (ref 30.0–36.0)
MCV: 93.2 fL (ref 80.0–100.0)
Platelets: 180 10*3/uL (ref 150–400)
RBC: 3.67 MIL/uL — ABNORMAL LOW (ref 3.87–5.11)
RDW: 12.2 % (ref 11.5–15.5)
WBC: 5.9 10*3/uL (ref 4.0–10.5)
nRBC: 0 % (ref 0.0–0.2)

## 2021-03-06 MED ORDER — DOCUSATE SODIUM 100 MG PO CAPS: 100.0000 mg | ORAL_CAPSULE | Freq: Two times a day (BID) | ORAL | 0 refills | Status: DC

## 2021-03-06 MED ORDER — SODIUM CHLORIDE 0.9 % IV BOLUS
500.0000 mL | Freq: Once | INTRAVENOUS | Status: AC
  Administered 2021-03-06: 500 mL via INTRAVENOUS

## 2021-03-06 MED ORDER — METHOCARBAMOL 500 MG PO TABS: 500.0000 mg | ORAL_TABLET | Freq: Three times a day (TID) | ORAL | 0 refills | Status: DC | PRN

## 2021-03-06 MED ORDER — HYDROCODONE-ACETAMINOPHEN 5-325 MG PO TABS: 1.0000 | ORAL_TABLET | ORAL | 0 refills | Status: DC | PRN

## 2021-03-06 MED ORDER — ASPIRIN 81 MG PO TBEC: 81.0000 mg | DELAYED_RELEASE_TABLET | Freq: Every day | ORAL | 11 refills | Status: DC

## 2021-03-06 NOTE — Progress Notes (Signed)
Pt worked with physical therapy earlier today at [phone]. Shortly after, 5-10 minutes pt complained of nausea and dizziness. BP 87/53. Medications were given as ordered prn for nausea. MD Salvadore Farber and Gloriann Loan PA notified. Pt continued to have some dizziness and nausea. PA agreed to have pt stay overnight as she continued to have BP issues. BP more stable at this time as listed in flowsheets.

## 2021-03-06 NOTE — Progress Notes (Signed)
Physical Therapy Discharge Patient Details Name: Susan Davila MRN: [id number] DOB: 12/23/1950 Today's Date: 03/06/2021 Time: 0852-0920 PT Time Calculation (min) (ACUTE ONLY): 28 min  Patient discharged from PT services secondary to goals met and no further PT needs identified.  Please see latest therapy progress note for current level of functioning and progress toward goals.    Progress and discharge plan discussed with patient and/or caregiver: Patient/Caregiver agrees with plan  GP    [name] A. Chevis Weisensel, PT, DPT Acute Rehabilitation Services Office: [place] 03/06/2021, 9:43 AM

## 2021-03-06 NOTE — Evaluation (Signed)
Physical Therapy Evaluation and D/C Patient Details Name: Susan Davila MRN: [id number] DOB: Jan 26, 1951 Today's Date: 03/06/2021  History of Present Illness  Pt. is 70 yr old F admitted on 03/05/21 for planned L reverse TSA. PMH: unremarkable  Clinical Impression  Pt was previously independent with all ADLs and gait.  S/p reverse TSA, pt is still able to complete functional mobility independently.  No further skilled PT required in acute care.  Can continue to mobilize with mobility team during hospital admission.  Of note, after PT treatment, pt calls for help and c/o feeling hot and like she is going to pass out.  [facility] is notified and BP is taken - noted to be 87/53.  Pt will be safe to return home at D/C with assist from spouse.     Recommendations for follow up therapy are one component of a multi-disciplinary discharge planning process, led by the attending physician.  Recommendations may be updated based on patient status, additional functional criteria and insurance authorization.  Follow Up Recommendations No PT follow up    Assistance Recommended at Discharge PRN  Functional Status Assessment Patient has had a recent decline in their functional status and demonstrates the ability to make significant improvements in function in a reasonable and predictable amount of time.  Equipment Recommendations       Recommendations for Other Services       Precautions / Restrictions Precautions Precautions: Shoulder Type of Shoulder Precautions: Reverse TSA Shoulder Interventions: Shoulder sling/immobilizer Restrictions Weight Bearing Restrictions: Yes LUE Weight Bearing: Non weight bearing      Mobility  Bed Mobility Overal bed mobility: Independent             General bed mobility comments: Able to transfer independently to EOB with use of R UE to help scoot pelvis forward. Patient Response: Cooperative  Transfers Overall transfer level: Independent                  General transfer comment: Discussed safety with use of R UE to reach back to chair for eccentric control.    Ambulation/Gait Ambulation/Gait assistance: Independent Gait Distance (Feet): 1200 Feet Assistive device: None         General Gait Details: Pt. amb x 2 laps around [facility] floor without difficulty.  Stairs            Wheelchair Mobility    Modified Rankin (Stroke Patients Only)       Balance Overall balance assessment: Independent                                           Pertinent Vitals/Pain Pain Assessment: 0-10 Pain Score: 5  Pain Location: L shoulder Pain Descriptors / Indicators: Aching;Discomfort Pain Intervention(s): Limited activity within patient's tolerance;Monitored during session    Home Living Family/patient expects to be discharged to:: Private residence Living Arrangements: Spouse/significant other Available Help at Discharge: Family Type of Home: House Home Access: Stairs to enter   Technical brewer of Steps: 1 step at back entrance   Home Layout: One level Home Equipment: None      Prior Function Prior Level of Function : Independent/Modified Independent             Mobility Comments: 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 is supine in bed when PT arrives, sleeping but arouses easily.  C/o increased   L shoulder pain, states she took pain medicine but it is uncontrolled.  PT encourages pt. to discuss if medication can be adjusted.        General Comments General comments (skin integrity, edema, etc.): Pt upset about not being able to return to gym right away and re: being limited in how much she can lift for extended period of time.  PT takes time to discuss with pt that when cleared to weightbear, she can always opt to do more reps with less weight to still get an adequate work out.  Demos understanding.    Exercises     Assessment/Plan    PT Assessment Patient does not need any further PT services  PT Problem List         PT Treatment Interventions      PT Goals (Current goals can be found in the Care Plan section)  Acute Rehab PT Goals Patient Stated Goal: Pt's goal is to return to working out at the gym PT Goal Formulation: With patient Time For Goal Achievement: 03/20/21 Potential to Achieve Goals: Good    Frequency     Barriers to discharge        Co-evaluation               AM-PAC PT "6 Clicks" Mobility  Outcome Measure Help needed turning from your back to your side while in a flat bed without using bedrails?: None Help needed moving from lying on your back to sitting on the side of a flat bed without using bedrails?: None Help needed moving to and from a bed to a chair (including a wheelchair)?: A Little Help needed standing up from a chair using your arms (e.g., wheelchair or bedside chair)?: None Help needed to walk in hospital room?: None Help needed climbing 3-5 steps with a railing? : A Little 6 Click Score: 22    End of Session Equipment Utilized During Treatment: Gait belt Activity Tolerance: Patient tolerated treatment well;No increased pain Patient left: in chair;with call bell/phone within reach   PT Visit Diagnosis: Other abnormalities of gait and mobility (R26.89)    Time: 1914-7829 PT Time Calculation (min) (ACUTE ONLY): 28  min   Charges:   PT Evaluation $PT Eval Low Complexity: 1 Low PT Treatments $Therapeutic Activity: 8-22 mins        Venus Ruhe A. Orris Perin, PT, DPT Acute Rehabilitation Services Office: [place] 03/06/2021, 9:40 AM

## 2021-03-06 NOTE — Progress Notes (Signed)
  Subjective: Susan Davila is a 70 y.o. female s/p left RSA.  They are POD 1.  Pt's pain is controlled.  Block is wearing off at this point.  She has sensation that is returned to her hand and wrist.  Denies any complaints aside from some early pain in her axilla at this point..    Objective: Vital signs in last 24 hours: Temp:  [97.1 F (36.2 C)-98.4 F (36.9 C)] 98.4 F (36.9 C) (11/16 0700) Pulse Rate:  [66-91] 66 (11/16 0700) Resp:  [10-19] 18 (11/16 0700) BP: (103-123)/(57-75) 119/68 (11/16 0700) SpO2:  [95 %-99 %] 97 % (11/16 0700)  Intake/Output from previous day: 11/15 0701 - 11/16 0700 In: 1870 [P.O.:120; I.V.:1700; IV Piggyback:50] Out: 2300 [Urine:2150; Blood:150] Intake/Output this shift: No intake/output data recorded.  Exam:  No gross blood or drainage overlying the dressing 2+ radial pulse Sensation intact distally in the left hand Able to extend the left wrist.  Intact EPL, FPL, finger abduction, finger adduction Axillary nerve intact with deltoid firing.  Intact bicep and tricep.   Labs: Recent Labs    03/06/21 0630  HGB 11.6*   Recent Labs    03/06/21 0630  WBC 5.9  RBC 3.67*  HCT 34.2*  PLT 180   Recent Labs    03/06/21 0630  NA 137  K 3.8  CL 102  CO2 28  BUN 16  CREATININE 0.74  GLUCOSE 137*  CALCIUM 8.5*   No results for input(s): LABPT, INR in the last 72 hours.  Assessment/Plan: Pt is POD 1 s/p left RSA    -Plan to discharge to home today or tomorrow pending patient's pain  -No lifting with the operative arm  -Her pain is fairly well controlled and she would like to be discharged as soon as possible.  Plan to put discharge orders in and then she is okay to go as long as there are no complications with her performing occupational and physical therapy this morning.     Susan Davila L Susan Davila 03/06/2021, 8:17 AM

## 2021-03-06 NOTE — Evaluation (Signed)
Occupational Therapy Evaluation Patient Details Name: Susan Davila MRN: [id number] DOB: Dec 07, 1950 Today's Date: 03/06/2021   History of Present Illness Pt. is 70 yr old F admitted on 03/05/21 for planned L reverse TSA. PMH: unremarkable   Clinical Impression   Pt independent with ADLs and functional mobility at baseline, spouse at bedside present throughout session. Pt min A for standing and seated ADLs, requires mod-max A for dressing at this time due to pain and dizziness. Educated pt and spouse on HEP for elbow/wrist/hand exercises able to demonstrate and verbalize understanding. Pt educated on sling wear schedule, sleep positioning, and compensatory strategies for showering. Pt dizzy throughout session, BP 97/56 after sitting upright in chair for session, pt reclined and nurse notified of pt's pain med request. Pt and spouse verbalize and demonstrate understanding. Pt limited by pain, activity tolerance, and ROM at this time, will continue to follow acutely to maximize safety with ADLs. Pt to benefit from d/c home with spouse.     Recommendations for follow up therapy are one component of a multi-disciplinary discharge planning process, led by the attending physician.  Recommendations may be updated based on patient status, additional functional criteria and insurance authorization.   Follow Up Recommendations  Follow physician's recommendations for discharge plan and follow up therapies    Assistance Recommended at Discharge Intermittent Supervision/Assistance  Functional Status Assessment  Patient has had a recent decline in their functional status and demonstrates the ability to make significant improvements in function in a reasonable and predictable amount of time.  Equipment Recommendations  Tub/shower seat    Recommendations for Other Services PT consult     Precautions / Restrictions Precautions Precautions: Shoulder Type of Shoulder Precautions: Reverse TSA Shoulder  Interventions: Shoulder sling/immobilizer;Off for dressing/bathing/exercises Precaution Booklet Issued: Yes (comment) Precaution Comments: issued ADL and hand/wrist/elbow exercises Required Braces or Orthoses: Sling Restrictions Weight Bearing Restrictions: Yes LUE Weight Bearing: Non weight bearing      Mobility Bed Mobility Overal bed mobility: Independent             General bed mobility comments: Able to transfer independently to EOB with use of R UE to help scoot pelvis forward.    Transfers Overall transfer level: Independent                 General transfer comment: Discussed safety with use of R UE to reach back to chair for eccentric control.      Balance Overall balance assessment: Independent                                         ADL either performed or assessed with clinical judgement   ADL Overall ADL's : Needs assistance/impaired Eating/Feeding: Set up;Sitting   Grooming: Set up;Sitting   Upper Body Bathing: Minimal assistance;Sitting Upper Body Bathing Details (indicate cue type and reason): educated pt on using leg to support arm in shower to bathe under arms Lower Body Bathing: Minimal assistance;Sitting/lateral leans   Upper Body Dressing : Maximal assistance;Sitting Upper Body Dressing Details (indicate cue type and reason): increased pain, had to swtich from donning crew neck shirt to button up shirt, requires assistance to get past elbow and around back Lower Body Dressing: Moderate assistance;Sit to/from stand;Minimal assistance   Toilet Transfer: Minimal assistance;Regular Museum/gallery exhibitions officer and Hygiene: Sit to/from stand;Moderate assistance;Minimal assistance  Vision   Vision Assessment?: No apparent visual deficits     Perception     Praxis      Pertinent Vitals/Pain Pain Assessment: Faces Pain Score: 5  Faces Pain Scale: Hurts even more Pain Location: L  shoulder Pain Descriptors / Indicators: Constant;Aching;Discomfort Pain Intervention(s): Limited activity within patient's tolerance;Monitored during session;Relaxation;Patient requesting pain meds-RN notified     Hand Dominance     Extremity/Trunk Assessment Upper Extremity Assessment Upper Extremity Assessment: LUE deficits/detail LUE: Unable to fully assess due to pain;Shoulder pain with ROM;Shoulder pain at rest   Lower Extremity Assessment Lower Extremity Assessment: Defer to PT evaluation   Cervical / Trunk Assessment Cervical / Trunk Assessment: Normal   Communication Communication Communication: No difficulties   Cognition Arousal/Alertness: Awake/alert Behavior During Therapy: WFL for tasks assessed/performed Overall Cognitive Status: Within Functional Limits for tasks assessed                                 General Comments: Pt. is supine in bed when PT arrives, sleeping but arouses easily.  C/o increased  L shoulder pain, states she took pain medicine but it is uncontrolled.  PT encourages pt. to discuss if medication can be adjusted.     General Comments  pt reporting increased dizziness after provided education on exercises and ADL task, reclined pt in chair, BP 97/56    Exercises Exercises: Shoulder Shoulder Exercises Elbow Flexion: AROM;Self ROM;10 reps;Seated;Left Elbow Extension: Left;AROM;Self ROM;10 reps;Seated Wrist Flexion: AROM;Left;10 reps;Seated Wrist Extension: AROM;Self ROM;Left;Seated Digit Composite Flexion: AROM;Self ROM;10 reps;Seated Composite Extension: AROM;Left;10 reps;Seated   Shoulder Instructions Shoulder Instructions Donning/doffing shirt without moving shoulder: Maximal assistance Method for sponge bathing under operated UE: Patient able to independently direct caregiver;Moderate assistance Donning/doffing sling/immobilizer: Moderate assistance;Patient able to independently direct caregiver Correct positioning of  sling/immobilizer: Moderate assistance;Patient able to independently direct caregiver ROM for elbow, wrist and digits of operated UE: Supervision/safety Sling wearing schedule (on at all times/off for ADL's): Supervision/safety Proper positioning of operated UE when showering: Supervision/safety Positioning of UE while sleeping: [place] expects to be discharged to:: Private residence Living Arrangements: Spouse/significant other Available Help at Discharge: Family Type of Home: House Home Access: Stairs to enter Technical brewer of Steps: 1 step at back entrance   [place]: One level               Home Equipment: None   Additional Comments: pt/spouse state they are able to get shower chair      Prior Functioning/Environment Prior Level of Function : Independent/Modified Independent             Mobility 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          OT Problem List: Decreased strength;Decreased range of motion;Decreased activity tolerance;Pain;Impaired UE functional use      OT Treatment/Interventions: Self-care/ADL training;Therapeutic exercise;Patient/family education    OT Goals(Current goals can be found in the care plan section) Acute Rehab OT Goals Patient Stated Goal: return to PLOF OT Goal Formulation: With patient Time For Goal Achievement: 03/20/21 Potential to Achieve Goals: Good  OT Frequency: Min 2X/week   Barriers to D/C:            Co-evaluation              AM-PAC OT "6 Clicks" Daily Activity     Outcome Measure Help from another person  eating meals?: None Help from another person taking care of personal grooming?: A Little Help from another person toileting, which includes using toliet, bedpan, or urinal?: A Little Help from another person bathing (including washing, rinsing, drying)?: A Little Help from another person to put on and taking off regular  upper body clothing?: A Lot Help from another person to put on and taking off regular lower body clothing?: A Little 6 Click Score: 18   End of Session Nurse Communication: Patient requests pain meds;Mobility status  Activity Tolerance: Patient limited by fatigue Patient left: in chair;with chair alarm set;with call bell/phone within reach;with family/visitor present;with nursing/sitter in room  OT Visit Diagnosis: Unsteadiness on feet (R26.81);Muscle weakness (generalized) (M62.81);Dizziness and giddiness (R42);Pain                Time: 7121-9758 OT Time Calculation (min): 30 min Charges:  OT General Charges $OT Visit: 1 Visit OT Treatments $Therapeutic Activity: 8-22 mins  Lynnda Child, OTD, OTR/L Acute Rehab [phone]) [phone] - [place] 03/06/2021, 11:06 AM

## 2021-03-07 ENCOUNTER — Telehealth: Payer: Self-pay | Admitting: Orthopedic Surgery

## 2021-03-07 DIAGNOSIS — M19012 Primary osteoarthritis, left shoulder: Secondary | ICD-10-CM | POA: Diagnosis not present

## 2021-03-07 LAB — CBC
HCT: 34.3 % — ABNORMAL LOW (ref 36.0–46.0)
Hemoglobin: 11.6 g/dL — ABNORMAL LOW (ref 12.0–15.0)
MCH: 32 pg (ref 26.0–34.0)
MCHC: 33.8 g/dL (ref 30.0–36.0)
MCV: 94.8 fL (ref 80.0–100.0)
Platelets: 159 10*3/uL (ref 150–400)
RBC: 3.62 MIL/uL — ABNORMAL LOW (ref 3.87–5.11)
RDW: 12.3 % (ref 11.5–15.5)
WBC: 5.2 10*3/uL (ref 4.0–10.5)
nRBC: 0 % (ref 0.0–0.2)

## 2021-03-07 NOTE — Telephone Encounter (Signed)
Pt's husband Jimmye Norman called requesting a call back right away concerning a script for CPM chair. They are asking for expedited script to be sent. They state they will pay out of pocket and their insurance company states they didn't get a request. Please call as soon as possible [phone].

## 2021-03-07 NOTE — Progress Notes (Signed)
  Mobility Specialist Criteria Algorithm Info.    03/07/21 1100  Mobility  Activity Ambulated in hall;Ambulated to bathroom;Dangled on edge of bed  Range of Motion/Exercises Active;All extremities  Level of Assistance Modified independent, requires aide device or extra time  Assistive Device Other (Comment) (IV Pole)  LUE Weight Bearing NWB  Distance Ambulated (ft) 1360 ft  Mobility Ambulated with assistance in hallway  Mobility Response Tolerated well  Mobility performed by Mobility specialist  Bed Position Semi-fowlers      Patient received lying in bed, eager to participate in mobility with anticipation to be discharged. Ambulated to bathroom then in hallway independently using IV pole for stability. Returned to room without complaint or incident, was left dangling EOB with all needs met and OT present.  03/07/2021 11:14 AM

## 2021-03-07 NOTE — Progress Notes (Signed)
Occupational Therapy Treatment Patient Details Name: Susan Davila MRN: [id number] DOB: 1951-02-25 Today's Date: 03/07/2021   History of present illness Pt. is 70 yr old F admitted on 03/05/21 for planned L reverse TSA. PMH: unremarkable   OT comments  Pt walking with mobility tech upon arrival, independent with transfers and bed mobility. Pt demonstrates UE hand/wrist/elbow exercises with minimal pain this session, able to don/doff brace as well as UE clothing with min-mod A. Reviewed precautions and NWB status with pt, pt verbalized and demonstrated understanding. Pt reported no dizziness/nausea during session, pain is minimal. Pt still presenting with impaired ROM and UE coordination for dressing, however is able to instruct caregiver on proper positioning and techniques. Will continue to follow acutely, follow physician's recommendations for follow up therapy.   Recommendations for follow up therapy are one component of a multi-disciplinary discharge planning process, led by the attending physician.  Recommendations may be updated based on patient status, additional functional criteria and insurance authorization.    Follow Up Recommendations  Follow physician's recommendations for discharge plan and follow up therapies    Assistance Recommended at Discharge Intermittent Supervision/Assistance  Equipment Recommendations  Tub/shower seat    Recommendations for Other Services PT consult    Precautions / Restrictions Precautions Precautions: Shoulder Type of Shoulder Precautions: Reverse TSA Shoulder Interventions: Shoulder sling/immobilizer;Off for dressing/bathing/exercises Precaution Booklet Issued: Yes (comment) Required Braces or Orthoses: Sling Restrictions Weight Bearing Restrictions: Yes LUE Weight Bearing: Non weight bearing       Mobility Bed Mobility Overal bed mobility: Independent                  Transfers Overall transfer level: Independent                        Balance Overall balance assessment: No apparent balance deficits (not formally assessed)                                         ADL either performed or assessed with clinical judgement   ADL Overall ADL's : Needs assistance/impaired                 Upper Body Dressing : Sitting;Minimal assistance Upper Body Dressing Details (indicate cue type and reason): dons/doffs sling and UE clothing     Toilet Transfer: Supervision/safety;Regular Toilet           Functional mobility during ADLs: Supervision/safety      Extremity/Trunk Assessment Upper Extremity Assessment Upper Extremity Assessment: LUE deficits/detail LUE: Unable to fully assess due to immobilization   Lower Extremity Assessment Lower Extremity Assessment: Defer to PT evaluation        Vision   Vision Assessment?: No apparent visual deficits   Perception     Praxis      Cognition Arousal/Alertness: Awake/alert Behavior During Therapy: WFL for tasks assessed/performed Overall Cognitive Status: Within Functional Limits for tasks assessed                                 General Comments: Pt up walking laps with mobility tech upon arrival          Exercises Exercises: Shoulder Shoulder Exercises Elbow Flexion: AROM;Self ROM;10 reps;Seated;Left Elbow Extension: Left;AROM;Self ROM;10 reps;Seated Wrist Flexion: AROM;Left;10 reps;Seated Wrist Extension: AROM;Self ROM;Left;Seated Digit Composite Flexion: AROM;Self ROM;10 reps;Seated  Composite Extension: AROM;Left;10 reps;Seated   Shoulder Instructions Shoulder Instructions Donning/doffing shirt without moving shoulder: Minimal assistance Donning/doffing sling/immobilizer: Moderate assistance;Patient able to independently direct caregiver;Minimal assistance Correct positioning of sling/immobilizer: Moderate assistance;Patient able to independently direct caregiver;Minimal assistance ROM for elbow,  wrist and digits of operated UE: Supervision/safety     General Comments BP stable during session, pt reported no dizziness, nausea    Pertinent Vitals/ Pain       Pain Assessment: Faces Pain Score: 3  Faces Pain Scale: Hurts a little bit Pain Location: L shoulder Pain Descriptors / Indicators: Constant;Aching;Discomfort Pain Intervention(s): Limited activity within patient's tolerance;Monitored during session;Repositioned  Home Living                                          Prior Functioning/Environment              Frequency  Min 2X/week        Progress Toward Goals  OT Goals(current goals can now be found in the care plan section)  Progress towards OT goals: Progressing toward goals  Acute Rehab OT Goals Patient Stated Goal: return to PLOF OT Goal Formulation: With patient Time For Goal Achievement: 03/20/21 Potential to Achieve Goals: Good  Plan Discharge plan remains appropriate;Frequency remains appropriate    Co-evaluation                 AM-PAC OT "6 Clicks" Daily Activity     Outcome Measure   Help from another person eating meals?: None Help from another person taking care of personal grooming?: None Help from another person toileting, which includes using toliet, bedpan, or urinal?: A Little Help from another person bathing (including washing, rinsing, drying)?: A Little Help from another person to put on and taking off regular upper body clothing?: A Little Help from another person to put on and taking off regular lower body clothing?: None 6 Click Score: 21    End of Session    OT Visit Diagnosis: Unsteadiness on feet (R26.81);Muscle weakness (generalized) (M62.81);Dizziness and giddiness (R42);Pain   Activity Tolerance Patient tolerated treatment well   Patient Left in bed;with bed alarm set   Nurse Communication Mobility status;Other (comment) (saline lock pt)        Time: 1030-1051 OT Time Calculation  (min): 21 min  Charges: OT General Charges $OT Visit: 1 Visit OT Treatments $Self Care/Home Management : 8-22 mins  Lynnda Child, OTD, OTR/L Acute Rehab [phone]) [phone] - [place] 03/07/2021, 11:05 AM

## 2021-03-07 NOTE — Telephone Encounter (Signed)
Per Ovid Curd this has been taken care of

## 2021-03-07 NOTE — Telephone Encounter (Signed)
I messaged [employer] and Ovid Curd with [employer] asking them to advise. I also tried calling patient to discuss. No answer. No VM to LM.

## 2021-03-08 ENCOUNTER — Telehealth: Payer: Self-pay | Admitting: Orthopedic Surgery

## 2021-03-08 NOTE — Telephone Encounter (Signed)
Patient's husband called. He says she is doing really well.

## 2021-03-10 DIAGNOSIS — M19012 Primary osteoarthritis, left shoulder: Secondary | ICD-10-CM

## 2021-03-13 ENCOUNTER — Telehealth: Payer: Self-pay | Admitting: Orthopedic Surgery

## 2021-03-13 NOTE — Telephone Encounter (Signed)
Pt's husband Gwyndolyn Saxon called stating pt is constipated. He is asking for a call back as soon as possible for medical advice. Phone number is [phone].

## 2021-03-13 NOTE — Telephone Encounter (Signed)
Stool softeners since surgery. Started Miralax yesterday. Advised could try Mag citrate or enema

## 2021-03-14 NOTE — Discharge Summary (Signed)
Physician Discharge Summary      Patient ID: HIND CHESLER MRN: [id number] DOB/AGE: November 12, 1950 70 y.o.  Admit date: 03/05/2021 Discharge date: 03/07/2021  Admission Diagnoses:  Active Problems:   S/P reverse total shoulder arthroplasty, left   Arthritis of left shoulder region   Discharge Diagnoses:  Same  Surgeries: Procedure(s): LEFT REVERSE TOTAL SHOULDER ARTHROPLASTY on 03/05/2021   Consultants:   Discharged Condition: Stable  Hospital Course: NIZHONI PARLOW is an 70 y.o. female who was admitted 03/05/2021 with a chief complaint of left shoulder pain, and found to have a diagnosis of left shoulder osteoarthritis.  They were brought to the operating room on 03/05/2021 and underwent the above named procedures.  Pt awoke from anesthesia without complication and was transferred to the floor. On POD1, patient's pain was controlled.  She denies any complaints aside from the early shoulder pain that she was having as the block was wearing off.  She was planned to be discharged home on POD 1 as she is feeling well but she did become dizzy with occupational therapy and blood pressure was around 97/56.  After this episode, plan to hold patient till POD 2.  She worked well with occupational therapy the following day and was subsequently discharged home on POD 2..  Pt will f/u with Dr. Marlou Sa in clinic in ~2 weeks.   Antibiotics given:  Anti-infectives (From admission, onward)    Start     Dose/Rate Route Frequency Ordered Stop   03/05/21 1615  ceFAZolin (ANCEF) IVPB 1 g/50 mL premix        1 g 100 mL/hr over 30 Minutes Intravenous Every 8 hours 03/05/21 1523 03/06/21 0616   03/05/21 1005  vancomycin (VANCOCIN) powder  Status:  Discontinued          As needed 03/05/21 1006 03/05/21 1139   03/05/21 0600  ceFAZolin (ANCEF) IVPB 2g/100 mL premix        2 g 200 mL/hr over 30 Minutes Intravenous On call to O.R. 03/05/21 0549 03/05/21 0745   03/05/21 0554  ceFAZolin (ANCEF) 2-4 GM/100ML-%  IVPB       Note to Pharmacy: Wendall Mola   : cabinet override      03/05/21 0554 03/05/21 0754     .  Recent vital signs:  Vitals:   03/07/21 0512 03/07/21 0907  BP: 131/67 139/73  Pulse: 87 79  Resp: 15 18  Temp: 98.5 F (36.9 C) 98.8 F (37.1 C)  SpO2: 93% 96%    Recent laboratory studies:  Results for orders placed or performed during the hospital encounter of 03/05/21  CBC  Result Value Ref Range   WBC 5.9 4.0 - 10.5 K/uL   RBC 3.67 (L) 3.87 - 5.11 MIL/uL   Hemoglobin 11.6 (L) 12.0 - 15.0 g/dL   HCT 34.2 (L) 36.0 - 46.0 %   MCV 93.2 80.0 - 100.0 fL   MCH 31.6 26.0 - 34.0 pg   MCHC 33.9 30.0 - 36.0 g/dL   RDW 12.2 11.5 - 15.5 %   Platelets 180 150 - 400 K/uL   nRBC 0.0 0.0 - 0.2 %  Basic metabolic panel  Result Value Ref Range   Sodium 137 135 - 145 mmol/L   Potassium 3.8 3.5 - 5.1 mmol/L   Chloride 102 98 - 111 mmol/L   CO2 28 22 - 32 mmol/L   Glucose, Bld 137 (H) 70 - 99 mg/dL   BUN 16 8 - 23 mg/dL   Creatinine, Ser 0.74  0.44 - 1.00 mg/dL   Calcium 8.5 (L) 8.9 - 10.3 mg/dL   GFR, Estimated >60 >60 mL/min   Anion gap 7 5 - 15  CBC  Result Value Ref Range   WBC 5.2 4.0 - 10.5 K/uL   RBC 3.62 (L) 3.87 - 5.11 MIL/uL   Hemoglobin 11.6 (L) 12.0 - 15.0 g/dL   HCT 34.3 (L) 36.0 - 46.0 %   MCV 94.8 80.0 - 100.0 fL   MCH 32.0 26.0 - 34.0 pg   MCHC 33.8 30.0 - 36.0 g/dL   RDW 12.3 11.5 - 15.5 %   Platelets 159 150 - 400 K/uL   nRBC 0.0 0.0 - 0.2 %    Discharge Medications:   Allergies as of 03/07/2021   No Known Allergies      Medication List     TAKE these medications    aspirin 81 MG EC tablet Take 1 tablet (81 mg total) by mouth daily. Swallow whole.   docusate sodium 100 MG capsule Commonly known as: COLACE Take 1 capsule (100 mg total) by mouth 2 (two) times daily.   FOLIC ACID PO Take 1 capsule by mouth daily.   HYDROcodone-acetaminophen 5-325 MG tablet Commonly known as: NORCO/VICODIN Take 1-2 tablets by mouth every 4 (four) hours  as needed for moderate pain (pain score 4-6).   ibuprofen 200 MG tablet Commonly known as: ADVIL Take 600-800 mg by mouth 2 (two) times daily as needed for moderate pain.   levothyroxine 25 MCG tablet Commonly known as: SYNTHROID Take 1 tablet (25 mcg total) by mouth daily before breakfast.   methocarbamol 500 MG tablet Commonly known as: ROBAXIN Take 1 tablet (500 mg total) by mouth every 8 (eight) hours as needed for muscle spasms.   vitamin B-12 1000 MCG tablet Commonly known as: CYANOCOBALAMIN Take 1,000 mcg by mouth daily.        Diagnostic Studies: DG Shoulder Left Port  Result Date: 03/05/2021 CLINICAL DATA:  Follow-up left shoulder arthroplasty EXAM: LEFT SHOULDER COMPARISON:  09/26/2020 FINDINGS: Reverse total arthroplasty of the left shoulder. Components appear well positioned. No radiographically detectable complication. Air in the soft tissues as expected. IMPRESSION: Good appearance following reverse total left shoulder arthroplasty. Electronically Signed   By: Nelson Chimes M.D.   On: 03/05/2021 13:00   MM 3D SCREEN BREAST BILATERAL  Result Date: 02/21/2021 CLINICAL DATA:  Screening. EXAM: DIGITAL SCREENING BILATERAL MAMMOGRAM WITH TOMOSYNTHESIS AND CAD TECHNIQUE: Bilateral screening digital craniocaudal and mediolateral oblique mammograms were obtained. Bilateral screening digital breast tomosynthesis was performed. The images were evaluated with computer-aided detection. COMPARISON:  None. ACR Breast Density Category b: There are scattered areas of fibroglandular density. FINDINGS: In the right breast, a possible mass warrants further evaluation. In the left breast, no findings suspicious for malignancy. IMPRESSION: Further evaluation is suggested for a possible mass in the right breast. RECOMMENDATION: Diagnostic mammogram and possibly ultrasound of the right breast. (Code:[id number]-[id number]) The patient will be contacted regarding the findings, and additional imaging will be  scheduled. BI-RADS CATEGORY  0: Incomplete. Need additional imaging evaluation and/or prior mammograms for comparison. Electronically Signed   By: Ammie Ferrier M.D.   On: 02/21/2021 11:19    Disposition: Discharge disposition: 01-Home or Self Care       Discharge Instructions     Call MD / Call 911   Complete by: As directed    If you experience chest pain or shortness of breath, CALL 911 and be transported to the hospital emergency room.  If you develope a fever above 101 F, pus (white drainage) or increased drainage or redness at the wound, or calf pain, call your surgeon's office.   Constipation Prevention   Complete by: As directed    Drink plenty of fluids.  Prune juice may be helpful.  You may use a stool softener, such as Colace (over the counter) 100 mg twice a day.  Use MiraLax (over the counter) for constipation as needed.   Diet - low sodium heart healthy   Complete by: As directed    Discharge instructions   Complete by: As directed    You may shower, dressing is waterproof.  Do not bathe or soak the operative shoulder in a tub, pool.  Use the CPM machine 3 times a day for one hour each time, increasing the degrees of range of motion daily.  Call the office if you have not heard from the [facility] by Friday.  No lifting with the operative shoulder. Continue use of the sling.  Follow-up with Dr. Marlou Sa in ~2 weeks on your given appointment date.  We will remove your adhesive bandage at that time.    Dental Antibiotics:  In most cases prophylactic antibiotics for Dental procdeures after total joint surgery are not necessary.  Exceptions are as follows:  1. History of prior total joint infection  2. Severely immunocompromised (Organ Transplant, cancer chemotherapy, Rheumatoid biologic meds such as [place]ine)  3. Poorly controlled diabetes (A1C &gt; 8.0, blood glucose over 200)  If you have one of these conditions, contact your surgeon for an antibiotic  prescription, prior to your dental procedure.   Increase activity slowly as tolerated   Complete by: As directed    Post-operative opioid taper instructions:   Complete by: As directed    POST-OPERATIVE OPIOID TAPER INSTRUCTIONS: It is important to wean off of your opioid medication as soon as possible. If you do not need pain medication after your surgery it is ok to stop day one. Opioids include: Codeine, Hydrocodone(Norco, Vicodin), Oxycodone(Percocet, oxycontin) and hydromorphone amongst others.  Long term and even short term use of opiods can cause: Increased pain response Dependence Constipation Depression Respiratory depression And more.  Withdrawal symptoms can include Flu like symptoms Nausea, vomiting And more Techniques to manage these symptoms Hydrate well Eat regular healthy meals Stay active Use relaxation techniques(deep breathing, meditating, yoga) Do Not substitute Alcohol to help with tapering If you have been on opioids for less than two weeks and do not have pain than it is ok to stop all together.  Plan to wean off of opioids This plan should start within one week post op of your joint replacement. Maintain the same interval or time between taking each dose and first decrease the dose.  Cut the total daily intake of opioids by one tablet each day Next start to increase the time between doses. The last dose that should be eliminated is the evening dose.             SignedDonella Stade 03/14/2021, 7:46 AM

## 2021-03-20 ENCOUNTER — Ambulatory Visit (INDEPENDENT_AMBULATORY_CARE_PROVIDER_SITE_OTHER): Payer: PPO | Admitting: Orthopedic Surgery

## 2021-03-20 ENCOUNTER — Ambulatory Visit (INDEPENDENT_AMBULATORY_CARE_PROVIDER_SITE_OTHER): Payer: PPO

## 2021-03-20 ENCOUNTER — Other Ambulatory Visit: Payer: Self-pay

## 2021-03-20 ENCOUNTER — Encounter: Payer: Self-pay | Admitting: Orthopedic Surgery

## 2021-03-20 DIAGNOSIS — Z96612 Presence of left artificial shoulder joint: Secondary | ICD-10-CM

## 2021-03-20 DIAGNOSIS — M25511 Pain in right shoulder: Secondary | ICD-10-CM | POA: Diagnosis not present

## 2021-03-20 NOTE — Progress Notes (Signed)
Post-Op Visit Note   Patient: Susan Davila           Date of Birth: Jan 17, 1951           MRN: [id number] Visit Date: 03/20/2021 PCP: Janith Lima, MD   Assessment & Plan:  Chief Complaint:  Chief Complaint  Patient presents with   Left Shoulder - Routine Post Op    03/05/21 (2w 1d)Left Reverse Total Shoulder Arthroplasty      Visit Diagnoses:  1. History of arthroplasty of left shoulder   2. Acute pain of right shoulder     Plan: Patient is now 2 weeks out left reverse shoulder replacement.  Her pain has improved.  On exam deltoid fires and the incision is intact.  She is got some scapulothoracic crepitus more on the left than the right.  Her right shoulder started hurting some before surgery.  On exam that right shoulder has good rotator cuff strength in well-maintained range of motion.  Taking Norco at night and ibuprofen during the day.  CPM is at 90 degrees.  Plan at this time is to start physical therapy at [facility] physical therapy in 2 weeks.  I will see her back in 2 weeks.  She will need to be out of work until mid January just to let her shoulder recover and get more functional.  Follow-Up Instructions: Return in about 2 weeks (around 04/03/2021).   Orders:  Orders Placed This Encounter  Procedures   XR Shoulder Left   XR Shoulder Right   No orders of the defined types were placed in this encounter.   Imaging: XR Shoulder Right  Result Date: 03/20/2021 AP axillary outlet radiographs right shoulder reviewed.  Shoulder is located.  No acute fracture.  Acromiohumeral distance maintained.  No significant degenerative changes in the glenohumeral joint with mild to moderate degenerative changes in the [facility] joint.   PMFS History: Patient Active Problem List   Diagnosis Date Noted   Arthritis of left shoulder region    S/P reverse total shoulder arthroplasty, left 03/05/2021   Bradycardia 11/12/2020   Visit for screening mammogram 11/12/2020   Routine  general medical examination at a health care facility 07/13/2019   Acquired hypothyroidism 07/13/2019   Hyperlipidemia LDL goal <130 07/13/2019   PULMONARY NODULE 01/31/2010   Past Medical History:  Diagnosis Date   Thyroid disease     Family History  Problem Relation Age of Onset   Dementia Mother 21   Alcohol abuse Father 36    Past Surgical History:  Procedure Laterality Date   BARTHOLIN CYST MARSUPIALIZATION     EYE SURGERY     LIPOSUCTION     TOTAL SHOULDER ARTHROPLASTY Left 03/05/2021   Procedure: LEFT REVERSE TOTAL SHOULDER ARTHROPLASTY;  Surgeon: Meredith Pel, MD;  Location: [place];  Service: Orthopedics;  Laterality: Left;   wisdom teeth Bilateral    Social History   Occupational History   Not on file  Tobacco Use   Smoking status: Former    Types: Cigarettes    Quit date: 09/15/1978    Years since quitting: 42.5   Smokeless tobacco: Never  Vaping Use   Vaping Use: Never used  Substance and Sexual Activity   Alcohol use: Yes    Alcohol/week: 20.0 standard drinks    Types: 20 Glasses of wine per week   Drug use: Never   Sexual activity: Yes    Partners: Male    Birth control/protection: None

## 2021-03-28 ENCOUNTER — Telehealth: Payer: Self-pay

## 2021-03-28 ENCOUNTER — Other Ambulatory Visit: Payer: Self-pay | Admitting: Surgical

## 2021-03-28 MED ORDER — HYDROCODONE-ACETAMINOPHEN 5-325 MG PO TABS: 1.0000 | ORAL_TABLET | Freq: Four times a day (QID) | ORAL | 0 refills | Status: DC | PRN

## 2021-03-28 NOTE — Telephone Encounter (Signed)
Pt called and would like a refill on her hydrocodone

## 2021-03-28 NOTE — Telephone Encounter (Signed)
Sent in

## 2021-03-29 NOTE — Telephone Encounter (Signed)
Attempted to contact patient however had to leave a voicemail. Informed patient that medication request was approved and sent into the pharmacy.

## 2021-04-03 ENCOUNTER — Other Ambulatory Visit: Payer: Self-pay

## 2021-04-03 ENCOUNTER — Ambulatory Visit (INDEPENDENT_AMBULATORY_CARE_PROVIDER_SITE_OTHER): Payer: PPO | Admitting: Orthopedic Surgery

## 2021-04-03 ENCOUNTER — Telehealth: Payer: Self-pay | Admitting: Orthopedic Surgery

## 2021-04-03 DIAGNOSIS — Z96612 Presence of left artificial shoulder joint: Secondary | ICD-10-CM

## 2021-04-03 NOTE — Telephone Encounter (Signed)
I discussed with patient. It was regarding a form she talked about with Dr Marlou Sa at [facility]. She will come by Thursday morning and I will help her with this.

## 2021-04-03 NOTE — Telephone Encounter (Signed)
Pt calling to speak with Lauren. Pt just had an appt 45 mins ago and had another follow up question but only wanted to speak to [place]. The best call back number is [phone].

## 2021-04-07 ENCOUNTER — Encounter: Payer: Self-pay | Admitting: Orthopedic Surgery

## 2021-04-07 NOTE — Progress Notes (Signed)
° °  Post-Op Visit Note   Patient: Susan Davila           Date of Birth: 07-18-1950           MRN: [id number] Visit Date: 04/03/2021 PCP: Janith Lima, MD   Assessment & Plan:  Chief Complaint:  Chief Complaint  Patient presents with   Left Shoulder - Routine Post Op   Visit Diagnoses: No diagnosis found. Plan: Terree is now a month out left reverse shoulder replacement.  She is doing passive range of motion with the machine.  Using Norco.  Dry needling of the scapula is also being done.  She reports some occasional popping in the shoulder.  On exam she has less scapulothoracic crepitus than she had at her first postop visit.  Shoulder feels stable.  Incision intact.  Deltoid fires.  Review of prior radiographs demonstrate some lucency at the base of the greater tuberosity.  No acute fracture but I do want to radiograph that shoulder next clinic visit.  We did have to change from anatomic to reverse.  May consider dynamic evaluation under fluoroscopy for persistent mechanical symptoms.  Hold off on strengthening until return office visit.  Follow-Up Instructions: No follow-ups on file.   Orders:  No orders of the defined types were placed in this encounter.  No orders of the defined types were placed in this encounter.   Imaging: No results found.  PMFS History: Patient Active Problem List   Diagnosis Date Noted   Arthritis of left shoulder region    S/P reverse total shoulder arthroplasty, left 03/05/2021   Bradycardia 11/12/2020   Visit for screening mammogram 11/12/2020   Routine general medical examination at a health care facility 07/13/2019   Acquired hypothyroidism 07/13/2019   Hyperlipidemia LDL goal <130 07/13/2019   PULMONARY NODULE 01/31/2010   Past Medical History:  Diagnosis Date   Thyroid disease     Family History  Problem Relation Age of Onset   Dementia Mother 7   Alcohol abuse Father 53    Past Surgical History:  Procedure Laterality Date    BARTHOLIN CYST MARSUPIALIZATION     EYE SURGERY     LIPOSUCTION     TOTAL SHOULDER ARTHROPLASTY Left 03/05/2021   Procedure: LEFT REVERSE TOTAL SHOULDER ARTHROPLASTY;  Surgeon: Meredith Pel, MD;  Location: [place];  Service: Orthopedics;  Laterality: Left;   wisdom teeth Bilateral    Social History   Occupational History   Not on file  Tobacco Use   Smoking status: Former    Types: Cigarettes    Quit date: 09/15/1978    Years since quitting: 42.5   Smokeless tobacco: Never  Vaping Use   Vaping Use: Never used  Substance and Sexual Activity   Alcohol use: Yes    Alcohol/week: 20.0 standard drinks    Types: 20 Glasses of wine per week   Drug use: Never   Sexual activity: Yes    Partners: Male    Birth control/protection: None

## 2021-04-08 ENCOUNTER — Telehealth: Payer: Self-pay | Admitting: Orthopedic Surgery

## 2021-04-08 NOTE — Telephone Encounter (Signed)
I called. Patient had questions about dictation from Susan Davila and also wanted to make sure it was ok that her follow up appt was not scheduled until 01/13

## 2021-04-08 NOTE — Telephone Encounter (Signed)
Pt called and would to speak with lauren about some issues she is having.  CB [phone]

## 2021-04-23 DIAGNOSIS — M25512 Pain in left shoulder: Secondary | ICD-10-CM | POA: Diagnosis not present

## 2021-04-25 ENCOUNTER — Other Ambulatory Visit: Payer: PPO

## 2021-04-25 DIAGNOSIS — M25512 Pain in left shoulder: Secondary | ICD-10-CM | POA: Diagnosis not present

## 2021-04-26 ENCOUNTER — Encounter: Payer: Self-pay | Admitting: Internal Medicine

## 2021-04-29 DIAGNOSIS — M25512 Pain in left shoulder: Secondary | ICD-10-CM | POA: Diagnosis not present

## 2021-05-01 ENCOUNTER — Encounter: Payer: PPO | Admitting: Orthopedic Surgery

## 2021-05-02 DIAGNOSIS — M25512 Pain in left shoulder: Secondary | ICD-10-CM | POA: Diagnosis not present

## 2021-05-03 ENCOUNTER — Other Ambulatory Visit: Payer: Self-pay

## 2021-05-03 ENCOUNTER — Encounter: Payer: Self-pay | Admitting: Orthopedic Surgery

## 2021-05-03 ENCOUNTER — Ambulatory Visit (INDEPENDENT_AMBULATORY_CARE_PROVIDER_SITE_OTHER): Payer: PPO | Admitting: Orthopedic Surgery

## 2021-05-03 ENCOUNTER — Ambulatory Visit (INDEPENDENT_AMBULATORY_CARE_PROVIDER_SITE_OTHER): Payer: PPO

## 2021-05-03 DIAGNOSIS — G8929 Other chronic pain: Secondary | ICD-10-CM

## 2021-05-03 DIAGNOSIS — M25512 Pain in left shoulder: Secondary | ICD-10-CM | POA: Diagnosis not present

## 2021-05-03 NOTE — Progress Notes (Signed)
Post-Op Visit Note   Patient: Susan Davila           Date of Birth: 1950-09-16           MRN: [id number] Visit Date: 05/03/2021 PCP: Janith Lima, MD   Assessment & Plan:  Chief Complaint:  Chief Complaint  Patient presents with   Left Shoulder - Follow-up   Visit Diagnoses:  1. Chronic left shoulder pain     Plan: Patient is a 71 year old female who presents s/p left reverse total shoulder arthroplasty on 03/05/2021.  Doing well overall and feels she is progressing in regards to her pain control.  She did have increased pain last night after therapy session yesterday where she feels she did too many tricep extensions.  She is currently in physical therapy working on tricep extension, bicep flexion, dumbbell overhead press, shoulder pulleys, range of motion exercises.  She has difficulty with sleeping continuing.  Mostly localizes pain to the anterior aspect of the shoulder.  Occasional shoulder blade pain.  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  On exam, patient has 10 to 15 degrees of external rotation, 50 degrees AB duction, 95 degrees forward flexion.  Incision is well-healed without evidence of infection.  Axillary nerve is intact with deltoid firing.  Subscapularis intact with good strength.  2+ radial pulse of the operative extremity.  Mild to moderate tenderness anteriorly around the midportion of the incision.  There is some coarseness with primarily passive AB duction of the operative arm.  Plan is to continue with physical therapy to focus on primarily passive range of motion.  Her strength is overall pretty good.  She is okay to return to work on Monday.  Therapy prescription given for 6 more weeks of PT.  Left shoulder radiographs were taken today and demonstrate no significant change compared with the prior postop radiographs from about 2 weeks out from procedure.  Follow-up in 6 weeks for clinical recheck.  Follow-Up Instructions: No  follow-ups on file.   Orders:  Orders Placed This Encounter  Procedures   XR Shoulder Left   No orders of the defined types were placed in this encounter.   Imaging: No results found.  PMFS History: Patient Active Problem List   Diagnosis Date Noted   Arthritis of left shoulder region    S/P reverse total shoulder arthroplasty, left 03/05/2021   Bradycardia 11/12/2020   Visit for screening mammogram 11/12/2020   Routine general medical examination at a health care facility 07/13/2019   Acquired hypothyroidism 07/13/2019   Hyperlipidemia LDL goal <130 07/13/2019   PULMONARY NODULE 01/31/2010   Past Medical History:  Diagnosis Date   Thyroid disease     Family History  Problem Relation Age of Onset   Dementia Mother 53   Alcohol abuse Father 37    Past Surgical History:  Procedure Laterality Date   BARTHOLIN CYST MARSUPIALIZATION     EYE SURGERY     LIPOSUCTION     TOTAL SHOULDER ARTHROPLASTY Left 03/05/2021   Procedure: LEFT REVERSE TOTAL SHOULDER ARTHROPLASTY;  Surgeon: Meredith Pel, MD;  Location: [place];  Service: Orthopedics;  Laterality: Left;   wisdom teeth Bilateral    Social History   Occupational History   Not on file  Tobacco Use   Smoking status: Former    Types: Cigarettes    Quit date: 09/15/1978    Years since quitting: 42.6   Smokeless tobacco: Never  Vaping  Use   Vaping Use: Never used  Substance and Sexual Activity   Alcohol use: Yes    Alcohol/week: 20.0 standard drinks    Types: 20 Glasses of wine per week   Drug use: Never   Sexual activity: Yes    Partners: Male    Birth control/protection: None

## 2021-05-06 DIAGNOSIS — M25512 Pain in left shoulder: Secondary | ICD-10-CM | POA: Diagnosis not present

## 2021-05-14 DIAGNOSIS — M9901 Segmental and somatic dysfunction of cervical region: Secondary | ICD-10-CM | POA: Diagnosis not present

## 2021-05-14 DIAGNOSIS — M9902 Segmental and somatic dysfunction of thoracic region: Secondary | ICD-10-CM | POA: Diagnosis not present

## 2021-05-14 DIAGNOSIS — M9904 Segmental and somatic dysfunction of sacral region: Secondary | ICD-10-CM | POA: Diagnosis not present

## 2021-05-14 DIAGNOSIS — M9903 Segmental and somatic dysfunction of lumbar region: Secondary | ICD-10-CM | POA: Diagnosis not present

## 2021-05-15 DIAGNOSIS — M25512 Pain in left shoulder: Secondary | ICD-10-CM | POA: Diagnosis not present

## 2021-05-15 DIAGNOSIS — M9902 Segmental and somatic dysfunction of thoracic region: Secondary | ICD-10-CM | POA: Diagnosis not present

## 2021-05-15 DIAGNOSIS — M9903 Segmental and somatic dysfunction of lumbar region: Secondary | ICD-10-CM | POA: Diagnosis not present

## 2021-05-15 DIAGNOSIS — M9901 Segmental and somatic dysfunction of cervical region: Secondary | ICD-10-CM | POA: Diagnosis not present

## 2021-05-15 DIAGNOSIS — M9904 Segmental and somatic dysfunction of sacral region: Secondary | ICD-10-CM | POA: Diagnosis not present

## 2021-05-20 ENCOUNTER — Other Ambulatory Visit: Payer: PPO

## 2021-05-21 DIAGNOSIS — M9903 Segmental and somatic dysfunction of lumbar region: Secondary | ICD-10-CM | POA: Diagnosis not present

## 2021-05-21 DIAGNOSIS — M9901 Segmental and somatic dysfunction of cervical region: Secondary | ICD-10-CM | POA: Diagnosis not present

## 2021-05-21 DIAGNOSIS — M9904 Segmental and somatic dysfunction of sacral region: Secondary | ICD-10-CM | POA: Diagnosis not present

## 2021-05-21 DIAGNOSIS — M9902 Segmental and somatic dysfunction of thoracic region: Secondary | ICD-10-CM | POA: Diagnosis not present

## 2021-05-22 ENCOUNTER — Ambulatory Visit
Admission: RE | Admit: 2021-05-22 | Discharge: 2021-05-22 | Disposition: A | Discharge status: Home / Self Care | Payer: PPO | Source: Ambulatory Visit | Attending: Internal Medicine | Admitting: Internal Medicine

## 2021-05-22 ENCOUNTER — Other Ambulatory Visit: Payer: Self-pay

## 2021-05-22 DIAGNOSIS — R922 Inconclusive mammogram: Secondary | ICD-10-CM | POA: Diagnosis not present

## 2021-05-22 DIAGNOSIS — R928 Other abnormal and inconclusive findings on diagnostic imaging of breast: Secondary | ICD-10-CM

## 2021-05-22 DIAGNOSIS — M25512 Pain in left shoulder: Secondary | ICD-10-CM | POA: Diagnosis not present

## 2021-05-27 DIAGNOSIS — M25512 Pain in left shoulder: Secondary | ICD-10-CM | POA: Diagnosis not present

## 2021-05-28 DIAGNOSIS — M9902 Segmental and somatic dysfunction of thoracic region: Secondary | ICD-10-CM | POA: Diagnosis not present

## 2021-05-28 DIAGNOSIS — M9904 Segmental and somatic dysfunction of sacral region: Secondary | ICD-10-CM | POA: Diagnosis not present

## 2021-05-28 DIAGNOSIS — M9901 Segmental and somatic dysfunction of cervical region: Secondary | ICD-10-CM | POA: Diagnosis not present

## 2021-05-28 DIAGNOSIS — M9903 Segmental and somatic dysfunction of lumbar region: Secondary | ICD-10-CM | POA: Diagnosis not present

## 2021-05-29 ENCOUNTER — Other Ambulatory Visit: Payer: PPO

## 2021-05-30 DIAGNOSIS — M25512 Pain in left shoulder: Secondary | ICD-10-CM | POA: Diagnosis not present

## 2021-06-03 DIAGNOSIS — M25512 Pain in left shoulder: Secondary | ICD-10-CM | POA: Diagnosis not present

## 2021-06-04 DIAGNOSIS — M9901 Segmental and somatic dysfunction of cervical region: Secondary | ICD-10-CM | POA: Diagnosis not present

## 2021-06-04 DIAGNOSIS — M9902 Segmental and somatic dysfunction of thoracic region: Secondary | ICD-10-CM | POA: Diagnosis not present

## 2021-06-04 DIAGNOSIS — M9903 Segmental and somatic dysfunction of lumbar region: Secondary | ICD-10-CM | POA: Diagnosis not present

## 2021-06-04 DIAGNOSIS — M9904 Segmental and somatic dysfunction of sacral region: Secondary | ICD-10-CM | POA: Diagnosis not present

## 2021-06-05 DIAGNOSIS — M25512 Pain in left shoulder: Secondary | ICD-10-CM | POA: Diagnosis not present

## 2021-06-10 DIAGNOSIS — M25512 Pain in left shoulder: Secondary | ICD-10-CM | POA: Diagnosis not present

## 2021-06-12 ENCOUNTER — Ambulatory Visit: Payer: PPO | Admitting: Orthopedic Surgery

## 2021-06-12 ENCOUNTER — Other Ambulatory Visit: Payer: Self-pay

## 2021-06-12 ENCOUNTER — Telehealth: Payer: Self-pay | Admitting: Internal Medicine

## 2021-06-12 ENCOUNTER — Encounter: Payer: Self-pay | Admitting: Orthopedic Surgery

## 2021-06-12 ENCOUNTER — Other Ambulatory Visit: Payer: Self-pay | Admitting: Internal Medicine

## 2021-06-12 DIAGNOSIS — E039 Hypothyroidism, unspecified: Secondary | ICD-10-CM

## 2021-06-12 DIAGNOSIS — Z96612 Presence of left artificial shoulder joint: Secondary | ICD-10-CM

## 2021-06-12 MED ORDER — AMOXICILLIN 500 MG PO TABS: ORAL_TABLET | ORAL | 0 refills | Status: DC

## 2021-06-12 MED ORDER — LEVOTHYROXINE SODIUM 25 MCG PO TABS: 25.0000 ug | ORAL_TABLET | Freq: Every day | ORAL | 0 refills | Status: DC

## 2021-06-12 NOTE — Progress Notes (Signed)
Post-Op Visit Note   Patient: Susan Davila           Date of Birth: Jun 15, 1950           MRN: [id number] Visit Date: 06/12/2021 PCP: Janith Lima, MD   Assessment & Plan:  Chief Complaint:  Chief Complaint  Patient presents with   Left Shoulder - Follow-up    03/05/2021 left reverse total shoulder arthroplasty   Visit Diagnoses:  1. History of arthroplasty of left shoulder     Plan: Patient is a 71 year old female who presents s/p left reverse shoulder arthroplasty on 03/05/2021.  She is doing well and continuing with physical therapy.  She has 4 sessions left.  She is mostly working on achieving external rotation and PT.  She occasionally takes ibuprofen but nothing consistent.  Overall her pain is very well controlled and she is pleased with her result.  No instability of the shoulder.  She sleeps well at night.  On exam, she has 15 degrees external rotation, 50 degrees abduction, 85 degrees forward flexion.  Axillary nerve is intact with deltoid firing.  Subscap strength rated 5/5.  Incision is very well-healed without any evidence of infection or dehiscence.  She will continue with physical therapy and she wants to continue going about once per month over the next several months to optimize her range of motion.  Counseled her on dental antibiotic prophylaxis.  She will follow-up with the office as needed.  Sent in a prescription for dental antibiotics today.  Follow-Up Instructions: No follow-ups on file.   Orders:  No orders of the defined types were placed in this encounter.  Meds ordered this encounter  Medications   amoxicillin (AMOXIL) 500 MG tablet    Sig: Take 2000 mg (#4 tablets) 60 minutes prior to dental procedure.    Dispense:  8 tablet    Refill:  0    Imaging: No results found.  PMFS History: Patient Active Problem List   Diagnosis Date Noted   Arthritis of left shoulder region    S/P reverse total shoulder arthroplasty, left 03/05/2021    Bradycardia 11/12/2020   Visit for screening mammogram 11/12/2020   Routine general medical examination at a health care facility 07/13/2019   Acquired hypothyroidism 07/13/2019   Hyperlipidemia LDL goal <130 07/13/2019   PULMONARY NODULE 01/31/2010   Past Medical History:  Diagnosis Date   Thyroid disease     Family History  Problem Relation Age of Onset   Dementia Mother 40   Alcohol abuse Father 42    Past Surgical History:  Procedure Laterality Date   BARTHOLIN CYST MARSUPIALIZATION     EYE SURGERY     LIPOSUCTION     TOTAL SHOULDER ARTHROPLASTY Left 03/05/2021   Procedure: LEFT REVERSE TOTAL SHOULDER ARTHROPLASTY;  Surgeon: Meredith Pel, MD;  Location: [place];  Service: Orthopedics;  Laterality: Left;   wisdom teeth Bilateral    Social History   Occupational History   Not on file  Tobacco Use   Smoking status: Former    Types: Cigarettes    Quit date: 09/15/1978    Years since quitting: 42.7   Smokeless tobacco: Never  Vaping Use   Vaping Use: Never used  Substance and Sexual Activity   Alcohol use: Yes    Alcohol/week: 20.0 standard drinks    Types: 20 Glasses of wine per week   Drug use: Never   Sexual activity: Yes    Partners: Male  Birth control/protection: None

## 2021-06-12 NOTE — Telephone Encounter (Signed)
Patient calling in about refill request  Patient has scheduled med refill for first available ov 04.05.23.. wants to know if provider is willing to write short supply rx until visit  levothyroxine (SYNTHROID) 25 MCG tablet  [facility] [id number] - [place], [place] Phone:  [phone]  Fax:  [phone]

## 2021-06-17 DIAGNOSIS — M25512 Pain in left shoulder: Secondary | ICD-10-CM | POA: Diagnosis not present

## 2021-06-19 DIAGNOSIS — M25512 Pain in left shoulder: Secondary | ICD-10-CM | POA: Diagnosis not present

## 2021-06-24 DIAGNOSIS — M25512 Pain in left shoulder: Secondary | ICD-10-CM | POA: Diagnosis not present

## 2021-06-25 ENCOUNTER — Encounter: Payer: Self-pay | Admitting: Internal Medicine

## 2021-07-01 DIAGNOSIS — M25512 Pain in left shoulder: Secondary | ICD-10-CM | POA: Diagnosis not present

## 2021-07-03 DIAGNOSIS — M25512 Pain in left shoulder: Secondary | ICD-10-CM | POA: Diagnosis not present

## 2021-07-08 DIAGNOSIS — M25512 Pain in left shoulder: Secondary | ICD-10-CM | POA: Diagnosis not present

## 2021-07-09 DIAGNOSIS — M9902 Segmental and somatic dysfunction of thoracic region: Secondary | ICD-10-CM | POA: Diagnosis not present

## 2021-07-09 DIAGNOSIS — M9901 Segmental and somatic dysfunction of cervical region: Secondary | ICD-10-CM | POA: Diagnosis not present

## 2021-07-09 DIAGNOSIS — M9904 Segmental and somatic dysfunction of sacral region: Secondary | ICD-10-CM | POA: Diagnosis not present

## 2021-07-09 DIAGNOSIS — M9903 Segmental and somatic dysfunction of lumbar region: Secondary | ICD-10-CM | POA: Diagnosis not present

## 2021-07-10 DIAGNOSIS — M25512 Pain in left shoulder: Secondary | ICD-10-CM | POA: Diagnosis not present

## 2021-07-15 DIAGNOSIS — M25512 Pain in left shoulder: Secondary | ICD-10-CM | POA: Diagnosis not present

## 2021-07-17 DIAGNOSIS — M25512 Pain in left shoulder: Secondary | ICD-10-CM | POA: Diagnosis not present

## 2021-07-22 DIAGNOSIS — M25512 Pain in left shoulder: Secondary | ICD-10-CM | POA: Diagnosis not present

## 2021-07-24 ENCOUNTER — Ambulatory Visit (INDEPENDENT_AMBULATORY_CARE_PROVIDER_SITE_OTHER): Payer: PPO | Admitting: Internal Medicine

## 2021-07-24 ENCOUNTER — Encounter: Payer: Self-pay | Admitting: Internal Medicine

## 2021-07-24 VITALS — BP 136/74 | HR 61 | Temp 97.6°F | Resp 16 | Ht 68.0 in | Wt 145.0 lb

## 2021-07-24 DIAGNOSIS — E039 Hypothyroidism, unspecified: Secondary | ICD-10-CM | POA: Diagnosis not present

## 2021-07-24 LAB — CBC WITH DIFFERENTIAL/PLATELET
Basophils Absolute: 0 10*3/uL (ref 0.0–0.1)
Basophils Relative: 1.1 % (ref 0.0–3.0)
Eosinophils Absolute: 0.1 10*3/uL (ref 0.0–0.7)
Eosinophils Relative: 2.9 % (ref 0.0–5.0)
HCT: 40 % (ref 36.0–46.0)
Hemoglobin: 13.6 g/dL (ref 12.0–15.0)
Lymphocytes Relative: 38.2 % (ref 12.0–46.0)
Lymphs Abs: 1.3 10*3/uL (ref 0.7–4.0)
MCHC: 33.9 g/dL (ref 30.0–36.0)
MCV: 93.2 fl (ref 78.0–100.0)
Monocytes Absolute: 0.4 10*3/uL (ref 0.1–1.0)
Monocytes Relative: 10.3 % (ref 3.0–12.0)
Neutro Abs: 1.7 10*3/uL (ref 1.4–7.7)
Neutrophils Relative %: 47.5 % (ref 43.0–77.0)
Platelets: 215 10*3/uL (ref 150.0–400.0)
RBC: 4.29 Mil/uL (ref 3.87–5.11)
RDW: 13.3 % (ref 11.5–15.5)
WBC: 3.5 10*3/uL — ABNORMAL LOW (ref 4.0–10.5)

## 2021-07-24 LAB — TSH: TSH: 1.54 u[IU]/mL (ref 0.35–5.50)

## 2021-07-24 MED ORDER — LEVOTHYROXINE SODIUM 25 MCG PO TABS: 25.0000 ug | ORAL_TABLET | Freq: Every day | ORAL | 1 refills | Status: DC

## 2021-07-24 NOTE — Progress Notes (Signed)
? ?Subjective:  ?Patient ID: Susan Davila, female    DOB: 1951/03/13  Age: 71 y.o. MRN: [id number] ? ?CC: Hypothyroidism ? ? ?HPI ?Susan Davila presents for f/up - ? ?She complains of mild intermittent constipation and she has had intentional weight loss.  She has shoulder pain but is improving after recent shoulder surgery. ? ?Outpatient Medications Prior to Visit  ?Medication Sig Dispense Refill  ? amoxicillin (AMOXIL) 500 MG tablet Take 2000 mg (#4 tablets) 60 minutes prior to dental procedure. (Patient not taking: Reported on 07/24/2021) 8 tablet 0  ? FOLIC ACID PO Take 1 capsule by mouth daily. (Patient not taking: Reported on 07/24/2021)    ? ibuprofen (ADVIL) 200 MG tablet Take 600-800 mg by mouth 2 (two) times daily as needed for moderate pain. (Patient not taking: Reported on 07/24/2021)    ? vitamin B-12 (CYANOCOBALAMIN) 1000 MCG tablet Take 1,000 mcg by mouth daily. (Patient not taking: Reported on 07/24/2021)    ? levothyroxine (SYNTHROID) 25 MCG tablet Take 1 tablet (25 mcg total) by mouth daily before breakfast. 60 tablet 0  ? ?No facility-administered medications prior to visit.  ? ? ?ROS ?Review of Systems  ?Constitutional:  Negative for fatigue and unexpected weight change.  ?HENT: Negative.    ?Eyes: Negative.   ?Respiratory:  Negative for cough, chest tightness, shortness of breath and wheezing.   ?Cardiovascular:  Negative for chest pain, palpitations and leg swelling.  ?Gastrointestinal:  Positive for constipation. Negative for abdominal pain, diarrhea, nausea and vomiting.  ?Endocrine: Negative.  Negative for cold intolerance and heat intolerance.  ?Genitourinary: Negative.  Negative for difficulty urinating.  ?Musculoskeletal:  Positive for arthralgias. Negative for back pain and myalgias.  ?Skin: Negative.   ?Neurological: Negative.  Negative for dizziness, weakness, light-headedness and headaches.  ?Hematological:  Negative for adenopathy. Does not bruise/bleed easily.  ? ?Objective:  ?BP 136/74  (BP Location: Right Arm, Patient Position: Sitting, Cuff Size: Normal)   Pulse 61   Temp 97.6 ?F (36.4 ?C) (Oral)   Resp 16   Ht '5\' 8"'$  (1.727 m)   Wt 145 lb (65.8 kg)   SpO2 99%   BMI 22.05 kg/m?  ? ?BP Readings from Last 3 Encounters:  ?07/24/21 136/74  ?03/07/21 139/73  ?02/20/21 (!) 143/80  ? ? ?Wt Readings from Last 3 Encounters:  ?07/24/21 145 lb (65.8 kg)  ?03/05/21 153 lb (69.4 kg)  ?02/20/21 155 lb 11.2 oz (70.6 kg)  ? ? ?Physical Exam ?Vitals reviewed.  ?Constitutional:   ?   Appearance: Normal appearance.  ?HENT:  ?   Nose: Nose normal.  ?   Mouth/Throat:  ?   Mouth: Mucous membranes are moist.  ?Eyes:  ?   General: No scleral icterus. ?   Conjunctiva/sclera: Conjunctivae normal.  ?Cardiovascular:  ?   Rate and Rhythm: Normal rate and regular rhythm.  ?   Heart sounds: No murmur heard. ?  No gallop.  ?Pulmonary:  ?   Effort: Pulmonary effort is normal.  ?   Breath sounds: No stridor. No wheezing, rhonchi or rales.  ?Abdominal:  ?   Palpations: There is no mass.  ?   Tenderness: There is no abdominal tenderness. There is no guarding.  ?   Hernia: No hernia is present.  ?Musculoskeletal:  ?   Cervical back: Neck supple.  ?   Right lower leg: No edema.  ?Skin: ?   General: Skin is warm and dry.  ?Neurological:  ?   General: No focal  deficit present.  ?   Mental Status: She is alert.  ?Psychiatric:     ?   Mood and Affect: Mood normal.     ?   Behavior: Behavior normal.  ? ? ?Lab Results  ?Component Value Date  ? WBC 3.5 (L) 07/24/2021  ? HGB 13.6 07/24/2021  ? HCT 40.0 07/24/2021  ? PLT 215.0 07/24/2021  ? GLUCOSE 137 (H) 03/06/2021  ? CHOL 183 11/12/2020  ? TRIG 120.0 11/12/2020  ? HDL 79.50 11/12/2020  ? [place] 79 11/12/2020  ? ALT 15 11/12/2020  ? AST 15 11/12/2020  ? NA 137 03/06/2021  ? K 3.8 03/06/2021  ? CL 102 03/06/2021  ? CREATININE 0.74 03/06/2021  ? BUN 16 03/06/2021  ? CO2 28 03/06/2021  ? TSH 1.54 07/24/2021  ? ? ?US BREAST LTD UNI RIGHT INC AXILLA ? ?Result Date: 05/22/2021 ?CLINICAL  DATA:  Screening recall for possible right breast mass. Patient's prior study from 12/24/2016 was not available at the time of the screening interpretation. These images were retrieved shortly after the diagnostic exam and breast ultrasound were performed. EXAM: DIGITAL DIAGNOSTIC UNILATERAL RIGHT MAMMOGRAM WITH TOMOSYNTHESIS AND CAD; ULTRASOUND RIGHT BREAST LIMITED TECHNIQUE: Right digital diagnostic mammography and breast tomosynthesis was performed. The images were evaluated with computer-aided detection.; Targeted ultrasound examination of the right breast was performed COMPARISON:  Current screening study, 02/20/2021. Prior exam dated 12/24/2016. ACR Breast Density Category b: There are scattered areas of fibroglandular density. FINDINGS: The mass in the lateral right breast persists on the diagnostic spot-compression images as an oval circumscribed, approximately 1 cm, mass, unchanged from the prior exam. There are no other masses, no areas of architectural distortion and no suspicious calcifications. Targeted right breast ultrasound is performed, showing a hypoechoic, oval, parallel circumscribed mass at 9 o'clock, 5 cm the nipple, measuring 1.0 x 0.4 x 0.9 cm, consistent with a fibroadenoma and consistent with the mammographic mass. IMPRESSION: 1. No evidence of breast malignancy. 2. Benign right breast mass, stable since 2018, consistent with a fibroadenoma. RECOMMENDATION: Screening mammogram in one year.(Code:SM-B-01Y) I have discussed the findings and recommendations with the patient. If applicable, a reminder letter will be sent to the patient regarding the next appointment. BI-RADS CATEGORY  2: Benign. Electronically Signed   By: Lajean Manes M.D.   On: 05/22/2021 10:34 ? ?MM DIAG BREAST TOMO UNI RIGHT ? ?Result Date: 05/22/2021 ?CLINICAL DATA:  Screening recall for possible right breast mass. Patient's prior study from 12/24/2016 was not available at the time of the screening interpretation. These  images were retrieved shortly after the diagnostic exam and breast ultrasound were performed. EXAM: DIGITAL DIAGNOSTIC UNILATERAL RIGHT MAMMOGRAM WITH TOMOSYNTHESIS AND CAD; ULTRASOUND RIGHT BREAST LIMITED TECHNIQUE: Right digital diagnostic mammography and breast tomosynthesis was performed. The images were evaluated with computer-aided detection.; Targeted ultrasound examination of the right breast was performed COMPARISON:  Current screening study, 02/20/2021. Prior exam dated 12/24/2016. ACR Breast Density Category b: There are scattered areas of fibroglandular density. FINDINGS: The mass in the lateral right breast persists on the diagnostic spot-compression images as an oval circumscribed, approximately 1 cm, mass, unchanged from the prior exam. There are no other masses, no areas of architectural distortion and no suspicious calcifications. Targeted right breast ultrasound is performed, showing a hypoechoic, oval, parallel circumscribed mass at 9 o'clock, 5 cm the nipple, measuring 1.0 x 0.4 x 0.9 cm, consistent with a fibroadenoma and consistent with the mammographic mass. IMPRESSION: 1. No evidence of breast malignancy. 2. Benign right  breast mass, stable since 2018, consistent with a fibroadenoma. RECOMMENDATION: Screening mammogram in one year.(Code:SM-B-01Y) I have discussed the findings and recommendations with the patient. If applicable, a reminder letter will be sent to the patient regarding the next appointment. BI-RADS CATEGORY  2: Benign. Electronically Signed   By: Lajean Manes M.D.   On: 05/22/2021 10:34 ? ? ?Assessment & Plan:  ? ?Wanza was seen today for hypothyroidism. ? ?Diagnoses and all orders for this visit: ? ?Acquired hypothyroidism- Her TSH is in the normal range.  She will stay on the current T4 dosage. ?-     TSH; Future ?-     CBC with Differential/Platelet; Future ?-     CBC with Differential/Platelet ?-     TSH ?-     levothyroxine (SYNTHROID) 25 MCG tablet; Take 1 tablet (25 mcg  total) by mouth daily before breakfast. ? ? ?I am having Shani A. Calder maintain her ibuprofen, FOLIC ACID PO, vitamin B-12, amoxicillin, and levothyroxine. ? ?Meds ordered this encounter  ?Medications  ? levothyroxin

## 2021-07-25 ENCOUNTER — Encounter: Payer: Self-pay | Admitting: Internal Medicine

## 2021-07-31 DIAGNOSIS — M25512 Pain in left shoulder: Secondary | ICD-10-CM | POA: Diagnosis not present

## 2021-08-07 DIAGNOSIS — M25512 Pain in left shoulder: Secondary | ICD-10-CM | POA: Diagnosis not present

## 2021-08-14 ENCOUNTER — Telehealth: Payer: Self-pay | Admitting: Internal Medicine

## 2021-08-14 DIAGNOSIS — M25512 Pain in left shoulder: Secondary | ICD-10-CM | POA: Diagnosis not present

## 2021-08-14 NOTE — Telephone Encounter (Signed)
N/A unable to leave a message for patient to call back to schedule Medicare Annual Wellness Visit  ? ?No hx of AWV eligible as of 02/19/18 ? ?Please schedule at anytime with [facility] if patient calls the office back.   ? ?45 Minutes appointment  ? ?Any questions, please call me at [phone]  ?

## 2021-10-07 ENCOUNTER — Encounter (HOSPITAL_COMMUNITY): Payer: Self-pay | Admitting: Emergency Medicine

## 2021-10-07 ENCOUNTER — Other Ambulatory Visit: Payer: Self-pay

## 2021-10-07 ENCOUNTER — Ambulatory Visit (HOSPITAL_COMMUNITY)
Admission: EM | Admit: 2021-10-07 | Discharge: 2021-10-07 | Disposition: A | Discharge status: Home / Self Care | Payer: PPO | Attending: Emergency Medicine | Admitting: Emergency Medicine

## 2021-10-07 DIAGNOSIS — J0100 Acute maxillary sinusitis, unspecified: Secondary | ICD-10-CM

## 2021-10-07 MED ORDER — AMOXICILLIN-POT CLAVULANATE 875-125 MG PO TABS: 1.0000 | ORAL_TABLET | Freq: Two times a day (BID) | ORAL | 0 refills | Status: DC

## 2021-10-07 NOTE — Discharge Instructions (Addendum)
Today you are being treated for sinus infection  Take Augmentin twice daily for 7 days, begin to see improvement in 48 hours and steady progression from  May attempt the following below in addition  Perform sinus irrigation 2-3 times a day with a [employer] sinus rinse kit and distilled water.  Do not use tap water.  You can use plain over-the-counter Mucinex every 6 hours to break up the stickiness of the mucus so your body can clear it.  Increase your oral fluid intake to thin out your mucus so that is also able for your body to clear more easily.   You can take Tylenol and/or Ibuprofen as needed for fever reduction and pain relief.   For cough: honey 1/2 to 1 teaspoon (you can dilute the honey in water or another fluid).  You can also use guaifenesin and dextromethorphan for cough. You can use a humidifier for chest congestion and cough.  If you don't have a humidifier, you can sit in the bathroom with the hot shower running.      For sore throat: try warm salt water gargles, cepacol lozenges, throat spray, warm tea or water with lemon/honey, popsicles or ice, or OTC cold relief medicine for throat discomfort.   For congestion: take a daily anti-histamine like Zyrtec, Claritin, and a oral decongestant, such as pseudoephedrine.  You can also use Flonase 1-2 sprays in each nostril daily.   It is important to stay hydrated: drink plenty of fluids (water, gatorade/powerade/pedialyte, juices, or teas) to keep your throat moisturized and help further relieve irritation/discomfort.

## 2021-10-07 NOTE — ED Provider Notes (Signed)
[facility]    CSN: [id number] Arrival date & time: 10/07/21  [id number]      History   Chief Complaint Chief Complaint  Patient presents with   Sore Throat   Otalgia    HPI Susan Davila is a 71 y.o. female.   Patient presents for nasal congestion, bilateral ear fullness and aching, sore throat and intermittent generalized headache and a mild nonproductive cough for 7 days.  Tolerating food and liquids.  No known sick contacts.  Endorses symptoms are not improving causing general malaise and fatigue.  Has attempted use of antihistaminic and DayQuil which has been minimally helpful.  Denies shortness of breath, wheezing, fever, chills or body aches.  Past Medical History:  Diagnosis Date   Thyroid disease     Patient Active Problem List   Diagnosis Date Noted   Arthritis of left shoulder region    Bradycardia 11/12/2020   Visit for screening mammogram 11/12/2020   Routine general medical examination at a health care facility 07/13/2019   Acquired hypothyroidism 07/13/2019   Hyperlipidemia LDL goal <130 07/13/2019   PULMONARY NODULE 01/31/2010    Past Surgical History:  Procedure Laterality Date   BARTHOLIN CYST MARSUPIALIZATION     EYE SURGERY     LIPOSUCTION     TOTAL SHOULDER ARTHROPLASTY Left 03/05/2021   Procedure: LEFT REVERSE TOTAL SHOULDER ARTHROPLASTY;  Surgeon: Meredith Pel, MD;  Location: [facility];  Service: Orthopedics;  Laterality: Left;   wisdom teeth Bilateral     OB History   No obstetric history on file.      Home Medications    Prior to Admission medications   Medication Sig Start Date End Date Taking? Authorizing Provider  amoxicillin (AMOXIL) 500 MG tablet Take 2000 mg (#4 tablets) 60 minutes prior to dental procedure. Patient not taking: Reported on 07/24/2021 06/12/21   Magnant, Gerrianne Scale, PA-C  FOLIC ACID PO Take 1 capsule by mouth daily. Patient not taking: Reported on 07/24/2021    [provider]  ibuprofen (ADVIL)  200 MG tablet Take 600-800 mg by mouth 2 (two) times daily as needed for moderate pain. Patient not taking: Reported on 07/24/2021    [provider]  levothyroxine (SYNTHROID) 25 MCG tablet Take 1 tablet (25 mcg total) by mouth daily before breakfast. 07/24/21   Janith Lima, MD  vitamin B-12 (CYANOCOBALAMIN) 1000 MCG tablet Take 1,000 mcg by mouth daily. Patient not taking: Reported on 07/24/2021    [provider]    Family History Family History  Problem Relation Age of Onset   Dementia Mother 31   Alcohol abuse Father 24    Social History Social History   Tobacco Use   Smoking status: Former    Types: Cigarettes    Quit date: 09/15/1978    Years since quitting: 43.0    Passive exposure: Never   Smokeless tobacco: Never  Vaping Use   Vaping Use: Never used  Substance Use Topics   Alcohol use: Yes    Alcohol/week: 20.0 standard drinks of alcohol    Types: 20 Glasses of wine per week   Drug use: Never     Allergies   Patient has no known allergies.   Review of Systems Review of Systems Defer to HPI    Physical Exam Triage Vital Signs ED Triage Vitals  Enc Vitals Group     BP 10/07/21 0929 (!) 137/93     Pulse Rate 10/07/21 0929 63  Resp 10/07/21 0929 18     Temp 10/07/21 0929 98 F (36.7 C)     Temp Source 10/07/21 0929 Oral     SpO2 10/07/21 0929 98 %     Weight --      Height --      Head Circumference --      Peak Flow --      Pain Score 10/07/21 0927 2     Pain Loc --      Pain Edu? --      Excl. in [place]? --    No data found.  Updated Vital Signs BP (!) 137/93 (BP Location: Left Arm)   Pulse 63   Temp 98 F (36.7 C) (Oral)   Resp 18   SpO2 98%   Visual Acuity Right Eye Distance:   Left Eye Distance:   Bilateral Distance:    Right Eye Near:   Left Eye Near:    Bilateral Near:     Physical Exam Constitutional:      Appearance: She is well-developed.  HENT:     Head: Normocephalic.     Right Ear: Tympanic  membrane and ear canal normal.     Left Ear: Tympanic membrane and ear canal normal.     Nose: Congestion and rhinorrhea present.     Right Sinus: No maxillary sinus tenderness or frontal sinus tenderness.     Left Sinus: Maxillary sinus tenderness present. No frontal sinus tenderness.     Mouth/Throat:     Mouth: Mucous membranes are moist.     Pharynx: Posterior oropharyngeal erythema present.     Tonsils: No tonsillar exudate. 0 on the right. 0 on the left.  Cardiovascular:     Rate and Rhythm: Normal rate and regular rhythm.     Heart sounds: Normal heart sounds.  Pulmonary:     Effort: Pulmonary effort is normal.     Breath sounds: Normal breath sounds.  Musculoskeletal:     Cervical back: Normal range of motion and neck supple.  Skin:    General: Skin is warm and dry.  Neurological:     General: No focal deficit present.     Mental Status: She is alert and oriented to person, place, and time.  Psychiatric:        Mood and Affect: Mood normal.        Behavior: Behavior normal.      UC Treatments / Results  Labs (all labs ordered are listed, but only abnormal results are displayed) Labs Reviewed - No data to display  EKG   Radiology No results found.  Procedures Procedures (including critical care time)  Medications Ordered in UC Medications - No data to display  Initial Impression / Assessment and Plan / UC Course  I have reviewed the triage vital signs and the nursing notes.  Pertinent labs & imaging results that were available during my care of the patient were reviewed by me and considered in my medical decision making (see chart for details).  Acute nonrecurrent maxillary sinusitis  Vital signs are stable and patient is in no signs of distress, saturations 98% on room air lungs are clear to auscultation, low suspicion for pneumonia, pneumothorax or bronchitis, tenderness is noted within the maxillary sinuses with prominent congestion within the nasal  turbinates, as symptoms are present for 7 days with no signs of improvement, we will begin coverage for bacteria, Augmentin 7-day course prescribed, may use additional over-the-counter medications as needed for supportive care, may  follow-up with this urgent care as needed Final Clinical Impressions(s) / UC Diagnoses   Final diagnoses:  None   Discharge Instructions   None    ED Prescriptions   None    PDMP not reviewed this encounter.   Hans Eden, [place] 10/07/21 [phone]

## 2021-10-07 NOTE — ED Triage Notes (Signed)
Complains of sore throat and bilateral ear aching.  Onset one week ago.  Patient has had sinus drainage.  Complains of light headache, "just don't feel good".  Has taken dayquil, ibuprofen, Claritin and no real relief.  Complains of being tired and no energy

## 2022-01-14 DIAGNOSIS — M7130 Other bursal cyst, unspecified site: Secondary | ICD-10-CM | POA: Diagnosis not present

## 2022-01-14 DIAGNOSIS — X32XXXA Exposure to sunlight, initial encounter: Secondary | ICD-10-CM | POA: Diagnosis not present

## 2022-01-14 DIAGNOSIS — L570 Actinic keratosis: Secondary | ICD-10-CM | POA: Diagnosis not present

## 2022-01-14 DIAGNOSIS — L820 Inflamed seborrheic keratosis: Secondary | ICD-10-CM | POA: Diagnosis not present

## 2022-02-07 ENCOUNTER — Telehealth: Payer: Self-pay | Admitting: Internal Medicine

## 2022-02-07 NOTE — Telephone Encounter (Signed)
Left message for patient to call back to schedule Medicare Annual Wellness Visit   No hx of AWV eligible as of 02/19/18  Please schedule at anytime with [facility] if patient calls the office back.     Any questions, please call me at [phone]  

## 2022-02-24 DIAGNOSIS — M9901 Segmental and somatic dysfunction of cervical region: Secondary | ICD-10-CM | POA: Diagnosis not present

## 2022-02-24 DIAGNOSIS — M9902 Segmental and somatic dysfunction of thoracic region: Secondary | ICD-10-CM | POA: Diagnosis not present

## 2022-02-24 DIAGNOSIS — M9904 Segmental and somatic dysfunction of sacral region: Secondary | ICD-10-CM | POA: Diagnosis not present

## 2022-02-24 DIAGNOSIS — M9903 Segmental and somatic dysfunction of lumbar region: Secondary | ICD-10-CM | POA: Diagnosis not present

## 2022-02-25 DIAGNOSIS — M9902 Segmental and somatic dysfunction of thoracic region: Secondary | ICD-10-CM | POA: Diagnosis not present

## 2022-02-25 DIAGNOSIS — M9903 Segmental and somatic dysfunction of lumbar region: Secondary | ICD-10-CM | POA: Diagnosis not present

## 2022-02-25 DIAGNOSIS — M9901 Segmental and somatic dysfunction of cervical region: Secondary | ICD-10-CM | POA: Diagnosis not present

## 2022-02-25 DIAGNOSIS — M9904 Segmental and somatic dysfunction of sacral region: Secondary | ICD-10-CM | POA: Diagnosis not present

## 2022-02-26 DIAGNOSIS — M9903 Segmental and somatic dysfunction of lumbar region: Secondary | ICD-10-CM | POA: Diagnosis not present

## 2022-02-26 DIAGNOSIS — M9901 Segmental and somatic dysfunction of cervical region: Secondary | ICD-10-CM | POA: Diagnosis not present

## 2022-02-26 DIAGNOSIS — M9904 Segmental and somatic dysfunction of sacral region: Secondary | ICD-10-CM | POA: Diagnosis not present

## 2022-02-26 DIAGNOSIS — M9902 Segmental and somatic dysfunction of thoracic region: Secondary | ICD-10-CM | POA: Diagnosis not present

## 2022-03-14 ENCOUNTER — Encounter (HOSPITAL_COMMUNITY): Payer: Self-pay

## 2022-03-14 ENCOUNTER — Ambulatory Visit (HOSPITAL_COMMUNITY)
Admission: RE | Admit: 2022-03-14 | Discharge: 2022-03-14 | Disposition: A | Discharge status: Home / Self Care | Payer: PPO | Source: Ambulatory Visit | Attending: Emergency Medicine | Admitting: Emergency Medicine

## 2022-03-14 VITALS — BP 168/87 | HR 68 | Temp 98.4°F | Resp 16

## 2022-03-14 DIAGNOSIS — J069 Acute upper respiratory infection, unspecified: Secondary | ICD-10-CM | POA: Diagnosis not present

## 2022-03-14 MED ORDER — DEXAMETHASONE SODIUM PHOSPHATE 10 MG/ML IJ SOLN
INTRAMUSCULAR | Status: AC
  Filled 2022-03-14: qty 1

## 2022-03-14 MED ORDER — AMOXICILLIN-POT CLAVULANATE 875-125 MG PO TABS: 1.0000 | ORAL_TABLET | Freq: Two times a day (BID) | ORAL | 0 refills | Status: DC

## 2022-03-14 MED ORDER — DEXAMETHASONE SODIUM PHOSPHATE 10 MG/ML IJ SOLN
10.0000 mg | Freq: Once | INTRAMUSCULAR | Status: AC
Start: 2022-03-14 — End: 2022-03-14
  Administered 2022-03-14: 10 mg via INTRAMUSCULAR

## 2022-03-14 MED ORDER — KETOROLAC TROMETHAMINE 30 MG/ML IJ SOLN
30.0000 mg | Freq: Once | INTRAMUSCULAR | Status: AC
  Administered 2022-03-14: 30 mg via INTRAMUSCULAR

## 2022-03-14 MED ORDER — KETOROLAC TROMETHAMINE 30 MG/ML IJ SOLN
INTRAMUSCULAR | Status: AC
  Filled 2022-03-14: qty 1

## 2022-03-14 NOTE — Discharge Instructions (Addendum)
In use of Augmentin every morning and every evening for 7 days as your symptoms have been present for a week without any signs of resolution as they have started to get worse  Given injection of Toradol and Decadron here today in the office to help release pressure from your head, ideally you will start to see improvement in about 30 minutes  For the remainder of the day you may take Tylenol as needed for management of pain and then resumed use of ibuprofen tomorrow as needed   For cough: honey 1/2 to 1 teaspoon (you can dilute the honey in water or another fluid).  You can also use guaifenesin and dextromethorphan for cough. You can use a humidifier for chest congestion and cough.  If you don't have a humidifier, you can sit in the bathroom with the hot shower running.      For sore throat: try warm salt water gargles, cepacol lozenges, throat spray, warm tea or water with lemon/honey, popsicles or ice, or OTC cold relief medicine for throat discomfort.   For congestion: take a daily anti-histamine like Zyrtec, Claritin, and a oral decongestant, such as pseudoephedrine.  You can also use Flonase 1-2 sprays in each nostril daily.   It is important to stay hydrated: drink plenty of fluids (water, gatorade/powerade/pedialyte, juices, or teas) to keep your throat moisturized and help further relieve irritation/discomfort.

## 2022-03-14 NOTE — ED Triage Notes (Signed)
Chief Complaint: Patient having cold symptoms for a week. Nasal drainage, productive cough with green mucus, ear pain, head pain with swallowing on the right back side, post nasal drip. Patient does not have a history of frequent sinus infection.   Onset: Last Saturday  OTC medications tried: Yes- ibuprofen, Mucinex, Dayquil    with mild relief  Sick exposure: No  New foods or medications: No  Recent Travel: No

## 2022-03-14 NOTE — ED Provider Notes (Signed)
[facility]    CSN: [id number] Arrival date & time: 03/14/22  1845      History   Chief Complaint Chief Complaint  Patient presents with   URI   Otalgia    HPI Susan Davila is a 71 y.o. female.   Patient presents with nasal congestion, rhinorrhea and a nonproductive cough beginning 7 days ago.  Felt as if symptoms were improving but then they worsened 1 day ago.  Began to experience a sensation of mucus sitting primarily on the right side of the room, pain with swallowing which causes a sharp shooting pain to the right side of head.  Headache has been intermittent and described as severe when present, has been managing with ibuprofen and a heating pad which has been somewhat helpful.  Symptoms interfering with sleep overnight.  Has been able to tolerate food and liquids up until today.  Has attempted use of Mucinex, DayQuil additionally with some improvement.  Denies shortness of breath, wheezing, fevers.    Past Medical History:  Diagnosis Date   Thyroid disease     Patient Active Problem List   Diagnosis Date Noted   Arthritis of left shoulder region    Bradycardia 11/12/2020   Visit for screening mammogram 11/12/2020   Routine general medical examination at a health care facility 07/13/2019   Acquired hypothyroidism 07/13/2019   Hyperlipidemia LDL goal <130 07/13/2019   PULMONARY NODULE 01/31/2010    Past Surgical History:  Procedure Laterality Date   BARTHOLIN CYST MARSUPIALIZATION     EYE SURGERY     LIPOSUCTION     TOTAL SHOULDER ARTHROPLASTY Left 03/05/2021   Procedure: LEFT REVERSE TOTAL SHOULDER ARTHROPLASTY;  Surgeon: Meredith Pel, MD;  Location: [facility];  Service: Orthopedics;  Laterality: Left;   wisdom teeth Bilateral     OB History   No obstetric history on file.      Home Medications    Prior to Admission medications   Medication Sig Start Date End Date Taking? Authorizing Provider  levothyroxine (SYNTHROID) 25 MCG tablet Take  1 tablet (25 mcg total) by mouth daily before breakfast. 07/24/21  Yes Janith Lima, MD  amoxicillin (AMOXIL) 500 MG tablet Take 2000 mg (#4 tablets) 60 minutes prior to dental procedure. Patient not taking: Reported on 07/24/2021 06/12/21   Magnant, Gerrianne Scale, PA-C  amoxicillin-clavulanate (AUGMENTIN) 875-125 MG tablet Take 1 tablet by mouth every 12 (twelve) hours. 10/07/21   Glady Ouderkirk, Leitha Schuller, NP  FOLIC ACID PO Take 1 capsule by mouth daily. Patient not taking: Reported on 07/24/2021    [provider]  ibuprofen (ADVIL) 200 MG tablet Take 600-800 mg by mouth 2 (two) times daily as needed for moderate pain. Patient not taking: Reported on 07/24/2021    [provider]  vitamin B-12 (CYANOCOBALAMIN) 1000 MCG tablet Take 1,000 mcg by mouth daily. Patient not taking: Reported on 07/24/2021    [provider]    Family History Family History  Problem Relation Age of Onset   Dementia Mother 44   Alcohol abuse Father 23    Social History Social History   Tobacco Use   Smoking status: Former    Types: Cigarettes    Quit date: 09/15/1978    Years since quitting: 43.5    Passive exposure: Never   Smokeless tobacco: Never  Vaping Use   Vaping Use: Never used  Substance Use Topics   Alcohol use: Yes    Alcohol/week: 20.0 standard drinks of alcohol  Types: 20 Glasses of wine per week   Drug use: Never     Allergies   Patient has no known allergies.   Review of Systems Review of Systems  Constitutional: Negative.   HENT:  Positive for congestion and ear pain. Negative for dental problem, drooling, ear discharge, facial swelling, hearing loss, mouth sores, nosebleeds, postnasal drip, rhinorrhea, sinus pressure, sinus pain, sneezing, sore throat, tinnitus, trouble swallowing and voice change.   Respiratory:  Positive for cough. Negative for apnea, choking, chest tightness, shortness of breath, wheezing and stridor.   Cardiovascular: Negative.    Gastrointestinal: Negative.      Physical Exam Triage Vital Signs ED Triage Vitals  Enc Vitals Group     BP 03/14/22 1908 (!) 168/87     Pulse Rate 03/14/22 1908 68     Resp 03/14/22 1908 16     Temp 03/14/22 1908 98.4 F (36.9 C)     Temp Source 03/14/22 1908 Oral     SpO2 03/14/22 1908 100 %     Weight --      Height --      Head Circumference --      Peak Flow --      Pain Score 03/14/22 1905 10     Pain Loc --      Pain Edu? --      Excl. in [place]? --    No data found.  Updated Vital Signs BP (!) 168/87 (BP Location: Left Arm)   Pulse 68   Temp 98.4 F (36.9 C) (Oral)   Resp 16   SpO2 100%   Visual Acuity Right Eye Distance:   Left Eye Distance:   Bilateral Distance:    Right Eye Near:   Left Eye Near:    Bilateral Near:     Physical Exam Constitutional:      Appearance: Normal appearance.  HENT:     Head: Normocephalic.     Right Ear: Tympanic membrane, ear canal and external ear normal.     Left Ear: Tympanic membrane, ear canal and external ear normal.     Nose: Congestion and rhinorrhea present.     Mouth/Throat:     Pharynx: Posterior oropharyngeal erythema present.  Cardiovascular:     Rate and Rhythm: Normal rate and regular rhythm.     Pulses: Normal pulses.     Heart sounds: Normal heart sounds.  Pulmonary:     Effort: Pulmonary effort is normal.     Breath sounds: Normal breath sounds.  Lymphadenopathy:     Cervical: Cervical adenopathy present.  Neurological:     Mental Status: She is alert and oriented to person, place, and time. Mental status is at baseline.  Psychiatric:        Mood and Affect: Mood normal.        Behavior: Behavior normal.      UC Treatments / Results  Labs (all labs ordered are listed, but only abnormal results are displayed) Labs Reviewed - No data to display  EKG   Radiology No results found.  Procedures Procedures (including critical care time)  Medications Ordered in UC Medications - No data  to display  Initial Impression / Assessment and Plan / UC Course  I have reviewed the triage vital signs and the nursing notes.  Pertinent labs & imaging results that were available during my care of the patient were reviewed by me and considered in my medical decision making (see chart for details).  Acute URI  Vitals are stable, while patient visibly appears to be in pain she is no signs of distress nor toxic appearing, sensation to throat is most likely cervical adenopathy and headache is most likely related to infection, discussed this with patient, symptoms have persisted for 7 days and begun to worsen we will provide bacterial coverage, Augmentin prescribed and given injection of Toradol and Decadron in office for management of headache as there are no focal abnormalities on her exam, may follow-up with urgent care as needed if symptoms continue to persist or worsen Final Clinical Impressions(s) / UC Diagnoses   Final diagnoses:  None   Discharge Instructions   None    ED Prescriptions   None    PDMP not reviewed this encounter.   Hans Eden, NP 03/14/22 1933

## 2022-03-15 ENCOUNTER — Other Ambulatory Visit: Payer: Self-pay | Admitting: Internal Medicine

## 2022-03-15 DIAGNOSIS — E039 Hypothyroidism, unspecified: Secondary | ICD-10-CM

## 2022-03-17 ENCOUNTER — Other Ambulatory Visit: Payer: Self-pay | Admitting: Internal Medicine

## 2022-03-17 ENCOUNTER — Telehealth: Payer: Self-pay | Admitting: Internal Medicine

## 2022-03-17 DIAGNOSIS — E039 Hypothyroidism, unspecified: Secondary | ICD-10-CM

## 2022-03-17 MED ORDER — LEVOTHYROXINE SODIUM 25 MCG PO TABS: 25.0000 ug | ORAL_TABLET | Freq: Every day | ORAL | 0 refills | Status: DC

## 2022-03-17 NOTE — Telephone Encounter (Signed)
PT calls today in regards to a refill on their levothyroxine (SYNTHROID) 25 MCG tablet . PT was informed by their pharmacy that they would need to be seen first prior to this RX being refilled. I was able to get PT scheduled with Dr.Jones for 4:00 next Monday.  PT only has enough of the RX for today and was wanting to know if they could get a partial in or if they would be allowed to come in prior to the appointment to run labs so that they could get the RX? (They also stated that if Dr.Jones felt it was okay for them to be off the medication for a couple of days that they would just wait till Monday).  CB if needed: [phone]

## 2022-03-24 ENCOUNTER — Ambulatory Visit (INDEPENDENT_AMBULATORY_CARE_PROVIDER_SITE_OTHER): Payer: PPO | Admitting: Internal Medicine

## 2022-03-24 ENCOUNTER — Encounter: Payer: Self-pay | Admitting: Internal Medicine

## 2022-03-24 VITALS — BP 138/72 | HR 63 | Temp 98.0°F | Ht 68.0 in | Wt 141.0 lb

## 2022-03-24 DIAGNOSIS — E039 Hypothyroidism, unspecified: Secondary | ICD-10-CM | POA: Diagnosis not present

## 2022-03-24 DIAGNOSIS — E785 Hyperlipidemia, unspecified: Secondary | ICD-10-CM | POA: Diagnosis not present

## 2022-03-24 DIAGNOSIS — Z0001 Encounter for general adult medical examination with abnormal findings: Secondary | ICD-10-CM | POA: Insufficient documentation

## 2022-03-24 DIAGNOSIS — Z0000 Encounter for general adult medical examination without abnormal findings: Secondary | ICD-10-CM | POA: Diagnosis not present

## 2022-03-24 DIAGNOSIS — E2839 Other primary ovarian failure: Secondary | ICD-10-CM | POA: Diagnosis not present

## 2022-03-24 NOTE — Progress Notes (Unsigned)
Subjective:  Patient ID: Susan Davila, female    DOB: 28-Jul-1950  Age: 71 y.o. MRN: [id number]  CC: Annual Exam, Hyperlipidemia, and Hypothyroidism   HPI Susan Davila presents for a CPX and f/up -   She walks for greater than 20 minutes a day and works out on a [employer].  Her endurance is good.  She denies chest pain, shortness of breath, diaphoresis, or edema.  Outpatient Medications Prior to Visit  Medication Sig Dispense Refill   amoxicillin (AMOXIL) 500 MG tablet Take 2000 mg (#4 tablets) 60 minutes prior to dental procedure. 8 tablet 0   FOLIC ACID PO Take 1 capsule by mouth daily.     ibuprofen (ADVIL) 200 MG tablet Take 600-800 mg by mouth 2 (two) times daily as needed for moderate pain.     vitamin B-12 (CYANOCOBALAMIN) 1000 MCG tablet Take 1,000 mcg by mouth daily.     levothyroxine (SYNTHROID) 25 MCG tablet Take 1 tablet (25 mcg total) by mouth daily before breakfast. 30 tablet 0   No facility-administered medications prior to visit.    ROS Review of Systems  Constitutional: Negative.  Negative for chills, diaphoresis, fatigue and fever.  HENT: Negative.    Respiratory: Negative.  Negative for cough, chest tightness, shortness of breath and wheezing.   Cardiovascular:  Negative for chest pain, palpitations and leg swelling.  Gastrointestinal:  Positive for constipation. Negative for abdominal pain, diarrhea, nausea and vomiting.  Endocrine: Positive for cold intolerance. Negative for heat intolerance.  Genitourinary: Negative.  Negative for difficulty urinating.  Musculoskeletal: Negative.  Negative for arthralgias and myalgias.  Skin: Negative.   Allergic/Immunologic: Negative.   Neurological: Negative.  Negative for dizziness and weakness.  Hematological:  Negative for adenopathy. Does not bruise/bleed easily.  Psychiatric/Behavioral: Negative.      Objective:  BP 138/72 (BP Location: Left Arm, Patient Position: Sitting, Cuff Size: Large)   Pulse 63    Temp 98 F (36.7 C) (Oral)   Ht '5\' 8"'$  (1.727 m)   Wt 141 lb (64 kg)   SpO2 98%   BMI 21.44 kg/m   BP Readings from Last 3 Encounters:  03/24/22 138/72  03/14/22 (!) 168/87  10/07/21 (!) 137/93    Wt Readings from Last 3 Encounters:  03/24/22 141 lb (64 kg)  07/24/21 145 lb (65.8 kg)  03/05/21 153 lb (69.4 kg)    Physical Exam Vitals reviewed.  HENT:     Nose: Nose normal.     Mouth/Throat:     Mouth: Mucous membranes are moist.  Eyes:     General: No scleral icterus.    Conjunctiva/sclera: Conjunctivae normal.  Cardiovascular:     Rate and Rhythm: Normal rate and regular rhythm.     Heart sounds: No murmur heard. Pulmonary:     Effort: Pulmonary effort is normal.     Breath sounds: No stridor. No wheezing, rhonchi or rales.  Abdominal:     General: Abdomen is flat.     Palpations: There is no mass.     Tenderness: There is no abdominal tenderness. There is no guarding.     Hernia: No hernia is present.  Musculoskeletal:        General: Normal range of motion.     Cervical back: Neck supple.     Right lower leg: No edema.     Left lower leg: No edema.  Lymphadenopathy:     Cervical: No cervical adenopathy.  Skin:    General: Skin is warm  and dry.     Findings: No rash.  Neurological:     General: No focal deficit present.     Mental Status: She is alert.  Psychiatric:        Mood and Affect: Mood normal.        Behavior: Behavior normal.     Lab Results  Component Value Date   WBC 5.7 03/24/2022   HGB 14.2 03/24/2022   HCT 41.9 03/24/2022   PLT 267.0 03/24/2022   GLUCOSE 89 03/24/2022   CHOL 164 03/24/2022   TRIG 52.0 03/24/2022   HDL 82.20 03/24/2022   LDLCALC 72 03/24/2022   ALT 17 03/24/2022   AST 16 03/24/2022   NA 141 03/24/2022   K 4.5 03/24/2022   CL 103 03/24/2022   CREATININE 0.64 03/24/2022   BUN 20 03/24/2022   CO2 32 03/24/2022   TSH 1.06 03/24/2022    No results found.  Assessment & Plan:   Susan Davila was seen today for annual  exam, hyperlipidemia and hypothyroidism.  Diagnoses and all orders for this visit:  Acquired hypothyroidism- She is euthyroid.  Will continue the current T4 dose. -     CBC with Differential/Platelet; Future -     Basic metabolic panel; Future -     TSH; Future -     Hepatic function panel; Future -     Hepatic function panel -     TSH -     Basic metabolic panel -     CBC with Differential/Platelet -     levothyroxine (SYNTHROID) 25 MCG tablet; Take 1 tablet (25 mcg total) by mouth daily before breakfast.  Hyperlipidemia LDL goal <130- Statin is not indicated. -     Lipid panel; Future -     Basic metabolic panel; Future -     TSH; Future -     Hepatic function panel; Future -     Hepatic function panel -     TSH -     Basic metabolic panel -     Lipid panel  Encounter for general adult medical examination with abnormal findings- Exam completed, labs reviewed, she refused all vaccines, cancer screenings are up-to-date, patient education was given.  Estrogen deficiency -     DG Bone Density; Future   I am having Susan Davila maintain her ibuprofen, FOLIC ACID PO, cyanocobalamin, amoxicillin, and levothyroxine.  Meds ordered this encounter  Medications   levothyroxine (SYNTHROID) 25 MCG tablet    Sig: Take 1 tablet (25 mcg total) by mouth daily before breakfast.    Dispense:  90 tablet    Refill:  1     Follow-up: Return in about 6 months (around 09/23/2022).  Scarlette Calico, MD

## 2022-03-24 NOTE — Patient Instructions (Signed)

## 2022-03-25 DIAGNOSIS — M9902 Segmental and somatic dysfunction of thoracic region: Secondary | ICD-10-CM | POA: Diagnosis not present

## 2022-03-25 DIAGNOSIS — M9903 Segmental and somatic dysfunction of lumbar region: Secondary | ICD-10-CM | POA: Diagnosis not present

## 2022-03-25 DIAGNOSIS — M9901 Segmental and somatic dysfunction of cervical region: Secondary | ICD-10-CM | POA: Diagnosis not present

## 2022-03-25 DIAGNOSIS — M9904 Segmental and somatic dysfunction of sacral region: Secondary | ICD-10-CM | POA: Diagnosis not present

## 2022-03-25 LAB — HEPATIC FUNCTION PANEL
ALT: 17 U/L (ref 0–35)
AST: 16 U/L (ref 0–37)
Albumin: 4.3 g/dL (ref 3.5–5.2)
Alkaline Phosphatase: 63 U/L (ref 39–117)
Bilirubin, Direct: 0.1 mg/dL (ref 0.0–0.3)
Total Bilirubin: 0.7 mg/dL (ref 0.2–1.2)
Total Protein: 6.4 g/dL (ref 6.0–8.3)

## 2022-03-25 LAB — CBC WITH DIFFERENTIAL/PLATELET
Basophils Absolute: 0.1 10*3/uL (ref 0.0–0.1)
Basophils Relative: 1.1 % (ref 0.0–3.0)
Eosinophils Absolute: 0.7 10*3/uL (ref 0.0–0.7)
Eosinophils Relative: 12 % — ABNORMAL HIGH (ref 0.0–5.0)
HCT: 41.9 % (ref 36.0–46.0)
Hemoglobin: 14.2 g/dL (ref 12.0–15.0)
Lymphocytes Relative: 32.9 % (ref 12.0–46.0)
Lymphs Abs: 1.9 10*3/uL (ref 0.7–4.0)
MCHC: 33.9 g/dL (ref 30.0–36.0)
MCV: 94.4 fl (ref 78.0–100.0)
Monocytes Absolute: 0.4 10*3/uL (ref 0.1–1.0)
Monocytes Relative: 7.7 % (ref 3.0–12.0)
Neutro Abs: 2.6 10*3/uL (ref 1.4–7.7)
Neutrophils Relative %: 46.3 % (ref 43.0–77.0)
Platelets: 267 10*3/uL (ref 150.0–400.0)
RBC: 4.44 Mil/uL (ref 3.87–5.11)
RDW: 13.2 % (ref 11.5–15.5)
WBC: 5.7 10*3/uL (ref 4.0–10.5)

## 2022-03-25 LAB — TSH: TSH: 1.06 u[IU]/mL (ref 0.35–5.50)

## 2022-03-25 LAB — LIPID PANEL
Cholesterol: 164 mg/dL (ref 0–200)
HDL: 82.2 mg/dL (ref >39.00)
LDL Cholesterol: 72 mg/dL (ref 0–99)
NonHDL: 82.27
Total CHOL/HDL Ratio: 2
Triglycerides: 52 mg/dL (ref 0.0–149.0)
VLDL: 10.4 mg/dL (ref 0.0–40.0)

## 2022-03-25 LAB — BASIC METABOLIC PANEL
BUN: 20 mg/dL (ref 6–23)
CO2: 32 mEq/L (ref 19–32)
Calcium: 9.1 mg/dL (ref 8.4–10.5)
Chloride: 103 mEq/L (ref 96–112)
Creatinine, Ser: 0.64 mg/dL (ref 0.40–1.20)
GFR: 89.79 mL/min (ref >60.00)
Glucose, Bld: 89 mg/dL (ref 70–99)
Potassium: 4.5 mEq/L (ref 3.5–5.1)
Sodium: 141 mEq/L (ref 135–145)

## 2022-03-26 DIAGNOSIS — M9901 Segmental and somatic dysfunction of cervical region: Secondary | ICD-10-CM | POA: Diagnosis not present

## 2022-03-26 DIAGNOSIS — M9904 Segmental and somatic dysfunction of sacral region: Secondary | ICD-10-CM | POA: Diagnosis not present

## 2022-03-26 DIAGNOSIS — M9903 Segmental and somatic dysfunction of lumbar region: Secondary | ICD-10-CM | POA: Diagnosis not present

## 2022-03-26 DIAGNOSIS — M9902 Segmental and somatic dysfunction of thoracic region: Secondary | ICD-10-CM | POA: Diagnosis not present

## 2022-03-26 MED ORDER — LEVOTHYROXINE SODIUM 25 MCG PO TABS: 25.0000 ug | ORAL_TABLET | Freq: Every day | ORAL | 1 refills | Status: DC

## 2022-03-27 DIAGNOSIS — E2839 Other primary ovarian failure: Secondary | ICD-10-CM | POA: Insufficient documentation

## 2022-04-28 ENCOUNTER — Other Ambulatory Visit: Payer: Self-pay | Admitting: Internal Medicine

## 2022-04-28 DIAGNOSIS — E039 Hypothyroidism, unspecified: Secondary | ICD-10-CM

## 2022-04-30 ENCOUNTER — Ambulatory Visit (INDEPENDENT_AMBULATORY_CARE_PROVIDER_SITE_OTHER): Payer: PPO | Admitting: Orthopedic Surgery

## 2022-04-30 ENCOUNTER — Ambulatory Visit (INDEPENDENT_AMBULATORY_CARE_PROVIDER_SITE_OTHER): Payer: PPO

## 2022-04-30 ENCOUNTER — Encounter: Payer: Self-pay | Admitting: Orthopedic Surgery

## 2022-04-30 DIAGNOSIS — M25551 Pain in right hip: Secondary | ICD-10-CM | POA: Diagnosis not present

## 2022-04-30 NOTE — Progress Notes (Signed)
Office Visit Note   Patient: Susan Davila           Date of Birth: 1950/09/13           MRN: [id number] Visit Date: 04/30/2022 Requested by: Janith Lima, MD [address] [place],  [place] [address] PCP: Janith Lima, MD  Subjective: Chief Complaint  Patient presents with   Right Hip - Pain    HPI: Susan Davila is a 72 y.o. female who presents to the office reporting right trochanteric pain as well as mild left shoulder pain.  She underwent left shoulder reverse replacement last year.  She reports occasional popping in the left shoulder but overall her pain is significantly better.  Still has some residual stiffness in the shoulder.  She reports right hip focal lateral pain over the trochanteric region which extends down to the knee but not below the knee except occasionally into the foot.  Denies much in the way of back pain.  It is a burning type pain.  She is okay with walking and doing exercises.  She does see the chiropractor for her back.  Reports mostly anterior soreness.              ROS: All systems reviewed are negative as they relate to the chief complaint within the history of present illness.  Patient denies fevers or chills.  Assessment & Plan: Visit Diagnoses: No diagnosis found.  Plan: Impression is right hip trochanteric bursitis with no real signs or symptoms of joint mediated pain in the hip.  Discussed trochanteric injection versus meralgia paresthetica injection today.  The shoulder has reasonably pain-free range of motion but remain remain slightly stiff.  I did reproduce some of the weightlifting motions that she says because he is some of her symptoms.  Recommended that she not do clean and jerks more than 20 pounds.  If her symptoms persist I would favor radiographic evaluation of the shoulder at the next clinic visit but on exam today everything moves reasonably well in the shoulder both actively and passively.  Follow-Up Instructions: No follow-ups on  file.   Orders:  No orders of the defined types were placed in this encounter.  No orders of the defined types were placed in this encounter.     Procedures: No procedures performed   Clinical Data: No additional findings.  Objective: Vital Signs: There were no vitals taken for this visit.  Physical Exam:  Constitutional: Patient appears well-developed HEENT:  Head: Normocephalic Eyes:EOM are normal Neck: Normal range of motion Cardiovascular: Normal rate Pulmonary/chest: Effort normal Neurologic: Patient is alert Skin: Skin is warm Psychiatric: Patient has normal mood and affect  Ortho Exam: Ortho exam demonstrates mild trochanteric tenderness on the right compared to the left.  She has very good abduction adduction and hip flexion strength on the right with no groin pain with internal or external rotation of either hip joint.  No nerve root tension signs.  No real tenderness over the right anterior superior iliac crest.  No discrete paresthesias in the lateral femoral cutaneous nerve distribution.  Left shoulder demonstrates occasional popping which feels more scapulothoracic in nature.  Deltoid is functional.  She has about 20 to 25 degrees of external rotation and forward flexion above 90 degrees.  Specialty Comments:  No specialty comments available.  Imaging: No results found.   PMFS History: Patient Active Problem List   Diagnosis Date Noted   Estrogen deficiency 03/27/2022   Encounter for general  adult medical examination with abnormal findings 03/24/2022   Visit for screening mammogram 11/12/2020   Routine general medical examination at a health care facility 07/13/2019   Acquired hypothyroidism 07/13/2019   Hyperlipidemia LDL goal <130 07/13/2019   PULMONARY NODULE 01/31/2010   Past Medical History:  Diagnosis Date   Thyroid disease     Family History  Problem Relation Age of Onset   Dementia Mother 23   Alcohol abuse Father 74    Past Surgical  History:  Procedure Laterality Date   BARTHOLIN CYST MARSUPIALIZATION     EYE SURGERY     LIPOSUCTION     TOTAL SHOULDER ARTHROPLASTY Left 03/05/2021   Procedure: LEFT REVERSE TOTAL SHOULDER ARTHROPLASTY;  Surgeon: Meredith Pel, MD;  Location: [facility];  Service: Orthopedics;  Laterality: Left;   wisdom teeth Bilateral    Social History   Occupational History   Not on file  Tobacco Use   Smoking status: Former    Types: Cigarettes    Quit date: 09/15/1978    Years since quitting: 43.6    Passive exposure: Never   Smokeless tobacco: Never  Vaping Use   Vaping Use: Never used  Substance and Sexual Activity   Alcohol use: Yes    Alcohol/week: 20.0 standard drinks of alcohol    Types: 20 Glasses of wine per week   Drug use: Never   Sexual activity: Yes    Partners: Male    Birth control/protection: None

## 2022-05-06 DIAGNOSIS — M9901 Segmental and somatic dysfunction of cervical region: Secondary | ICD-10-CM | POA: Diagnosis not present

## 2022-05-06 DIAGNOSIS — M9902 Segmental and somatic dysfunction of thoracic region: Secondary | ICD-10-CM | POA: Diagnosis not present

## 2022-05-06 DIAGNOSIS — M9903 Segmental and somatic dysfunction of lumbar region: Secondary | ICD-10-CM | POA: Diagnosis not present

## 2022-05-06 DIAGNOSIS — M9904 Segmental and somatic dysfunction of sacral region: Secondary | ICD-10-CM | POA: Diagnosis not present

## 2022-05-12 DIAGNOSIS — M9903 Segmental and somatic dysfunction of lumbar region: Secondary | ICD-10-CM | POA: Diagnosis not present

## 2022-05-12 DIAGNOSIS — M9902 Segmental and somatic dysfunction of thoracic region: Secondary | ICD-10-CM | POA: Diagnosis not present

## 2022-05-12 DIAGNOSIS — M9904 Segmental and somatic dysfunction of sacral region: Secondary | ICD-10-CM | POA: Diagnosis not present

## 2022-05-12 DIAGNOSIS — M9901 Segmental and somatic dysfunction of cervical region: Secondary | ICD-10-CM | POA: Diagnosis not present

## 2022-05-14 ENCOUNTER — Telehealth: Payer: Self-pay | Admitting: Orthopedic Surgery

## 2022-05-14 NOTE — Telephone Encounter (Signed)
Patient wanted to know if she could have and x-ray to check the shoulder is ok. Not having issues but was just wondering..please advise

## 2022-05-28 ENCOUNTER — Ambulatory Visit (INDEPENDENT_AMBULATORY_CARE_PROVIDER_SITE_OTHER): Payer: PPO | Admitting: Orthopedic Surgery

## 2022-05-28 ENCOUNTER — Ambulatory Visit (INDEPENDENT_AMBULATORY_CARE_PROVIDER_SITE_OTHER): Payer: PPO

## 2022-05-28 DIAGNOSIS — Z96612 Presence of left artificial shoulder joint: Secondary | ICD-10-CM

## 2022-05-30 ENCOUNTER — Encounter: Payer: Self-pay | Admitting: Orthopedic Surgery

## 2022-05-30 NOTE — Progress Notes (Signed)
Office Visit Note   Patient: Susan Davila           Date of Birth: 1951/03/07           MRN: [id number] Visit Date: 05/28/2022 Requested by: Janith Lima, MD [address] [place],  [place] [address] PCP: Janith Lima, MD  Subjective: Chief Complaint  Patient presents with   Other    Left arm pain    HPI: Susan Davila is a 72 y.o. female who presents to the office reporting left shoulder pain.  She underwent reverse shoulder replacement 03/05/2021.  She is very active person.  Was doing some dead lifts and developed some pain in the shoulder.  She has not really done any activity in 3 weeks.  Pain does not wake her from sleep at night.  She reports a clicking sensation primarily anteriorly around the biceps region.  She is able to sleep on that side.  Takes ibuprofen occasionally..                ROS: All systems reviewed are negative as they relate to the chief complaint within the history of present illness.  Patient denies fevers or chills.  Assessment & Plan: Visit Diagnoses:  1. History of arthroplasty of left shoulder     Plan: Impression is left shoulder pain with primarily anterior pain around the biceps.  Ultrasound shows the biceps tendon to be apparently well anchored.  No Popeye deformity in the arm.  Left shoulder has reasonable range of motion with small amount of scapulothoracic crepitus but no demonstrable clicking or any type of instability in the shoulder.  The shoulder itself is slightly on the stiff side and external rotation.  Radiographs unremarkable in terms of component position.  No lucency around the glenoid baseplate.  No definite lucent lines around the proximal humeral prosthesis.  Plan at this time is for observation and slow resumption of activities.  If symptoms persist we could consider further options.  Does not really look infected or unstable.  I think it is okay at this time for her to resume her working out activities but in a modified  fashion with less weight and avoiding certain types of stressful shoulder exercises.  Follow-Up Instructions: No follow-ups on file.   Orders:  Orders Placed This Encounter  Procedures   XR Shoulder Left   No orders of the defined types were placed in this encounter.     Procedures: No procedures performed   Clinical Data: No additional findings.  Objective: Vital Signs: There were no vitals taken for this visit.  Physical Exam:  Constitutional: Patient appears well-developed HEENT:  Head: Normocephalic Eyes:EOM are normal Neck: Normal range of motion Cardiovascular: Normal rate Pulmonary/chest: Effort normal Neurologic: Patient is alert Skin: Skin is warm Psychiatric: Patient has normal mood and affect  Ortho Exam: Ortho exam demonstrates functional deltoid.  External rotation is about 20 degrees.  Subscap and external rotation strength intact.  She does have forward flexion above 90.  No Popeye deformity.  Incision intact.  No warmth or lymphadenopathy around the shoulder girdle region.  No crepitus with internal and external rotation of that left arm.  Ultrasound exam demonstrates no fluid collections around the shoulder and intact pec tendon and biceps.  Specialty Comments:  No specialty comments available.  Imaging: No results found.   PMFS History: Patient Active Problem List   Diagnosis Date Noted   Estrogen deficiency 03/27/2022   Encounter for general adult  medical examination with abnormal findings 03/24/2022   Visit for screening mammogram 11/12/2020   Routine general medical examination at a health care facility 07/13/2019   Acquired hypothyroidism 07/13/2019   Hyperlipidemia LDL goal <130 07/13/2019   PULMONARY NODULE 01/31/2010   Past Medical History:  Diagnosis Date   Thyroid disease     Family History  Problem Relation Age of Onset   Dementia Mother 61   Alcohol abuse Father 39    Past Surgical History:  Procedure Laterality Date    BARTHOLIN CYST MARSUPIALIZATION     EYE SURGERY     LIPOSUCTION     TOTAL SHOULDER ARTHROPLASTY Left 03/05/2021   Procedure: LEFT REVERSE TOTAL SHOULDER ARTHROPLASTY;  Surgeon: Meredith Pel, MD;  Location: [facility];  Service: Orthopedics;  Laterality: Left;   wisdom teeth Bilateral    Social History   Occupational History   Not on file  Tobacco Use   Smoking status: Former    Types: Cigarettes    Quit date: 09/15/1978    Years since quitting: 43.7    Passive exposure: Never   Smokeless tobacco: Never  Vaping Use   Vaping Use: Never used  Substance and Sexual Activity   Alcohol use: Yes    Alcohol/week: 20.0 standard drinks of alcohol    Types: 20 Glasses of wine per week   Drug use: Never   Sexual activity: Yes    Partners: Male    Birth control/protection: None

## 2022-06-12 ENCOUNTER — Telehealth: Payer: Self-pay

## 2022-06-12 NOTE — Telephone Encounter (Signed)
Called patient to schedule Medicare Annual Wellness Visit (AWV). No voicemail available to leave a message.  Last date of AWV: NO HX of AWV, eligible 02/19/18  Please schedule an appointment at any time with [facility].  Norton Blizzard, [place] ([facility])  [facility] [phone]

## 2022-07-17 ENCOUNTER — Telehealth: Payer: Self-pay | Admitting: Orthopedic Surgery

## 2022-07-17 NOTE — Telephone Encounter (Signed)
Pt states she had surgery last week and something is going on with her arm and asking for a call back from [facility] phone number is [phone].

## 2022-07-17 NOTE — Telephone Encounter (Signed)
IC scheduled to see Susan Davila

## 2022-07-18 ENCOUNTER — Encounter: Payer: Self-pay | Admitting: Internal Medicine

## 2022-07-22 ENCOUNTER — Encounter: Payer: Self-pay | Admitting: Orthopedic Surgery

## 2022-07-22 ENCOUNTER — Ambulatory Visit (INDEPENDENT_AMBULATORY_CARE_PROVIDER_SITE_OTHER): Payer: PPO | Admitting: Orthopedic Surgery

## 2022-07-22 ENCOUNTER — Other Ambulatory Visit (INDEPENDENT_AMBULATORY_CARE_PROVIDER_SITE_OTHER): Payer: PPO

## 2022-07-22 DIAGNOSIS — Z96612 Presence of left artificial shoulder joint: Secondary | ICD-10-CM

## 2022-07-22 NOTE — Progress Notes (Signed)
Office Visit Note   Patient: Susan Davila           Date of Birth: 01-Dec-1950           MRN: [id number] Visit Date: 07/22/2022 Requested by: Janith Lima, MD [address] [place],  [place] [address] PCP: Janith Lima, MD  Subjective: Chief Complaint  Patient presents with   Left Shoulder - Pain    HPI: Susan Davila is a 72 y.o. female who presents to the office reporting anterior left shoulder pain.  Reported 1 episode of increased pain with tingling with decreased range of motion in that left shoulder.  She actually used ice heat and ibuprofen and is feeling much better.  Overall she is about 95% better than when she called to make the appointment.  Localizes the pain near the distal aspect of the incision around the anterior portion of the deltoid attachment.  She has been doing some working out but dropped her weights by about 10 or 15 pounds.  Denies any neck pain..                ROS: All systems reviewed are negative as they relate to the chief complaint within the history of present illness.  Patient denies fevers or chills.  Assessment & Plan: Visit Diagnoses:  1. History of arthroplasty of left shoulder     Plan: Impression is episodic pain in the left reverse shoulder replacement.  Overall she is functional with activities of daily living.  Some of the weight lifting may be aggravating the shoulder to some degree.  Biceps is nontender today and has good strength.  Pec tendon also nontender.  Hard to say exactly what it is giving her the 6 episodes of pain.  Structurally with passive motion strength testing and radiographs of the shoulder looks to be intact.  Recommend 69-month follow-up with decreasing the weight on the biceps resistance training a little bit more to try to alleviate the biceps if that is a pain generator.  Follow-Up Instructions: No follow-ups on file.   Orders:  Orders Placed This Encounter  Procedures   XR Shoulder Left   No orders of the  defined types were placed in this encounter.     Procedures: No procedures performed   Clinical Data: No additional findings.  Objective: Vital Signs: There were no vitals taken for this visit.  Physical Exam:  Constitutional: Patient appears well-developed HEENT:  Head: Normocephalic Eyes:EOM are normal Neck: Normal range of motion Cardiovascular: Normal rate Pulmonary/chest: Effort normal Neurologic: Patient is alert Skin: Skin is warm Psychiatric: Patient has normal mood and affect  Ortho Exam: Ortho exam demonstrates functional deltoid.  No tenderness on the left acromion.  Elbow range of motion is full.  Biceps has normal contour compared to the right-hand side.  She has smooth range of motion.  External rotation is about 30 degrees abduction 90 and forward flexion about 140.  Radial pulse intact.  No paresthesias in the arm.  Specialty Comments:  No specialty comments available.  Imaging: XR Shoulder Left  Result Date: 07/22/2022 AP axillary outlet radiographs left shoulder reviewed.  Reverse shoulder prosthesis in the expected alignment and position.  No complicating features.  No lucencies around the base plate or screws.  Humeral component appears well-fixed.  No evidence of acromial fracture.    PMFS History: Patient Active Problem List   Diagnosis Date Noted   Estrogen deficiency 03/27/2022   Encounter for general adult medical  examination with abnormal findings 03/24/2022   Visit for screening mammogram 11/12/2020   Routine general medical examination at a health care facility 07/13/2019   Acquired hypothyroidism 07/13/2019   Hyperlipidemia LDL goal <130 07/13/2019   PULMONARY NODULE 01/31/2010   Past Medical History:  Diagnosis Date   Thyroid disease     Family History  Problem Relation Age of Onset   Dementia Mother 24   Alcohol abuse Father 33    Past Surgical History:  Procedure Laterality Date   BARTHOLIN CYST MARSUPIALIZATION     EYE  SURGERY     LIPOSUCTION     TOTAL SHOULDER ARTHROPLASTY Left 03/05/2021   Procedure: LEFT REVERSE TOTAL SHOULDER ARTHROPLASTY;  Surgeon: Meredith Pel, MD;  Location: [facility];  Service: Orthopedics;  Laterality: Left;   wisdom teeth Bilateral    Social History   Occupational History   Not on file  Tobacco Use   Smoking status: Former    Types: Cigarettes    Quit date: 09/15/1978    Years since quitting: 43.8    Passive exposure: Never   Smokeless tobacco: Never  Vaping Use   Vaping Use: Never used  Substance and Sexual Activity   Alcohol use: Yes    Alcohol/week: 20.0 standard drinks of alcohol    Types: 20 Glasses of wine per week   Drug use: Never   Sexual activity: Yes    Partners: Male    Birth control/protection: None

## 2022-07-28 ENCOUNTER — Encounter: Payer: Self-pay | Admitting: Internal Medicine

## 2022-07-28 ENCOUNTER — Ambulatory Visit (INDEPENDENT_AMBULATORY_CARE_PROVIDER_SITE_OTHER): Payer: PPO | Admitting: Internal Medicine

## 2022-07-28 VITALS — BP 136/84 | HR 83 | Temp 97.9°F | Ht 68.0 in | Wt 138.0 lb

## 2022-07-28 DIAGNOSIS — I10 Essential (primary) hypertension: Secondary | ICD-10-CM | POA: Diagnosis not present

## 2022-07-28 DIAGNOSIS — E2839 Other primary ovarian failure: Secondary | ICD-10-CM

## 2022-07-28 DIAGNOSIS — E039 Hypothyroidism, unspecified: Secondary | ICD-10-CM | POA: Diagnosis not present

## 2022-07-28 HISTORY — DX: Essential (primary) hypertension: I10

## 2022-07-28 LAB — TSH: TSH: 1.55 u[IU]/mL (ref 0.35–5.50)

## 2022-07-28 IMAGING — DX DG HAND COMPLETE 3+V*R*
3 series · 3 of 3 positions shown · non-contrast
Comparison: None.

CLINICAL DATA: Dog bite to the dorsal hand.

EXAM:
RIGHT HAND - COMPLETE 3+ VIEW

[hand ap]
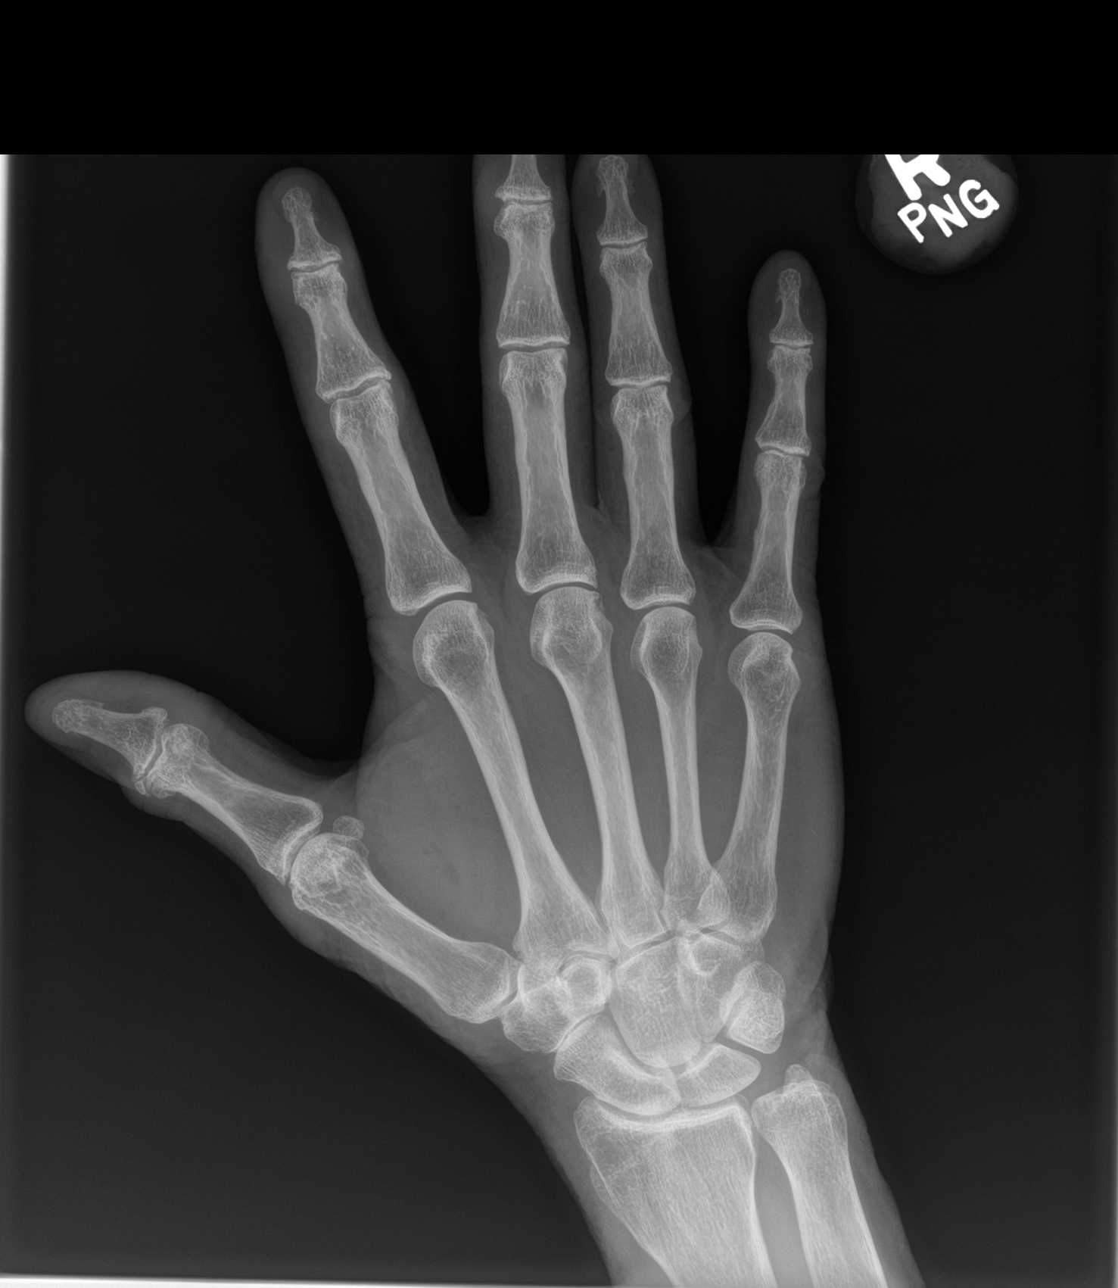

[hand obl]
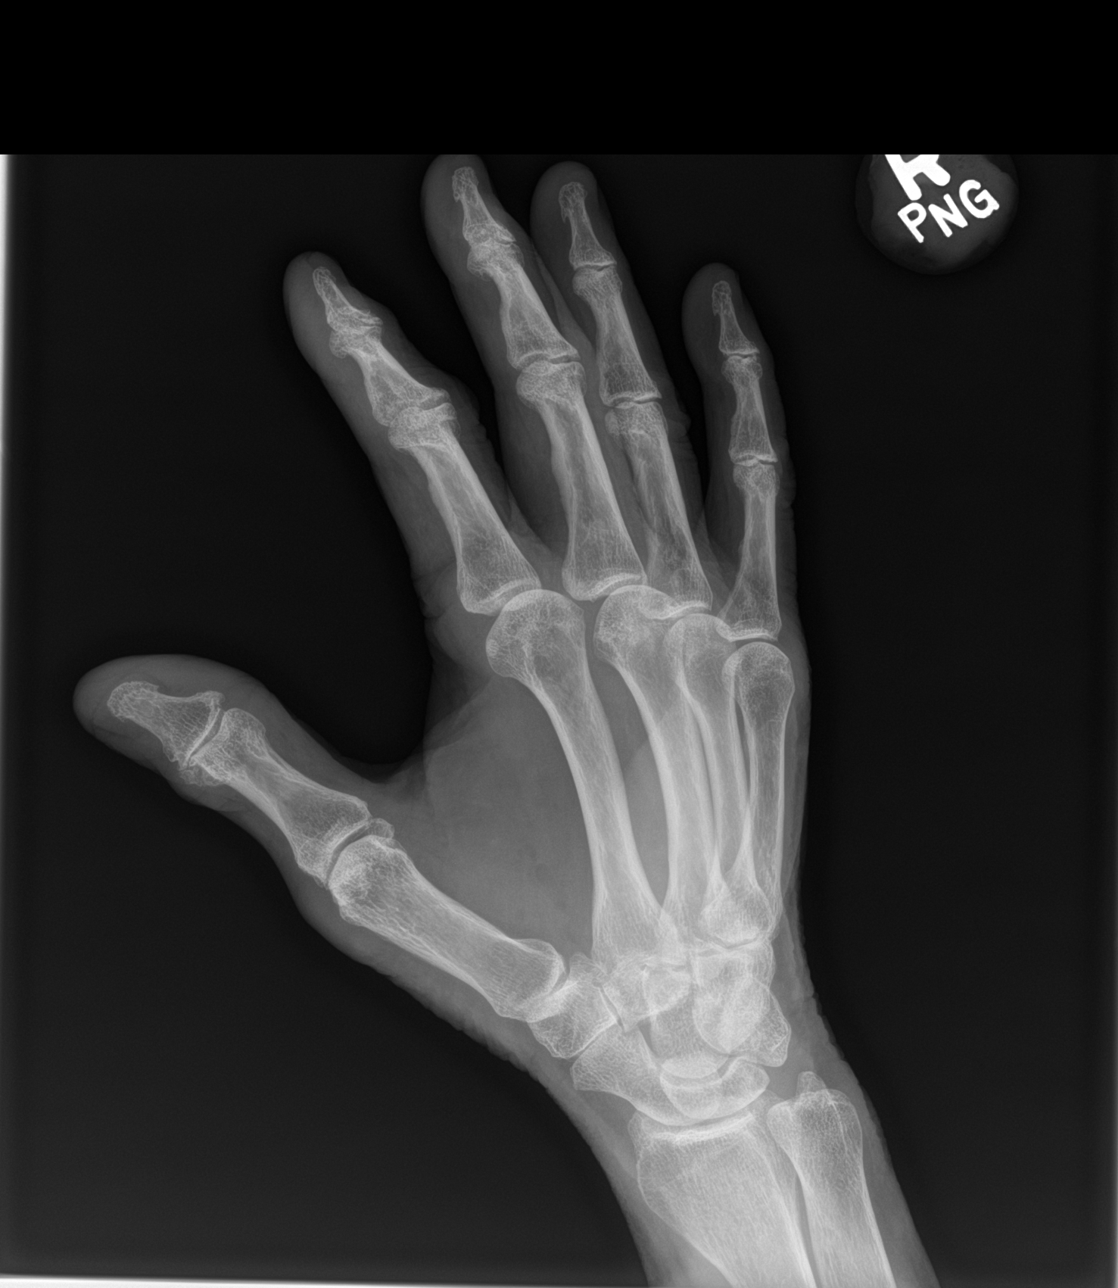

[hand lat]
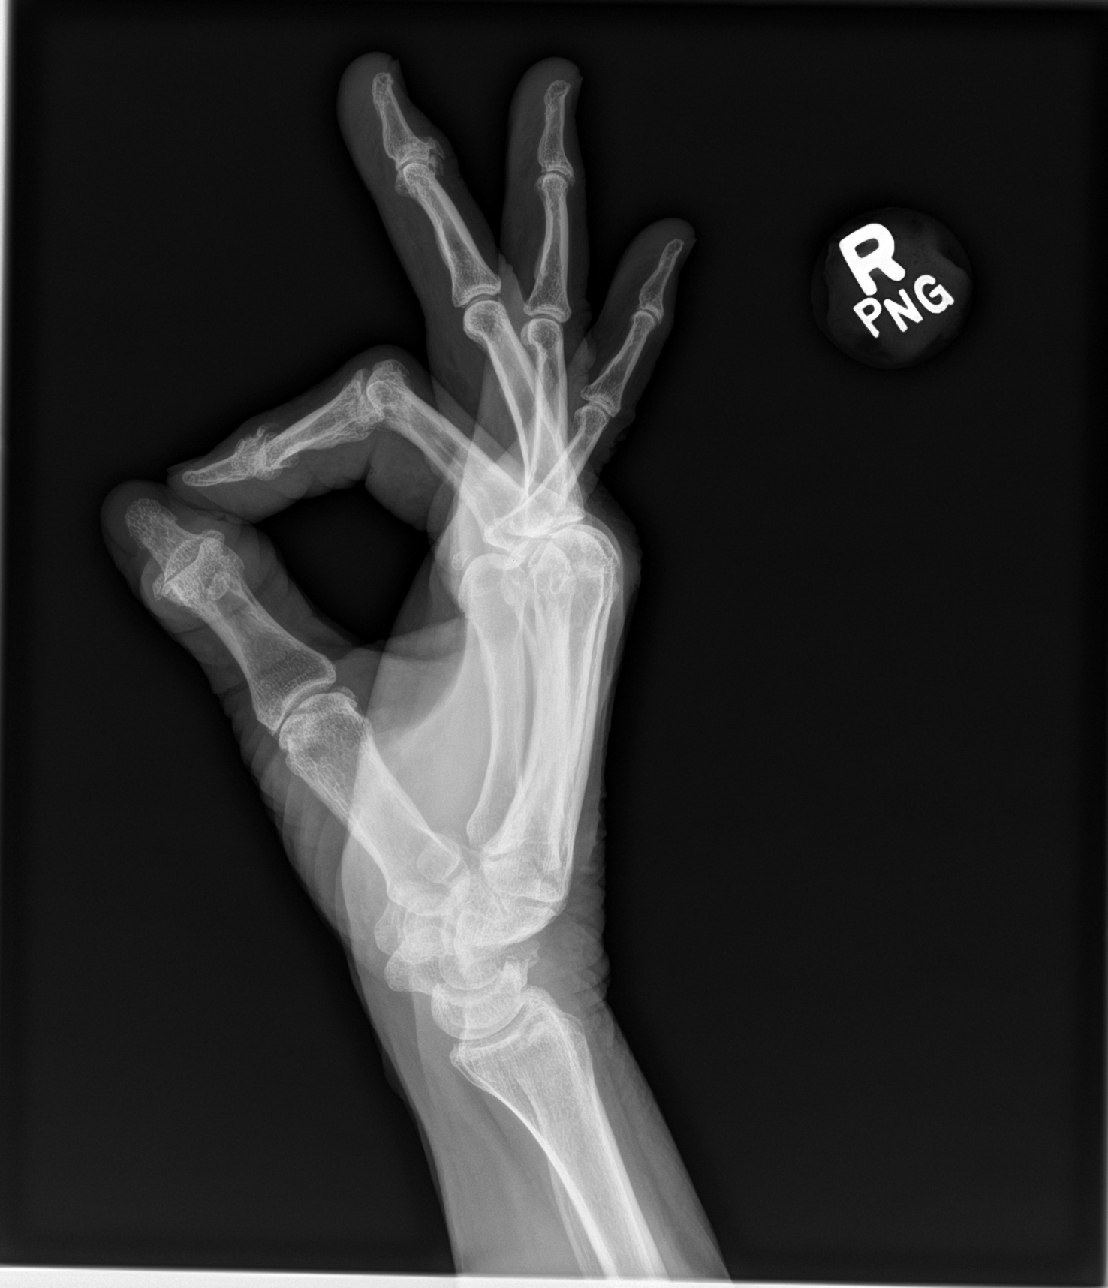

[3 of 3 positions shown; findings below may reference images not displayed]

FINDINGS: No acute fracture or dislocation. Moderate scaphotrapeziotrapezoid
joint space narrowing. Remaining joint spaces are relatively
preserved. Degenerative spurring of IP joints, most prominent the
second and third DIP joints. Bone mineralization is normal. Soft
tissues are unremarkable. No radiopaque foreign body identified.
IMPRESSION: 1. No acute osseous abnormality.

## 2022-07-28 MED ORDER — TORSEMIDE 5 MG PO TABS: 5.0000 mg | ORAL_TABLET | Freq: Every day | ORAL | 1 refills | Status: DC

## 2022-07-28 NOTE — Patient Instructions (Signed)
Edema  Edema is an abnormal buildup of fluids in the body tissues and under the skin. Swelling of the legs, feet, and ankles is a common symptom that becomes more likely as you get older. Swelling is also common in looser tissues, such as around the eyes. Pressing on the area may make a temporary dent in your skin (pitting edema). This fluid may also accumulate in your lungs (pulmonary edema). There are many possible causes of edema. Eating too much salt (sodium) and being on your feet or sitting for a long time can cause edema in your legs, feet, and ankles. Common causes of edema include: Certain medical conditions, such as heart failure, liver or kidney disease, and cancer. Weak leg blood vessels. An injury. Pregnancy. Medicines. Being obese. Low protein levels in the blood. Hot weather may make edema worse. Edema is usually painless. Your skin may look swollen or shiny. Follow these instructions at home: Medicines Take over-the-counter and prescription medicines only as told by your health care provider. Your health care provider may prescribe a medicine to help your body get rid of extra water (diuretic). Take this medicine if you are told to take it. Eating and drinking Eat a low-salt (low-sodium) diet to reduce fluid as told by your health care provider. Sometimes, eating less salt may reduce swelling. Depending on the cause of your swelling, you may need to limit how much fluid you drink (fluid restriction). General instructions Raise (elevate) the injured area above the level of your heart while you are sitting or lying down. Do not sit still or stand for long periods of time. Do not wear tight clothing. Do not wear garters on your upper legs. Exercise your legs to get your circulation going. This helps to move the fluid back into your blood vessels, and it may help the swelling go down. Wear compression stockings as told by your health care provider. These stockings help to prevent  blood clots and reduce swelling in your legs. It is important that these are the correct size. These stockings should be prescribed by your health care provider to prevent possible injuries. If elastic bandages or wraps are recommended, use them as told by your health care provider. Contact a health care provider if: Your edema does not get better with treatment. You have heart, liver, or kidney disease and have symptoms of edema. You have sudden and unexplained weight gain. Get help right away if: You develop shortness of breath or chest pain. You cannot breathe when you lie down. You develop pain, redness, or warmth in the swollen areas. You have heart, liver, or kidney disease and suddenly get edema. You have a fever and your symptoms suddenly get worse. These symptoms may be an emergency. Get help right away. Call 911. Do not wait to see if the symptoms will go away. Do not drive yourself to the hospital. Summary Edema is an abnormal buildup of fluids in the body tissues and under the skin. Eating too much salt (sodium)and being on your feet or sitting for a long time can cause edema in your legs, feet, and ankles. Raise (elevate) the injured area above the level of your heart while you are sitting or lying down. Follow your health care provider's instructions about diet and how much fluid you can drink. This information is not intended to replace advice given to you by your health care provider. Make sure you discuss any questions you have with your health care provider. Document Revised: 12/10/2020 Document   Reviewed: 12/10/2020 Elsevier Patient Education  2023 Elsevier Inc.  

## 2022-07-28 NOTE — Progress Notes (Unsigned)
Subjective:  Patient ID: Susan Davila, female    DOB: 1950-10-25  Age: 72 y.o. MRN: [id number]  CC: Hypertension and Hypothyroidism   HPI Susan Davila presents for f/up ---  She walks for 20 minutes/day and has good endurance but after standing all day at work she gets ankle edema. The edema resolves when she elevates her LE's. She denies diaphoresis, palpitations, or chest pain.  Outpatient Medications Prior to Visit  Medication Sig Dispense Refill   amoxicillin (AMOXIL) 500 MG tablet Take 2000 mg (#4 tablets) 60 minutes prior to dental procedure. 8 tablet 0   FOLIC ACID PO Take 1 capsule by mouth daily.     ibuprofen (ADVIL) 200 MG tablet Take 600-800 mg by mouth 2 (two) times daily as needed for moderate pain.     vitamin B-12 (CYANOCOBALAMIN) 1000 MCG tablet Take 1,000 mcg by mouth daily.     levothyroxine (SYNTHROID) 25 MCG tablet TAKE 1 TABLET(25 MCG) BY MOUTH DAILY BEFORE BREAKFAST 90 tablet 1   No facility-administered medications prior to visit.    ROS Review of Systems  Constitutional: Negative.  Negative for diaphoresis, fatigue and unexpected weight change.  HENT: Negative.    Eyes: Negative.   Respiratory:  Negative for cough, chest tightness, shortness of breath and wheezing.   Cardiovascular:  Positive for leg swelling. Negative for chest pain and palpitations.  Gastrointestinal:  Negative for abdominal pain, constipation, diarrhea, nausea and vomiting.  Endocrine: Negative.   Genitourinary: Negative.  Negative for difficulty urinating.  Musculoskeletal: Negative.  Negative for arthralgias and myalgias.  Skin: Negative.   Neurological:  Negative for dizziness and weakness.  Hematological:  Negative for adenopathy. Does not bruise/bleed easily.  Psychiatric/Behavioral: Negative.      Objective:  BP 136/84 (BP Location: Left Arm, Patient Position: Sitting, Cuff Size: Large)   Pulse 83   Temp 97.9 F (36.6 C) (Oral)   Ht 5\' 8"  (1.727 m)   Wt 138 lb (62.6  kg)   SpO2 98%   BMI 20.98 kg/m   BP Readings from Last 3 Encounters:  07/28/22 136/84  03/24/22 138/72  03/14/22 (!) 168/87    Wt Readings from Last 3 Encounters:  07/28/22 138 lb (62.6 kg)  03/24/22 141 lb (64 kg)  07/24/21 145 lb (65.8 kg)    Physical Exam Vitals reviewed.  Constitutional:      Appearance: Normal appearance.  HENT:     Nose: Nose normal.     Mouth/Throat:     Mouth: Mucous membranes are moist.  Eyes:     General: No scleral icterus.    Conjunctiva/sclera: Conjunctivae normal.  Cardiovascular:     Rate and Rhythm: Normal rate and regular rhythm.     Heart sounds: No murmur heard.    No friction rub. No gallop.  Pulmonary:     Effort: Pulmonary effort is normal.     Breath sounds: No stridor. No wheezing, rhonchi or rales.  Abdominal:     General: Abdomen is flat.     Palpations: There is no mass.     Tenderness: There is no abdominal tenderness. There is no guarding or rebound.     Hernia: No hernia is present.  Musculoskeletal:     Cervical back: Neck supple.     Right lower leg: No edema.     Left lower leg: No edema.  Lymphadenopathy:     Cervical: No cervical adenopathy.  Skin:    General: Skin is warm and dry.  Neurological:     General: No focal deficit present.     Mental Status: She is alert.  Psychiatric:        Mood and Affect: Mood normal.     Lab Results  Component Value Date   WBC 5.7 03/24/2022   HGB 14.2 03/24/2022   HCT 41.9 03/24/2022   PLT 267.0 03/24/2022   GLUCOSE 89 03/24/2022   CHOL 164 03/24/2022   TRIG 52.0 03/24/2022   HDL 82.20 03/24/2022   LDLCALC 72 03/24/2022   ALT 17 03/24/2022   AST 16 03/24/2022   NA 141 03/24/2022   K 4.5 03/24/2022   CL 103 03/24/2022   CREATININE 0.64 03/24/2022   BUN 20 03/24/2022   CO2 32 03/24/2022   TSH 1.55 07/28/2022    No results found.  Assessment & Plan:   Primary hypertension- There is no edema today. Will start a loop diuretic. -     Torsemide; Take 1  tablet (5 mg total) by mouth daily.  Dispense: 90 tablet; Refill: 1 -     TSH; Future  Acquired hypothyroidism- She is euthyroid. -     TSH; Future -     Levothyroxine Sodium; TAKE 1 TABLET(25 MCG) BY MOUTH DAILY BEFORE BREAKFAST  Dispense: 90 tablet; Refill: 1  Estrogen deficiency -     DG Bone Density; Future     Follow-up: Return in about 6 months (around 01/27/2023).  [name], MD

## 2022-07-29 ENCOUNTER — Ambulatory Visit (INDEPENDENT_AMBULATORY_CARE_PROVIDER_SITE_OTHER): Payer: PPO | Admitting: Podiatry

## 2022-07-29 ENCOUNTER — Encounter: Payer: Self-pay | Admitting: Podiatry

## 2022-07-29 DIAGNOSIS — M722 Plantar fascial fibromatosis: Secondary | ICD-10-CM | POA: Diagnosis not present

## 2022-07-29 MED ORDER — LEVOTHYROXINE SODIUM 25 MCG PO TABS: ORAL_TABLET | ORAL | 1 refills | Status: DC

## 2022-07-29 MED ORDER — TRIAMCINOLONE ACETONIDE 40 MG/ML IJ SUSP
20.0000 mg | Freq: Once | INTRAMUSCULAR | Status: AC
  Administered 2022-07-29: 20 mg

## 2022-07-29 NOTE — Progress Notes (Signed)
She presents today states that she is recently had a flareup of her Planter fasciitis of her left foot.  States that she thinks she needs a shot in his left heel.  She has been trying to take care of it herself but to no avail.  Objective: Vital signs are stable alert oriented x 3.  She has severe pain on palpation medial calcaneal tubercle of the left heel with some tenderness on palpation of the lateral aspect of the left heel and the fifth metatarsal base.  Most likely due to lateral compensatory syndrome.  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  Plan: I injected the medial aspect of the left heel at the point of maximal tenderness.  20 mg Kenalog 5 mg Marcaine once again discussed appropriate stretching exercise ice therapy shoe gear modifications.  Anti-inflammatories etc.  Follow-up with her on an as-needed basis

## 2022-08-01 ENCOUNTER — Encounter: Payer: Self-pay | Admitting: Orthopedic Surgery

## 2022-08-20 ENCOUNTER — Other Ambulatory Visit: Payer: Self-pay | Admitting: Surgical

## 2022-11-10 ENCOUNTER — Telehealth: Payer: Self-pay | Admitting: *Deleted

## 2022-11-10 NOTE — Telephone Encounter (Signed)
I attempted to contact patient by telephone but was unsuccessful. According to the patient's chart they are due for follow up in Oct with [facility]. I have left a HIPAA compliant message advising the patient to contact [facility] at [phone]. I will continue to follow up with the patient to make sure this appointment is scheduled.

## 2022-11-13 ENCOUNTER — Encounter (HOSPITAL_COMMUNITY): Payer: Self-pay | Admitting: *Deleted

## 2022-11-13 ENCOUNTER — Ambulatory Visit (HOSPITAL_COMMUNITY)
Admission: EM | Admit: 2022-11-13 | Discharge: 2022-11-13 | Disposition: A | Discharge status: Home / Self Care | Payer: PPO | Attending: Internal Medicine | Admitting: Internal Medicine

## 2022-11-13 ENCOUNTER — Encounter: Payer: Self-pay | Admitting: *Deleted

## 2022-11-13 DIAGNOSIS — W57XXXA Bitten or stung by nonvenomous insect and other nonvenomous arthropods, initial encounter: Secondary | ICD-10-CM | POA: Diagnosis not present

## 2022-11-13 DIAGNOSIS — L03115 Cellulitis of right lower limb: Secondary | ICD-10-CM | POA: Diagnosis not present

## 2022-11-13 DIAGNOSIS — N61 Mastitis without abscess: Secondary | ICD-10-CM | POA: Diagnosis not present

## 2022-11-13 DIAGNOSIS — L03113 Cellulitis of right upper limb: Secondary | ICD-10-CM

## 2022-11-13 MED ORDER — AMOXICILLIN-POT CLAVULANATE 875-125 MG PO TABS: 1.0000 | ORAL_TABLET | Freq: Two times a day (BID) | ORAL | 0 refills | Status: DC

## 2022-11-13 MED ORDER — DEXAMETHASONE SODIUM PHOSPHATE 10 MG/ML IJ SOLN
INTRAMUSCULAR | Status: AC
  Filled 2022-11-13: qty 1

## 2022-11-13 MED ORDER — DEXAMETHASONE SODIUM PHOSPHATE 10 MG/ML IJ SOLN
10.0000 mg | Freq: Once | INTRAMUSCULAR | Status: AC
  Administered 2022-11-13: 10 mg via INTRAMUSCULAR

## 2022-11-13 NOTE — ED Triage Notes (Signed)
Pt states she was was stung by wasp yesterday she is worried about the look of the areas. She has taken IBU and used OTC creams. She states she can't take antihistamines they make her so sleepy for days.

## 2022-11-13 NOTE — Telephone Encounter (Signed)
I attempted to contact patient by telephone but was unsuccessful. According to the patient's chart they are due for follow up in Oct with [facility]. I have left a HIPAA compliant message advising the patient to contact [facility] at [phone]. I will continue to follow up with the patient to make sure this appointment is scheduled.

## 2022-11-13 NOTE — Discharge Instructions (Addendum)
Take Augmentin antibiotic twice daily for the next 7 days to treat infection to wasp stings.  I gave you a shot of steroid in the clinic to help with swelling and allergic reaction related to the stings.  If you start to notice any new or worsening swelling, redness that is spreading and not getting better with antibiotic after 48 hours, or fever/chills, please return to urgent care or go to the nearest emergency department for severe symptoms.  I hope you feel better!

## 2022-11-13 NOTE — ED Provider Notes (Signed)
[facility]    CSN: [id number] Arrival date & time: 11/13/22  1832      History   Chief Complaint Chief Complaint  Patient presents with   Insect Bite    HPI Susan Davila is a 72 y.o. female.   Patient presents to urgent care for evaluation of redness, swelling, and tenderness to the right arm, right thigh, and left breast that started yesterday after she was stung by what she believes was a yellowjacket in her yard.  Areas of concern became red, swollen, warm, itchy, and firm overnight.  Patient is concerned for infection/allergic reaction.  Denies throat closure sensation, fever, chills, nausea, vomiting, diffuse rash, dizziness, extremity weakness, or facial swelling.  She did not take any Benadryl or other types of antihistamines to help with symptoms because she states "they make her go to sleep for multiple days".  Recently had antibiotics (amoxicillin) for dental procedure in May 2024, otherwise no recent antibiotic/steroid use.  No history of immunosuppression.  Has not attempted use of any over-the-counter medications to help with symptoms prior to arrival urgent care.     Past Medical History:  Diagnosis Date   Primary hypertension 07/28/2022   Thyroid disease     Patient Active Problem List   Diagnosis Date Noted   Primary hypertension 07/28/2022   Estrogen deficiency 03/27/2022   Encounter for general adult medical examination with abnormal findings 03/24/2022   Visit for screening mammogram 11/12/2020   Routine general medical examination at a health care facility 07/13/2019   Acquired hypothyroidism 07/13/2019   Hyperlipidemia LDL goal <130 07/13/2019   PULMONARY NODULE 01/31/2010    Past Surgical History:  Procedure Laterality Date   BARTHOLIN CYST MARSUPIALIZATION     EYE SURGERY     LIPOSUCTION     TOTAL SHOULDER ARTHROPLASTY Left 03/05/2021   Procedure: LEFT REVERSE TOTAL SHOULDER ARTHROPLASTY;  Surgeon: Cammy Copa, MD;   Location: [facility];  Service: Orthopedics;  Laterality: Left;   wisdom teeth Bilateral     OB History   No obstetric history on file.      Home Medications    Prior to Admission medications   Medication Sig Start Date End Date Taking? Authorizing Provider  amoxicillin-clavulanate (AUGMENTIN) 875-125 MG tablet Take 1 tablet by mouth every 12 (twelve) hours. 11/13/22  Yes Carlisle Beers, FNP  FOLIC ACID PO Take 1 capsule by mouth daily.   Yes [provider]  ibuprofen (ADVIL) 200 MG tablet Take 600-800 mg by mouth 2 (two) times daily as needed for moderate pain.   Yes [provider]  levothyroxine (SYNTHROID) 25 MCG tablet TAKE 1 TABLET(25 MCG) BY MOUTH DAILY BEFORE BREAKFAST 07/29/22  Yes Etta Grandchild, MD  vitamin B-12 (CYANOCOBALAMIN) 1000 MCG tablet Take 1,000 mcg by mouth daily.   Yes [provider]  torsemide (DEMADEX) 5 MG tablet Take 1 tablet (5 mg total) by mouth daily. 07/28/22   Etta Grandchild, MD    Family History Family History  Problem Relation Age of Onset   Dementia Mother 77   Alcohol abuse Father 110    Social History Social History   Tobacco Use   Smoking status: Former    Current packs/day: 0.00    Types: Cigarettes    Quit date: 09/15/1978    Years since quitting: 44.1    Passive exposure: Never   Smokeless tobacco: Never  Vaping Use   Vaping status: Never Used  Substance Use Topics  Alcohol use: Yes    Alcohol/week: 20.0 standard drinks of alcohol    Types: 20 Glasses of wine per week   Drug use: Never     Allergies   Patient has no known allergies.   Review of Systems Review of Systems   Physical Exam Triage Vital Signs ED Triage Vitals  Encounter Vitals Group     BP 11/13/22 1909 (!) 146/79     Systolic BP Percentile --      Diastolic BP Percentile --      Pulse Rate 11/13/22 1909 (!) 54     Resp 11/13/22 1909 18     Temp 11/13/22 1909 97.8 F (36.6 C)     Temp Source 11/13/22 1909 Oral     SpO2  11/13/22 1909 98 %     Weight --      Height --      Head Circumference --      Peak Flow --      Pain Score 11/13/22 1907 8     Pain Loc --      Pain Education --      Exclude from Growth Chart --    No data found.  Updated Vital Signs BP (!) 146/79 (BP Location: Right Arm)   Pulse (!) 54   Temp 97.8 F (36.6 C) (Oral)   Resp 18   SpO2 98%   Visual Acuity Right Eye Distance:   Left Eye Distance:   Bilateral Distance:    Right Eye Near:   Left Eye Near:    Bilateral Near:     Physical Exam Vitals and nursing note reviewed.  Constitutional:      Appearance: She is not ill-appearing or toxic-appearing.  HENT:     Head: Normocephalic and atraumatic.     Right Ear: Hearing and external ear normal.     Left Ear: Hearing and external ear normal.     Nose: Nose normal.     Mouth/Throat:     Lips: Pink.  Eyes:     General: Lids are normal. Vision grossly intact. Gaze aligned appropriately.     Extraocular Movements: Extraocular movements intact.     Conjunctiva/sclera: Conjunctivae normal.  Cardiovascular:     Rate and Rhythm: Normal rate and regular rhythm.     Heart sounds: Normal heart sounds, S1 normal and S2 normal.  Pulmonary:     Effort: Pulmonary effort is normal. No respiratory distress.     Breath sounds: Normal breath sounds and air entry.  Musculoskeletal:     Cervical back: Neck supple.  Skin:    General: Skin is warm and dry.     Capillary Refill: Capillary refill takes less than 2 seconds.     Findings: Erythema and rash present.          Comments: Warmth, erythema, tenderness, and underlying soft tissue swelling present to the right distal forearm, left breast, and right anterior thigh.  See images below of the right distal forearm and the right anterior thigh.  Very warm and swollen to palpation.  Strong pulses distally.  Normal ROM.  Good perfusion and capillary refill distally to all extremities.  Neurological:     General: No focal deficit  present.     Mental Status: She is alert and oriented to person, place, and time. Mental status is at baseline.     Cranial Nerves: No dysarthria or facial asymmetry.  Psychiatric:        Mood and Affect: Mood normal.  Speech: Speech normal.        Behavior: Behavior normal.        Thought Content: Thought content normal.        Judgment: Judgment normal.   Right thigh   Right forearm    UC Treatments / Results  Labs (all labs ordered are listed, but only abnormal results are displayed) Labs Reviewed - No data to display  EKG   Radiology No results found.  Procedures Procedures (including critical care time)  Medications Ordered in UC Medications  dexamethasone (DECADRON) injection 10 mg (10 mg Intramuscular Given 11/13/22 1946)    Initial Impression / Assessment and Plan / UC Course  I have reviewed the triage vital signs and the nursing notes.  Pertinent labs & imaging results that were available during my care of the patient were reviewed by me and considered in my medical decision making (see chart for details).   1.  Infected insect bite or sting, cellulitis of right wrist, left breast, right thigh Will treat cellulitis secondary to insect sting with Augmentin antibiotic twice daily for 7 days.  Dexamethasone 10 mg IM for itch and allergic reaction component.  May use warm compresses at home and Tylenol as needed for pain.  Advised to return if swelling, redness, or pain does not improve in the next 24 to 48 hours with antibiotic.  Stable HEENT exam and no findings consistent with anaphylaxis.   Counseled patient on potential for adverse effects with medications prescribed/recommended today, strict ER and return-to-clinic precautions discussed, patient verbalized understanding.    Final Clinical Impressions(s) / UC Diagnoses   Final diagnoses:  Infected insect bite or sting  Cellulitis of right wrist  Cellulitis of left breast  Cellulitis of right thigh      Discharge Instructions      Take Augmentin antibiotic twice daily for the next 7 days to treat infection to wasp stings.  I gave you a shot of steroid in the clinic to help with swelling and allergic reaction related to the stings.  If you start to notice any new or worsening swelling, redness that is spreading and not getting better with antibiotic after 48 hours, or fever/chills, please return to urgent care or go to the nearest emergency department for severe symptoms.  I hope you feel better!     ED Prescriptions     Medication Sig Dispense Auth. Provider   amoxicillin-clavulanate (AUGMENTIN) 875-125 MG tablet Take 1 tablet by mouth every 12 (twelve) hours. 14 tablet Carlisle Beers, FNP      PDMP not reviewed this encounter.   Carlisle Beers, [place] 11/13/22 1949

## 2022-12-04 ENCOUNTER — Encounter (INDEPENDENT_AMBULATORY_CARE_PROVIDER_SITE_OTHER): Payer: Self-pay

## 2022-12-09 ENCOUNTER — Telehealth: Payer: Self-pay | Admitting: Radiology

## 2022-12-09 NOTE — Telephone Encounter (Signed)
Left voice mail for patient to call back at [phone] to schedule Medicare Annual Wellness Visit    Last AWV:     Please schedule Sequential/Initial AWV with [facility]   Lalita K. CMA   

## 2022-12-30 ENCOUNTER — Encounter: Payer: Self-pay | Admitting: Internal Medicine

## 2022-12-30 ENCOUNTER — Ambulatory Visit (INDEPENDENT_AMBULATORY_CARE_PROVIDER_SITE_OTHER): Payer: PPO | Admitting: Internal Medicine

## 2022-12-30 VITALS — BP 140/82 | HR 60 | Temp 97.9°F | Ht 68.0 in | Wt 139.0 lb

## 2022-12-30 DIAGNOSIS — E2839 Other primary ovarian failure: Secondary | ICD-10-CM

## 2022-12-30 DIAGNOSIS — I10 Essential (primary) hypertension: Secondary | ICD-10-CM

## 2022-12-30 DIAGNOSIS — R001 Bradycardia, unspecified: Secondary | ICD-10-CM

## 2022-12-30 DIAGNOSIS — Z1231 Encounter for screening mammogram for malignant neoplasm of breast: Secondary | ICD-10-CM

## 2022-12-30 DIAGNOSIS — E039 Hypothyroidism, unspecified: Secondary | ICD-10-CM | POA: Diagnosis not present

## 2022-12-30 DIAGNOSIS — Z1211 Encounter for screening for malignant neoplasm of colon: Secondary | ICD-10-CM

## 2022-12-30 NOTE — Patient Instructions (Signed)
Bradycardia, Adult Bradycardia is a slower-than-normal heartbeat. A normal resting heart rate for an adult ranges from 60 to 100 beats per minute. With bradycardia, the resting heart rate is less than 60 beats per minute. Bradycardia can prevent enough oxygen from reaching certain areas of your body when you are active. It can be serious if it keeps enough oxygen from reaching your brain and other parts of your body. Bradycardia is not a problem for everyone. For some healthy adults, a slow resting heart rate is normal. What are the causes? This condition may be caused by: A problem with the heart, including: A problem with the heart's electrical system, such as a heart block. With a heart block, electrical signals between the chambers of the heart are partially or completely blocked, so they are not able to work as they should. A problem with the heart's natural pacemaker (sinus node). Heart disease. A heart attack. Heart damage. Lyme disease. A heart infection. A heart condition that is present at birth (congenital heart defect). Certain medicines that treat heart conditions. Certain conditions, such as hypothyroidism and obstructive sleep apnea. Problems with the balance of chemicals and other substances, like potassium, in the blood. Trauma. Radiation therapy. What increases the risk? You are more likely to develop this condition if you: Are age 65 or older. Have high blood pressure (hypertension), high cholesterol (hyperlipidemia), or diabetes. Drink heavily, use tobacco or nicotine products, or use drugs. What are the signs or symptoms? Symptoms of this condition include: Light-headedness. Feeling faint or fainting. Fatigue and weakness. Trouble with activity or exercise. Shortness of breath. Chest pain (angina). Drowsiness. Confusion. Dizziness. How is this diagnosed? This condition may be diagnosed based on: Your symptoms. Your medical history. A physical exam. During  the exam, your health care provider will listen to your heartbeat and check your pulse. To confirm the diagnosis, your health care provider may order tests, such as: Blood tests. An electrocardiogram (ECG). This test records the heart's electrical activity. The test can show how fast your heart is beating and whether the heartbeat is steady. A test in which you wear a portable device (event recorder or Holter monitor) to record your heart's electrical activity while you go about your day. An exercise test. How is this treated? Treatment for this condition depends on the cause of the condition and how severe your symptoms are. Treatment may involve: Treatment of the underlying condition. Changing your medicines or how much medicine you take. Having a small, battery-operated device called a pacemaker implanted under the skin. When bradycardia occurs, this device can be used to increase your heart rate and help your heart beat in a regular rhythm. Follow these instructions at home: Lifestyle Manage any health conditions that contribute to bradycardia as told by your health care provider. Follow a heart-healthy diet. A nutrition specialist (dietitian) can help educate you about healthy food options and changes. Follow an exercise program that is approved by your health care provider. Maintain a healthy weight. Try to reduce or manage your stress, such as with yoga or meditation. If you need help reducing stress, ask your health care provider. Do not use any products that contain nicotine or tobacco. These products include cigarettes, chewing tobacco, and vaping devices, such as e-cigarettes. If you need help quitting, ask your health care provider. Do not use illegal drugs. Alcohol use If you drink alcohol: Limit how much you have to: 0-1 drink a day for women who are not pregnant. 0-2 drinks a day   for men. Know how much alcohol is in a drink. In the [place] one drink equals one 12 oz bottle of  beer (355 mL), one 5 oz glass of wine (148 mL), or one 1 oz glass of hard liquor (44 mL). General instructions Take over-the-counter and prescription medicines only as told by your health care provider. Keep all follow-up visits. This is important. How is this prevented? In some cases, bradycardia may be prevented by: Treating underlying medical problems. Stopping behaviors or medicines that can trigger the condition. Contact a health care provider if: You feel light-headed or dizzy. You almost faint. You feel weak or are easily fatigued during physical activity. You experience confusion or have memory problems. Get help right away if: You faint. You have chest pains or an irregular heartbeat (palpitations). You have trouble breathing. These symptoms may represent a serious problem that is an emergency. Do not wait to see if the symptoms will go away. Get medical help right away. Call your local emergency services (911 in the [place] Do not drive yourself to the hospital. Summary Bradycardia is a slower-than-normal heartbeat. With bradycardia, the resting heart rate is less than 60 beats per minute. Treatment for this condition depends on the cause. Manage any health conditions that contribute to bradycardia as told by your health care provider. Do not use any products that contain nicotine or tobacco. These products include cigarettes, chewing tobacco, and vaping devices, such as e-cigarettes. Keep all follow-up visits. This is important. This information is not intended to replace advice given to you by your health care provider. Make sure you discuss any questions you have with your health care provider. Document Revised: 07/29/2020 Document Reviewed: 07/29/2020 Elsevier Patient Education  2024 [employer]  

## 2022-12-30 NOTE — Progress Notes (Unsigned)
Subjective:  Patient ID: Susan Davila, female    DOB: February 03, 1951  Age: 72 y.o. MRN: [id number]  CC: Hypertension and Hypothyroidism   HPI Susan Davila presents for f/up -----  Discussed the use of AI scribe software for clinical note transcription with the patient, who gave verbal consent to proceed.  History of Present Illness   The patient, with a history of hypertension and hypothyroidism, presents for a routine check-up. They were unaware of their elevated blood pressure, denying any associated symptoms such as headaches, blurred vision, chest pain, shortness of breath, or lower extremity edema.   The patient has been maintaining an active lifestyle, working out four to five times a week and walking regularly. Despite this, they report an increased sensitivity to cold, particularly in their feet, which they attribute to their hypothyroidism. This symptom is sporadic but has been noticeable enough to warrant wearing a jacket when others do not feel the need.      She is not taking the loop diuretic.  Outpatient Medications Prior to Visit  Medication Sig Dispense Refill   FOLIC ACID PO Take 1 capsule by mouth daily.     ibuprofen (ADVIL) 200 MG tablet Take 600-800 mg by mouth 2 (two) times daily as needed for moderate pain.     vitamin B-12 (CYANOCOBALAMIN) 1000 MCG tablet Take 1,000 mcg by mouth daily.     amoxicillin-clavulanate (AUGMENTIN) 875-125 MG tablet Take 1 tablet by mouth every 12 (twelve) hours. 14 tablet 0   levothyroxine (SYNTHROID) 25 MCG tablet TAKE 1 TABLET(25 MCG) BY MOUTH DAILY BEFORE BREAKFAST 90 tablet 1   torsemide (DEMADEX) 5 MG tablet Take 1 tablet (5 mg total) by mouth daily. 90 tablet 1   No facility-administered medications prior to visit.    ROS Review of Systems  Constitutional: Negative.  Negative for appetite change, diaphoresis, fatigue and unexpected weight change.  HENT: Negative.    Respiratory: Negative.  Negative for cough, chest  tightness, shortness of breath and wheezing.   Cardiovascular:  Negative for chest pain, palpitations and leg swelling.  Gastrointestinal:  Negative for abdominal pain, diarrhea, nausea and vomiting.  Endocrine: Positive for cold intolerance.  Genitourinary: Negative.  Negative for difficulty urinating.  Musculoskeletal: Negative.  Negative for arthralgias, joint swelling and myalgias.  Skin: Negative.   Neurological:  Negative for dizziness and weakness.  Hematological:  Negative for adenopathy. Does not bruise/bleed easily.  Psychiatric/Behavioral: Negative.      Objective:  BP (!) 140/82 (BP Location: Left Arm, Patient Position: Sitting, Cuff Size: Normal)   Pulse 60   Temp 97.9 F (36.6 C) (Oral)   Ht 5\' 8"  (1.727 m)   Wt 139 lb (63 kg)   SpO2 96%   BMI 21.13 kg/m   BP Readings from Last 3 Encounters:  12/30/22 (!) 140/82  11/13/22 (!) 146/79  07/28/22 136/84    Wt Readings from Last 3 Encounters:  12/30/22 139 lb (63 kg)  07/28/22 138 lb (62.6 kg)  03/24/22 141 lb (64 kg)    Physical Exam Vitals reviewed.  HENT:     Mouth/Throat:     Mouth: Mucous membranes are moist.  Eyes:     General: No scleral icterus.    Conjunctiva/sclera: Conjunctivae normal.  Cardiovascular:     Rate and Rhythm: Regular rhythm. Bradycardia present.     Heart sounds: Normal heart sounds, S1 normal and S2 normal. No murmur heard.    No friction rub. No gallop.  Comments: EKG- SB, 55 bpm No LVH, Q waves, or ST/T waves  Pulmonary:     Effort: Pulmonary effort is normal.     Breath sounds: No stridor. No wheezing, rhonchi or rales.  Abdominal:     General: Abdomen is flat.     Palpations: There is no mass.     Tenderness: There is no abdominal tenderness. There is no guarding.     Hernia: No hernia is present.  Musculoskeletal:     Cervical back: Neck supple.     Right lower leg: No edema.     Left lower leg: No edema.  Skin:    General: Skin is warm and dry.  Neurological:      General: No focal deficit present.     Mental Status: She is alert. Mental status is at baseline.  Psychiatric:        Mood and Affect: Mood normal.        Behavior: Behavior normal.     Lab Results  Component Value Date   WBC 3.6 (L) 12/30/2022   HGB 13.5 12/30/2022   HCT 41.3 12/30/2022   PLT 214.0 12/30/2022   GLUCOSE 93 12/30/2022   CHOL 164 03/24/2022   TRIG 52.0 03/24/2022   HDL 82.20 03/24/2022   LDLCALC 72 03/24/2022   ALT 17 03/24/2022   AST 16 03/24/2022   NA 139 12/30/2022   K 4.3 12/30/2022   CL 100 12/30/2022   CREATININE 0.72 12/30/2022   BUN 20 12/30/2022   CO2 32 12/30/2022   TSH 1.57 12/30/2022    No results found.  Assessment & Plan:   Primary hypertension- She has not achieved her blood pressure goal of 130/80.  I have asked her to be more compliant with the loop diuretic. -     EKG 12-Lead -     Basic metabolic panel; Future -     CBC with Differential/Platelet; Future -     TSH; Future -     Torsemide; Take 1 tablet (5 mg total) by mouth daily.  Dispense: 90 tablet; Refill: 1  Bradycardia, sinus- She is asymptomatic with this. -     EKG 12-Lead -     TSH; Future  Acquired hypothyroidism- She is euthyroid. -     TSH; Future -     Levothyroxine Sodium; TAKE 1 TABLET(25 MCG) BY MOUTH DAILY BEFORE BREAKFAST  Dispense: 90 tablet; Refill: 1  Estrogen deficiency -     DG Bone Density; Future  Screening mammogram for breast cancer -     Digital Screening Mammogram, Left and Right; Future  Screening for colon cancer -     Cologuard     Follow-up: Return in about 6 months (around 06/29/2023).  [name], MD

## 2022-12-31 LAB — BASIC METABOLIC PANEL
BUN: 20 mg/dL (ref 6–23)
CO2: 32 meq/L (ref 19–32)
Calcium: 9.3 mg/dL (ref 8.4–10.5)
Chloride: 100 meq/L (ref 96–112)
Creatinine, Ser: 0.72 mg/dL (ref 0.40–1.20)
GFR: 84.49 mL/min (ref >60.00)
Glucose, Bld: 93 mg/dL (ref 70–99)
Potassium: 4.3 meq/L (ref 3.5–5.1)
Sodium: 139 meq/L (ref 135–145)

## 2022-12-31 LAB — CBC WITH DIFFERENTIAL/PLATELET
Basophils Absolute: 0 10*3/uL (ref 0.0–0.1)
Basophils Relative: 1.3 % (ref 0.0–3.0)
Eosinophils Absolute: 0.1 10*3/uL (ref 0.0–0.7)
Eosinophils Relative: 4 % (ref 0.0–5.0)
HCT: 41.3 % (ref 36.0–46.0)
Hemoglobin: 13.5 g/dL (ref 12.0–15.0)
Lymphocytes Relative: 42.9 % (ref 12.0–46.0)
Lymphs Abs: 1.6 10*3/uL (ref 0.7–4.0)
MCHC: 32.6 g/dL (ref 30.0–36.0)
MCV: 95.8 fl (ref 78.0–100.0)
Monocytes Absolute: 0.4 10*3/uL (ref 0.1–1.0)
Monocytes Relative: 11 % (ref 3.0–12.0)
Neutro Abs: 1.5 10*3/uL (ref 1.4–7.7)
Neutrophils Relative %: 40.8 % — ABNORMAL LOW (ref 43.0–77.0)
Platelets: 214 10*3/uL (ref 150.0–400.0)
RBC: 4.31 Mil/uL (ref 3.87–5.11)
RDW: 12.8 % (ref 11.5–15.5)
WBC: 3.6 10*3/uL — ABNORMAL LOW (ref 4.0–10.5)

## 2022-12-31 LAB — TSH: TSH: 1.57 u[IU]/mL (ref 0.35–5.50)

## 2022-12-31 MED ORDER — LEVOTHYROXINE SODIUM 25 MCG PO TABS
ORAL_TABLET | ORAL | 1 refills | Status: DC
Start: 2022-12-31 — End: 2023-05-27

## 2023-01-01 MED ORDER — TORSEMIDE 5 MG PO TABS
5.0000 mg | ORAL_TABLET | Freq: Every day | ORAL | 1 refills | Status: DC
Start: 2023-01-01 — End: 2023-07-20

## 2023-01-11 DIAGNOSIS — Z1211 Encounter for screening for malignant neoplasm of colon: Secondary | ICD-10-CM | POA: Diagnosis not present

## 2023-01-13 ENCOUNTER — Ambulatory Visit: Payer: PPO | Admitting: Podiatry

## 2023-01-13 ENCOUNTER — Encounter: Payer: Self-pay | Admitting: Podiatry

## 2023-01-13 DIAGNOSIS — M722 Plantar fascial fibromatosis: Secondary | ICD-10-CM | POA: Diagnosis not present

## 2023-01-13 MED ORDER — TRIAMCINOLONE ACETONIDE 40 MG/ML IJ SUSP
20.0000 mg | Freq: Once | INTRAMUSCULAR | Status: AC
Start: 2023-01-13 — End: 2023-01-13
  Administered 2023-01-13: 20 mg

## 2023-01-13 NOTE — Progress Notes (Signed)
She presents today states that my plantar fasciitis is acting up a bit as she refers to her left foot.  Objective: Vital signs stable alert and oriented x 3 pulses are palpable.  She has pain on palpation medial calcaneal tubercle of her left heel.  Assessment: Plantar fasciitis left.  Plan: Injected the left heel today 20 mg Kenalog 5 mg Marcaine point maximal tenderness.  Follow-up with her as needed

## 2023-01-14 ENCOUNTER — Ambulatory Visit: Payer: PPO | Admitting: Orthopedic Surgery

## 2023-01-16 LAB — COLOGUARD: COLOGUARD: NEGATIVE

## 2023-01-21 ENCOUNTER — Ambulatory Visit: Payer: PPO | Admitting: Internal Medicine

## 2023-01-21 ENCOUNTER — Encounter: Payer: Self-pay | Admitting: Internal Medicine

## 2023-01-21 ENCOUNTER — Ambulatory Visit (INDEPENDENT_AMBULATORY_CARE_PROVIDER_SITE_OTHER): Payer: PPO

## 2023-01-21 VITALS — BP 118/70 | HR 64 | Temp 97.8°F | Ht 68.0 in

## 2023-01-21 DIAGNOSIS — M25532 Pain in left wrist: Secondary | ICD-10-CM | POA: Diagnosis not present

## 2023-01-21 DIAGNOSIS — S6992XA Unspecified injury of left wrist, hand and finger(s), initial encounter: Secondary | ICD-10-CM | POA: Insufficient documentation

## 2023-01-21 NOTE — Patient Instructions (Signed)
Blue-Emu cream -- use 2-3 times a day ? ?

## 2023-01-21 NOTE — Progress Notes (Signed)
Subjective:  Patient ID: Susan Davila, female    DOB: [birth year]  Age: 72 y.o. MRN: [id number]  CC: Wrist Injury (Pt had a fall and landed on lt wrist. Pt is having pain in lt wrist a/w swelling and bruising )   HPI Susan Davila presents for L wrist pain - pt fell last night at home on the L wrist. Pt worked today It is much better    Outpatient Medications Prior to Visit  Medication Sig Dispense Refill   FOLIC ACID PO Take 1 capsule by mouth daily.     ibuprofen (ADVIL) 200 MG tablet Take 600-800 mg by mouth 2 (two) times daily as needed for moderate pain.     levothyroxine (SYNTHROID) 25 MCG tablet TAKE 1 TABLET(25 MCG) BY MOUTH DAILY BEFORE BREAKFAST 90 tablet 1   torsemide (DEMADEX) 5 MG tablet Take 1 tablet (5 mg total) by mouth daily. 90 tablet 1   vitamin B-12 (CYANOCOBALAMIN) 1000 MCG tablet Take 1,000 mcg by mouth daily.     No facility-administered medications prior to visit.    ROS: Review of Systems  Constitutional:  Negative for diaphoresis.  Musculoskeletal:  Positive for arthralgias. Negative for back pain.    Objective:  BP 118/70 (BP Location: Right Arm, Patient Position: Sitting, Cuff Size: Normal)   Pulse 64   Temp 97.8 F (36.6 C) (Oral)   Ht 5\' 8"  (1.727 m)   SpO2 99%   BMI 21.13 kg/m   BP Readings from Last 3 Encounters:  01/30/23 130/82  01/21/23 118/70  12/30/22 (!) 140/82    Wt Readings from Last 3 Encounters:  01/30/23 138 lb (62.6 kg)  12/30/22 139 lb (63 kg)  07/28/22 138 lb (62.6 kg)    Physical Exam Constitutional:      General: She is not in acute distress.    Appearance: She is well-developed.  HENT:     Head: Normocephalic.     Right Ear: External ear normal.     Left Ear: External ear normal.     Nose: Nose normal.  Eyes:     General:        Right eye: No discharge.        Left eye: No discharge.     Conjunctiva/sclera: Conjunctivae normal.     Pupils: Pupils are equal, round, and reactive to light.  Neck:      Thyroid: No thyromegaly.     Vascular: No JVD.     Trachea: No tracheal deviation.  Cardiovascular:     Rate and Rhythm: Normal rate and regular rhythm.     Heart sounds: Normal heart sounds.  Pulmonary:     Effort: No respiratory distress.     Breath sounds: No stridor. No wheezing.  Abdominal:     General: Bowel sounds are normal. There is no distension.     Palpations: Abdomen is soft. There is no mass.     Tenderness: There is no abdominal tenderness. There is no guarding or rebound.  Musculoskeletal:        General: Tenderness and signs of injury present.     Cervical back: Normal range of motion and neck supple. No rigidity.  Lymphadenopathy:     Cervical: No cervical adenopathy.  Skin:    Findings: No erythema or rash.  Neurological:     Cranial Nerves: No cranial nerve deficit.     Motor: No abnormal muscle tone.     Coordination: Coordination normal.     Deep  Tendon Reflexes: Reflexes normal.  Psychiatric:        Behavior: Behavior normal.        Thought Content: Thought content normal.        Judgment: Judgment normal.    Left wrist is swollen, tender with range of motion and to palpation Muscle strength is slightly decreased due to pain.  Lab Results  Component Value Date   WBC 3.6 (L) 12/30/2022   HGB 13.5 12/30/2022   HCT 41.3 12/30/2022   PLT 214.0 12/30/2022   GLUCOSE 93 12/30/2022   CHOL 164 03/24/2022   TRIG 52.0 03/24/2022   HDL 82.20 03/24/2022   LDLCALC 72 03/24/2022   ALT 17 03/24/2022   AST 16 03/24/2022   NA 139 12/30/2022   K 4.3 12/30/2022   CL 100 12/30/2022   CREATININE 0.72 12/30/2022   BUN 20 12/30/2022   CO2 32 12/30/2022   TSH 1.57 12/30/2022    No results found.  Assessment & Plan:   Problem List Items Addressed This Visit     Wrist injury, left, initial encounter - Primary    Likely musculoskeletal strain.  Obtain left wrist x-ray. Ace wrap applied Ice/sleep treatment discussed Tylenol as needed      Relevant Orders    DG Wrist Complete Left      No orders of the defined types were placed in this encounter.     Follow-up: No follow-ups on file.  Sonda Primes, MD

## 2023-01-30 ENCOUNTER — Telehealth: Payer: Self-pay | Admitting: Internal Medicine

## 2023-01-30 ENCOUNTER — Encounter: Payer: Self-pay | Admitting: Internal Medicine

## 2023-01-30 ENCOUNTER — Ambulatory Visit (INDEPENDENT_AMBULATORY_CARE_PROVIDER_SITE_OTHER): Payer: PPO | Admitting: Internal Medicine

## 2023-01-30 VITALS — BP 130/82 | HR 65 | Temp 97.8°F | Ht 68.0 in | Wt 138.0 lb

## 2023-01-30 DIAGNOSIS — I10 Essential (primary) hypertension: Secondary | ICD-10-CM | POA: Diagnosis not present

## 2023-01-30 DIAGNOSIS — M25432 Effusion, left wrist: Secondary | ICD-10-CM | POA: Diagnosis not present

## 2023-01-30 DIAGNOSIS — E039 Hypothyroidism, unspecified: Secondary | ICD-10-CM | POA: Diagnosis not present

## 2023-01-30 DIAGNOSIS — M25532 Pain in left wrist: Secondary | ICD-10-CM | POA: Diagnosis not present

## 2023-01-30 NOTE — Progress Notes (Signed)
Patient ID: Susan Davila, female   DOB: 05/02/1950, 72 y.o.   MRN: [id number]        Chief Complaint: follow up left wrist pain and swelling > 1 wk after fall to outstretched hand wrsit       HPI:  Susan Davila is a 72 y.o. female here with above, just not improved after seen and tx with nsaid prn,Still with pain, and seems to hear and feel some moveement in a small way in the wrist area with flexion extention.  unfortuantely plain film done but Still results pending.  Pt denies chest pain, increased sob or doe, wheezing, orthopnea, PND, increased LE swelling, palpitations, dizziness or syncope.  Pt denies polydipsia, polyuria, or new focal neuro s/s.         Wt Readings from Last 3 Encounters:  01/30/23 138 lb (62.6 kg)  12/30/22 139 lb (63 kg)  07/28/22 138 lb (62.6 kg)   BP Readings from Last 3 Encounters:  01/30/23 130/82  01/21/23 118/70  12/30/22 (!) 140/82         Past Medical History:  Diagnosis Date   Primary hypertension 07/28/2022   Thyroid disease    Past Surgical History:  Procedure Laterality Date   BARTHOLIN CYST MARSUPIALIZATION     EYE SURGERY     LIPOSUCTION     TOTAL SHOULDER ARTHROPLASTY Left 03/05/2021   Procedure: LEFT REVERSE TOTAL SHOULDER ARTHROPLASTY;  Surgeon: Cammy Copa, MD;  Location: [facility] OR;  Service: Orthopedics;  Laterality: Left;   wisdom teeth Bilateral     reports that she quit smoking about 44 years ago. Her smoking use included cigarettes. She has never been exposed to tobacco smoke. She has never used smokeless tobacco. She reports current alcohol use of about 20.0 standard drinks of alcohol per week. She reports that she does not use drugs. family history includes Alcohol abuse (age of onset: 47) in her father; Dementia (age of onset: 88) in her mother. No Known Allergies Current Outpatient Medications on File Prior to Visit  Medication Sig Dispense Refill   FOLIC ACID PO Take 1 capsule by mouth daily.     ibuprofen (ADVIL) 200 MG  tablet Take 600-800 mg by mouth 2 (two) times daily as needed for moderate pain.     levothyroxine (SYNTHROID) 25 MCG tablet TAKE 1 TABLET(25 MCG) BY MOUTH DAILY BEFORE BREAKFAST 90 tablet 1   torsemide (DEMADEX) 5 MG tablet Take 1 tablet (5 mg total) by mouth daily. 90 tablet 1   vitamin B-12 (CYANOCOBALAMIN) 1000 MCG tablet Take 1,000 mcg by mouth daily.     No current facility-administered medications on file prior to visit.        ROS:  All others reviewed and negative.  Objective        PE:  BP 130/82 (BP Location: Right Arm, Patient Position: Sitting, Cuff Size: Normal)   Pulse 65   Temp 97.8 F (36.6 C) (Oral)   Ht 5\' 8"  (1.727 m)   Wt 138 lb (62.6 kg)   SpO2 95%   BMI 20.98 kg/m                 Constitutional: Pt appears in NAD               HENT: Head: NCAT.                Right Ear: External ear normal.  Left Ear: External ear normal.                Eyes: . Pupils are equal, round, and reactive to light. Conjunctivae and EOM are normal               Nose: without d/c or deformity               Neck: Neck supple. Gross normal ROM               Cardiovascular: Normal rate and regular rhythm.                 Pulmonary/Chest: Effort normal and breath sounds without rales or wheezing.                Left wrist with 2 areas of focal tender, diffuse swelling               Neurological: Pt is alert. At baseline orientation, motor grossly intact               Skin: Skin is warm. No rashes, no other new lesions, LE edema - none               Psychiatric: Pt behavior is normal without agitation   Micro: none  Cardiac tracings I have personally interpreted today:  none  Pertinent Radiological findings (summarize): none   Lab Results  Component Value Date   WBC 3.6 (L) 12/30/2022   HGB 13.5 12/30/2022   HCT 41.3 12/30/2022   PLT 214.0 12/30/2022   GLUCOSE 93 12/30/2022   CHOL 164 03/24/2022   TRIG 52.0 03/24/2022   HDL 82.20 03/24/2022   LDLCALC 72  03/24/2022   ALT 17 03/24/2022   AST 16 03/24/2022   NA 139 12/30/2022   K 4.3 12/30/2022   CL 100 12/30/2022   CREATININE 0.72 12/30/2022   BUN 20 12/30/2022   CO2 32 12/30/2022   TSH 1.57 12/30/2022   Assessment/Plan:  Susan Davila is a 72 y.o. White or Caucasian [1] female with  has a past medical history of Primary hypertension (07/28/2022) and Thyroid disease.  Pain and swelling of left wrist Unfortuantely no significant improvement in 1 wk that would normally be seen with soft tissue or tendon injury strains - I suspect underlying fracture of some kind, plain film results still pending, now for referral hand surgury, declines other pain med for now  Primary hypertension BP Readings from Last 3 Encounters:  01/30/23 130/82  01/21/23 118/70  12/30/22 (!) 140/82   Stable, pt to continue medical treatment  - diet, wt control   Acquired hypothyroidism . Lab Results  Component Value Date   TSH 1.57 12/30/2022   Stable, pt to continue levothyroxine 25 mcg qd  Followup: Return if symptoms worsen or fail to improve.  Oliver Barre, MD 02/01/2023 2:58 PM [facility]

## 2023-01-30 NOTE — Telephone Encounter (Signed)
Patient is calling concerning the xray results for her right wrist - please call:  [phone]

## 2023-01-30 NOTE — Telephone Encounter (Signed)
Pt has been informed that 10/2 xray results are not back.   Pt informed me that is feeling some warmth in the wrist. This CMA informed her to schedule another OV due to the warmth that she is experiencing. She has been scheduled today 10/11 @ 4pm with Dr. Oliver Barre.

## 2023-02-01 ENCOUNTER — Encounter: Payer: Self-pay | Admitting: Internal Medicine

## 2023-02-01 NOTE — Assessment & Plan Note (Signed)
.   Lab Results  Component Value Date   TSH 1.57 12/30/2022   Stable, pt to continue levothyroxine 25 mcg qd

## 2023-02-01 NOTE — Assessment & Plan Note (Signed)
BP Readings from Last 3 Encounters:  01/30/23 130/82  01/21/23 118/70  12/30/22 (!) 140/82   Stable, pt to continue medical treatment  - diet, wt control

## 2023-02-01 NOTE — Assessment & Plan Note (Signed)
Unfortuantely no significant improvement in 1 wk that would normally be seen with soft tissue or tendon injury strains - I suspect underlying fracture of some kind, plain film results still pending, now for referral hand surgury, declines other pain med for now

## 2023-02-01 NOTE — Patient Instructions (Signed)
You will be contacted regarding the referral for: hand surgury  Please continue all other medications as before, and refills have been done if requested.  Please have the pharmacy call with any other refills you may need.  Please keep your appointments with your specialists as you may have planned

## 2023-02-03 NOTE — Assessment & Plan Note (Signed)
Likely musculoskeletal strain.  Obtain left wrist x-ray. Ace wrap applied Ice/sleep treatment discussed Tylenol as needed

## 2023-05-27 ENCOUNTER — Other Ambulatory Visit: Payer: Self-pay | Admitting: Internal Medicine

## 2023-05-27 DIAGNOSIS — E039 Hypothyroidism, unspecified: Secondary | ICD-10-CM

## 2023-05-28 DIAGNOSIS — M79641 Pain in right hand: Secondary | ICD-10-CM | POA: Diagnosis not present

## 2023-05-28 DIAGNOSIS — M25512 Pain in left shoulder: Secondary | ICD-10-CM | POA: Diagnosis not present

## 2023-06-08 DIAGNOSIS — M79641 Pain in right hand: Secondary | ICD-10-CM | POA: Diagnosis not present

## 2023-06-08 DIAGNOSIS — M25512 Pain in left shoulder: Secondary | ICD-10-CM | POA: Diagnosis not present

## 2023-06-10 ENCOUNTER — Encounter: Payer: Self-pay | Admitting: Podiatry

## 2023-06-10 ENCOUNTER — Ambulatory Visit: Payer: PPO | Admitting: Podiatry

## 2023-06-10 DIAGNOSIS — M722 Plantar fascial fibromatosis: Secondary | ICD-10-CM

## 2023-06-10 MED ORDER — TRIAMCINOLONE ACETONIDE 10 MG/ML IJ SUSP
10.0000 mg | Freq: Once | INTRAMUSCULAR | Status: AC
Start: 2023-06-10 — End: 2023-06-10
  Administered 2023-06-10: 10 mg via INTRA_ARTICULAR

## 2023-06-11 NOTE — Progress Notes (Signed)
 Subjective:   Patient ID: Susan Davila, female   DOB: 73 y.o.   MRN: [id number]   HPI Patient presents stating she developed a lot of pain in the left plantar heel stating it has been very sore   ROS      Objective:  Physical Exam  Upon pressure acute pain plantar heel left at the insertion tendon calcaneus     Assessment:  Acute plantar fasciitis left     Plan:  H&P reviewed went ahead today sterile prep reinjected the fascia 3 mg Kenalog 5 mg Xylocaine instructed on support and anti-inflammatories reappoint as symptoms indicate

## 2023-06-19 DIAGNOSIS — M25512 Pain in left shoulder: Secondary | ICD-10-CM | POA: Diagnosis not present

## 2023-06-19 DIAGNOSIS — M79641 Pain in right hand: Secondary | ICD-10-CM | POA: Diagnosis not present

## 2023-07-01 DIAGNOSIS — M79641 Pain in right hand: Secondary | ICD-10-CM | POA: Diagnosis not present

## 2023-07-01 DIAGNOSIS — M25512 Pain in left shoulder: Secondary | ICD-10-CM | POA: Diagnosis not present

## 2023-07-13 DIAGNOSIS — M79641 Pain in right hand: Secondary | ICD-10-CM | POA: Diagnosis not present

## 2023-07-13 DIAGNOSIS — M25512 Pain in left shoulder: Secondary | ICD-10-CM | POA: Diagnosis not present

## 2023-07-20 ENCOUNTER — Other Ambulatory Visit: Payer: Self-pay | Admitting: Internal Medicine

## 2023-07-20 DIAGNOSIS — M25512 Pain in left shoulder: Secondary | ICD-10-CM | POA: Diagnosis not present

## 2023-07-20 DIAGNOSIS — I10 Essential (primary) hypertension: Secondary | ICD-10-CM

## 2023-07-20 DIAGNOSIS — M79641 Pain in right hand: Secondary | ICD-10-CM | POA: Diagnosis not present

## 2023-08-13 ENCOUNTER — Encounter: Payer: Self-pay | Admitting: Internal Medicine

## 2023-08-13 ENCOUNTER — Ambulatory Visit (INDEPENDENT_AMBULATORY_CARE_PROVIDER_SITE_OTHER): Admitting: Internal Medicine

## 2023-08-13 ENCOUNTER — Other Ambulatory Visit: Payer: Self-pay | Admitting: Internal Medicine

## 2023-08-13 VITALS — BP 136/76 | HR 64 | Temp 97.5°F | Resp 16 | Ht 68.0 in | Wt 138.4 lb

## 2023-08-13 DIAGNOSIS — Z1231 Encounter for screening mammogram for malignant neoplasm of breast: Secondary | ICD-10-CM | POA: Insufficient documentation

## 2023-08-13 DIAGNOSIS — I10 Essential (primary) hypertension: Secondary | ICD-10-CM | POA: Diagnosis not present

## 2023-08-13 DIAGNOSIS — M25512 Pain in left shoulder: Secondary | ICD-10-CM | POA: Diagnosis not present

## 2023-08-13 DIAGNOSIS — E2839 Other primary ovarian failure: Secondary | ICD-10-CM | POA: Diagnosis not present

## 2023-08-13 DIAGNOSIS — E039 Hypothyroidism, unspecified: Secondary | ICD-10-CM

## 2023-08-13 DIAGNOSIS — M79641 Pain in right hand: Secondary | ICD-10-CM | POA: Diagnosis not present

## 2023-08-13 DIAGNOSIS — Z0000 Encounter for general adult medical examination without abnormal findings: Secondary | ICD-10-CM

## 2023-08-13 DIAGNOSIS — E785 Hyperlipidemia, unspecified: Secondary | ICD-10-CM

## 2023-08-13 DIAGNOSIS — Z0001 Encounter for general adult medical examination with abnormal findings: Secondary | ICD-10-CM | POA: Insufficient documentation

## 2023-08-13 LAB — URINALYSIS, ROUTINE W REFLEX MICROSCOPIC
Bilirubin Urine: NEGATIVE
Hgb urine dipstick: NEGATIVE
Ketones, ur: NEGATIVE
Leukocytes,Ua: NEGATIVE
Nitrite: NEGATIVE
RBC / HPF: NONE SEEN (ref 0–?)
Specific Gravity, Urine: 1.01 (ref 1.000–1.030)
Total Protein, Urine: NEGATIVE
Urine Glucose: NEGATIVE
Urobilinogen, UA: 0.2 (ref 0.0–1.0)
pH: 6 (ref 5.0–8.0)

## 2023-08-13 LAB — CBC WITH DIFFERENTIAL/PLATELET
Basophils Absolute: 0.1 10*3/uL (ref 0.0–0.1)
Basophils Relative: 1.7 % (ref 0.0–3.0)
Eosinophils Absolute: 0.2 10*3/uL (ref 0.0–0.7)
Eosinophils Relative: 7.3 % — ABNORMAL HIGH (ref 0.0–5.0)
HCT: 42.1 % (ref 36.0–46.0)
Hemoglobin: 14.2 g/dL (ref 12.0–15.0)
Lymphocytes Relative: 34.3 % (ref 12.0–46.0)
Lymphs Abs: 1 10*3/uL (ref 0.7–4.0)
MCHC: 33.7 g/dL (ref 30.0–36.0)
MCV: 95.1 fl (ref 78.0–100.0)
Monocytes Absolute: 0.3 10*3/uL (ref 0.1–1.0)
Monocytes Relative: 10.8 % (ref 3.0–12.0)
Neutro Abs: 1.3 10*3/uL — ABNORMAL LOW (ref 1.4–7.7)
Neutrophils Relative %: 45.9 % (ref 43.0–77.0)
Platelets: 226 10*3/uL (ref 150.0–400.0)
RBC: 4.43 Mil/uL (ref 3.87–5.11)
RDW: 12.9 % (ref 11.5–15.5)
WBC: 2.9 10*3/uL — ABNORMAL LOW (ref 4.0–10.5)

## 2023-08-13 LAB — BASIC METABOLIC PANEL WITH GFR
BUN: 24 mg/dL — ABNORMAL HIGH (ref 6–23)
CO2: 33 meq/L — ABNORMAL HIGH (ref 19–32)
Calcium: 9.6 mg/dL (ref 8.4–10.5)
Chloride: 101 meq/L (ref 96–112)
Creatinine, Ser: 0.82 mg/dL (ref 0.40–1.20)
GFR: 71.97 mL/min (ref >60.00)
Glucose, Bld: 90 mg/dL (ref 70–99)
Potassium: 4.3 meq/L (ref 3.5–5.1)
Sodium: 140 meq/L (ref 135–145)

## 2023-08-13 LAB — HEPATIC FUNCTION PANEL
ALT: 20 U/L (ref 0–35)
AST: 23 U/L (ref 0–37)
Albumin: 4.6 g/dL (ref 3.5–5.2)
Alkaline Phosphatase: 43 U/L (ref 39–117)
Bilirubin, Direct: 0.1 mg/dL (ref 0.0–0.3)
Total Bilirubin: 0.6 mg/dL (ref 0.2–1.2)
Total Protein: 6.8 g/dL (ref 6.0–8.3)

## 2023-08-13 LAB — LIPID PANEL
Cholesterol: 226 mg/dL — ABNORMAL HIGH (ref 0–200)
HDL: 108.5 mg/dL (ref >39.00)
LDL Cholesterol: 104 mg/dL — ABNORMAL HIGH (ref 0–99)
NonHDL: 117.13
Total CHOL/HDL Ratio: 2
Triglycerides: 65 mg/dL (ref 0.0–149.0)
VLDL: 13 mg/dL (ref 0.0–40.0)

## 2023-08-13 LAB — TSH: TSH: 1.82 u[IU]/mL (ref 0.35–5.50)

## 2023-08-13 MED ORDER — TORSEMIDE 5 MG PO TABS
5.0000 mg | ORAL_TABLET | Freq: Every day | ORAL | 1 refills | Status: DC
Start: 2023-08-13 — End: 2024-02-25

## 2023-08-13 MED ORDER — LEVOTHYROXINE SODIUM 25 MCG PO TABS
ORAL_TABLET | ORAL | 0 refills | Status: DC
Start: 2023-08-13 — End: 2023-11-13

## 2023-08-13 NOTE — Patient Instructions (Signed)

## 2023-08-13 NOTE — Progress Notes (Unsigned)
 Subjective:  Patient ID: Susan Davila, female    DOB: [birth year]  Age: 73 y.o. MRN: [id number]  CC: Annual Exam, Hypertension, and Hypothyroidism   HPI Susan Davila presents for a CPX and f/up -----  Discussed the use of AI scribe software for clinical note transcription with the patient, who gave verbal consent to proceed.  History of Present Illness   Susan Davila is a 73 year old female who presents for a follow-up visit.  She has no new thyroid  symptoms, experiencing only occasional feelings of being cold or hot. She reports no swelling in her legs or feet and finds desonide helpful for her skin. She has been less active due to a recent job loss but maintains an active lifestyle by regularly working out, walking daily, and engaging in yard and house work. She denies symptoms suggestive of heart disease and feels good during physical activity.  She has a history of a painful spot on her chest bone behind her right breast, which was previously evaluated with negative mammograms. She performs regular breast exams and has not felt any lumps recently.  She has received the COVID and flu vaccines but declined the shingles and pneumonia vaccines. She manages hydration to prevent constipation. She is currently taking torsemide  and a thyroid  supplement, having discontinued folic acid, ibuprofen , and B12. Her blood pressure is stable, and she rarely sits down due to her active lifestyle.       Outpatient Medications Prior to Visit  Medication Sig Dispense Refill   levothyroxine  (SYNTHROID ) 25 MCG tablet TAKE 1 TABLET(25 MCG) BY MOUTH DAILY BEFORE BREAKFAST 90 tablet 0   torsemide  (DEMADEX ) 5 MG tablet Take 1 tablet (5 mg total) by mouth daily. Schedule an appt for further refills 60 tablet 0   FOLIC ACID PO Take 1 capsule by mouth daily.     ibuprofen  (ADVIL ) 200 MG tablet Take 600-800 mg by mouth 2 (two) times daily as needed for moderate pain.     vitamin B-12 (CYANOCOBALAMIN) 1000 MCG  tablet Take 1,000 mcg by mouth daily.     No facility-administered medications prior to visit.    ROS Review of Systems  Objective:  BP 136/76 (BP Location: Left Arm, Patient Position: Sitting, Cuff Size: Normal)   Pulse 64   Temp (!) 97.5 F (36.4 C) (Oral)   Resp 16   Ht 5\' 8"  (1.727 m)   Wt 138 lb 6.4 oz (62.8 kg)   SpO2 96%   BMI 21.04 kg/m   BP Readings from Last 3 Encounters:  08/13/23 136/76  01/30/23 130/82  01/21/23 118/70    Wt Readings from Last 3 Encounters:  08/13/23 138 lb 6.4 oz (62.8 kg)  01/30/23 138 lb (62.6 kg)  12/30/22 139 lb (63 kg)    Physical Exam  Lab Results  Component Value Date   WBC 2.9 (L) 08/13/2023   HGB 14.2 08/13/2023   HCT 42.1 08/13/2023   PLT 226.0 08/13/2023   GLUCOSE 90 08/13/2023   CHOL 226 (H) 08/13/2023   TRIG 65.0 08/13/2023   HDL 108.50 08/13/2023   LDLCALC 104 (H) 08/13/2023   ALT 20 08/13/2023   AST 23 08/13/2023   NA 140 08/13/2023   K 4.3 08/13/2023   CL 101 08/13/2023   CREATININE 0.82 08/13/2023   BUN 24 (H) 08/13/2023   CO2 33 (H) 08/13/2023   TSH 1.82 08/13/2023    No results found.  Assessment & Plan:  Estrogen deficiency -  DG Bone Density; Future  Screening mammogram for breast cancer -     Digital Screening Mammogram, Left and Right; Future  Hyperlipidemia LDL goal <130 -     Lipid panel; Future -     TSH; Future -     Hepatic function panel; Future  Acquired hypothyroidism -     Lipid panel; Future -     CBC with Differential/Platelet; Future -     TSH; Future  Encounter for general adult medical examination with abnormal findings  Primary hypertension -     Basic metabolic panel with GFR; Future -     CBC with Differential/Platelet; Future -     Urinalysis, Routine w reflex microscopic; Future     Follow-up: Return in about 6 months (around 02/12/2024).  Sandra Crouch, MD

## 2023-08-26 ENCOUNTER — Ambulatory Visit: Payer: Self-pay

## 2023-08-26 NOTE — Telephone Encounter (Signed)
 Copied from CRM [id number]. Topic: Clinical - Red Word Triage >> Aug 26, 2023  4:30 PM Howard Macho wrote: Red Word that prompted transfer to Nurse Triage: patient called stating she is having balancing issues and in the past she has had vertigo. Patient states she does not want to got urgent care   Chief Complaint: Dizziness Symptoms: Dizziness/head spinning  Frequency: Intermittent  Disposition: [] ED /[] Urgent Care (no appt availability in office) / [x] Appointment(In office/virtual)/ []  [facility]/ [] Home Care/ [] Refused Recommended Disposition /[] [facility]/ []  Follow-up with PCP Additional Notes: Patient reports she has been experiencing intermittent dizziness for the last 3-4 weeks. She states she will sometimes feel like her head is spinning and that she will sometimes feel a little unsteady on her feet. She reports a history of similar symptoms when she was told she had vertigo due to fluid on her ears. Appointment made for the patient tomorrow for evaluation. Patient instructed to call back for new or worsening symptoms. Patient verbalized understanding and agreement with this plan.     Reason for Disposition  [1] MODERATE dizziness (e.g., vertigo; feels very unsteady, interferes with normal activities) AND [2] has NOT been evaluated by doctor (or NP/PA) for this  Answer Assessment - Initial Assessment Questions 1. DESCRIPTION: "Describe your dizziness."     Head is spinning  2. VERTIGO: "Do you feel like either you or the room is spinning or tilting?"      Yes 3. LIGHTHEADED: "Do you feel lightheaded?" (e.g., somewhat faint, woozy, weak upon standing)     No 4. SEVERITY: "How bad is it?"  "Can you walk?"   - MILD: Feels slightly dizzy and unsteady, but is walking normally.   - MODERATE: Feels unsteady when walking, but not falling; interferes with normal activities (e.g., school, work).   - SEVERE: Unable to walk without falling, or requires assistance to walk  without falling.     Intermittent  5. ONSET:  "When did the dizziness begin?"     Mild to moderate  6. AGGRAVATING FACTORS: "Does anything make it worse?" (e.g., standing, change in head position)     Walking  7. CAUSE: "What do you think is causing the dizziness?"     Was told she had vertigo due to fluid on an ear  8. RECURRENT SYMPTOM: "Have you had dizziness before?" If Yes, ask: "When was the last time?" "What happened that time?"     Yes 9. OTHER SYMPTOMS: "Do you have any other symptoms?" (e.g., headache, weakness, numbness, vomiting, earache)     No  Protocols used: Dizziness - Vertigo-A-AH

## 2023-08-27 ENCOUNTER — Ambulatory Visit (INDEPENDENT_AMBULATORY_CARE_PROVIDER_SITE_OTHER): Admitting: Internal Medicine

## 2023-08-27 VITALS — BP 120/74 | HR 55 | Temp 98.0°F | Ht 68.0 in | Wt 138.0 lb

## 2023-08-27 DIAGNOSIS — R42 Dizziness and giddiness: Secondary | ICD-10-CM | POA: Diagnosis not present

## 2023-08-27 DIAGNOSIS — J309 Allergic rhinitis, unspecified: Secondary | ICD-10-CM | POA: Diagnosis not present

## 2023-08-27 DIAGNOSIS — H6123 Impacted cerumen, bilateral: Secondary | ICD-10-CM | POA: Diagnosis not present

## 2023-08-27 DIAGNOSIS — M79641 Pain in right hand: Secondary | ICD-10-CM | POA: Diagnosis not present

## 2023-08-27 DIAGNOSIS — H6993 Unspecified Eustachian tube disorder, bilateral: Secondary | ICD-10-CM | POA: Insufficient documentation

## 2023-08-27 DIAGNOSIS — M25512 Pain in left shoulder: Secondary | ICD-10-CM | POA: Diagnosis not present

## 2023-08-27 NOTE — Assessment & Plan Note (Signed)
 Also to add astelin nasal asd,  to f/u any worsening symptoms or concerns

## 2023-08-27 NOTE — Progress Notes (Signed)
 Patient ID: Susan Davila, female   DOB: Nov 03, 1950, 73 y.o.   MRN: [id number]        Chief Complaint: follow up bilateral cerumen, vertigo, allergy and bilateral eustachian dysfxn        HPI:  Susan Davila is a 73 y.o. female here with c/o reduced Davila bilateral ears with wax, mild intermittent vertigo with head movement in the past 3 days, though none today, bilat ear fullness and Does have several wks ongoing nasal allergy symptoms with clearish congestion, itch and sneezing, without fever, pain, ST, cough, swelling or wheezing.  Pt denies chest pain, increased sob or doe, wheezing, orthopnea, PND, increased LE swelling, palpitations, dizziness or syncope.   Pt denies fever, wt loss, night sweats, loss of appetite, or other constitutional symptoms  Did have desensitization allergy shots years ago in the 80s and did seem to help at the time.         Wt Readings from Last 3 Encounters:  08/27/23 138 lb (62.6 kg)  08/13/23 138 lb 6.4 oz (62.8 kg)  01/30/23 138 lb (62.6 kg)   BP Readings from Last 3 Encounters:  08/27/23 120/74  08/13/23 136/76  01/30/23 130/82         Past Medical History:  Diagnosis Date   Primary hypertension 07/28/2022   Thyroid  disease    Past Surgical History:  Procedure Laterality Date   BARTHOLIN CYST MARSUPIALIZATION     EYE SURGERY     LIPOSUCTION     TOTAL SHOULDER ARTHROPLASTY Left 03/05/2021   Procedure: LEFT REVERSE TOTAL SHOULDER ARTHROPLASTY;  Surgeon: Jasmine Mesi, MD;  Location: [facility] OR;  Service: Orthopedics;  Laterality: Left;   wisdom teeth Bilateral     reports that she quit smoking about 44 years ago. Her smoking use included cigarettes. She has never been exposed to tobacco smoke. She has never used smokeless tobacco. She reports current alcohol use of about 20.0 standard drinks of alcohol per week. She reports that she does not use drugs. family history includes Alcohol abuse (age of onset: 60) in her father; Dementia (age of onset: 27)  in her mother. No Known Allergies Current Outpatient Medications on File Prior to Visit  Medication Sig Dispense Refill   levothyroxine  (SYNTHROID ) 25 MCG tablet TAKE 1 TABLET(25 MCG) BY MOUTH DAILY BEFORE BREAKFAST 90 tablet 0   torsemide  (DEMADEX ) 5 MG tablet Take 1 tablet (5 mg total) by mouth daily. Schedule an appt for further refills 90 tablet 1   No current facility-administered medications on file prior to visit.        ROS:  All others reviewed and negative.  Objective        PE:  BP 120/74 (BP Location: Right Arm, Patient Position: Sitting, Cuff Size: Normal)   Pulse (!) 55   Temp 98 F (36.7 C) (Oral)   Ht 5\' 8"  (1.727 m)   Wt 138 lb (62.6 kg)   SpO2 99%   BMI 20.98 kg/m                 Constitutional: Pt appears in NAD               HENT: Head: NCAT.                Right Ear: External ear normal.  Bilat tm's with mild erythema.  Max sinus areas non tender.  Pharynx with mild erythema, no exudate  Left Ear: External ear normal. Bilateral cerumen impations resolved               Eyes: . Pupils are equal, round, and reactive to light. Conjunctivae and EOM are normal               Nose: without d/c or deformity               Neck: Neck supple. Gross normal ROM               Cardiovascular: Normal rate and regular rhythm.                 Pulmonary/Chest: Effort normal and breath sounds without rales or wheezing.                Abd:  Soft, NT, ND, + BS, no organomegaly               Neurological: Pt is alert. At baseline orientation, motor grossly intact               Skin: Skin is warm. No rashes, no other new lesions, LE edema - none               Psychiatric: Pt behavior is normal without agitation   Micro: none  Cardiac tracings I have personally interpreted today:  none  Pertinent Radiological findings (summarize): none   Lab Results  Component Value Date   WBC 2.9 (L) 08/13/2023   HGB 14.2 08/13/2023   HCT 42.1 08/13/2023   PLT 226.0 08/13/2023    GLUCOSE 90 08/13/2023   CHOL 226 (H) 08/13/2023   TRIG 65.0 08/13/2023   HDL 108.50 08/13/2023   LDLCALC 104 (H) 08/13/2023   ALT 20 08/13/2023   AST 23 08/13/2023   NA 140 08/13/2023   K 4.3 08/13/2023   CL 101 08/13/2023   CREATININE 0.82 08/13/2023   BUN 24 (H) 08/13/2023   CO2 33 (H) 08/13/2023   TSH 1.82 08/13/2023   Assessment/Plan:  Susan Davila is a 73 y.o. White or Caucasian [1] female with  has a past medical history of Primary hypertension (07/28/2022) and Thyroid  disease.  Bilateral impacted cerumen Resovled, Davila improved,  to f/u any worsening symptoms or concerns   Vertigo Mild intermittent over last 3 days new onset, likely peripheral, pt declines meclizine for now  ETD (Eustachian tube dysfunction), bilateral Ok for mucinex  bid prn  Allergic rhinitis Also to add astelin nasal asd,  to f/u any worsening symptoms or concerns  Followup: Return if symptoms worsen or fail to improve.  Susan Colonel, MD 08/27/2023 8:04 PM [facility]

## 2023-08-27 NOTE — Assessment & Plan Note (Signed)
 Mild intermittent over last 3 days new onset, likely peripheral, pt declines meclizine for now

## 2023-08-27 NOTE — Patient Instructions (Addendum)
 Your ears were cleared of wax today  Please take all new medication as prescribed - the astelin nasal spray  You can also take OTC Nasacort  and/or Allegra 180 mg per day as well  You can also take Mucinex  (or it's generic off brand) for congestion, and tylenol  as needed for pain.  Please let us  know if you would want the meclizine for vertigo if it returns  Please continue all other medications as before, and refills have been done if requested.  Please have the pharmacy call with any other refills you may need.  Please keep your appointments with your specialists as you may have planned

## 2023-08-27 NOTE — Assessment & Plan Note (Signed)
 Resovled, hearing improved,  to f/u any worsening symptoms or concerns

## 2023-08-27 NOTE — Assessment & Plan Note (Signed)
Ok for mucinex bid prn °

## 2023-08-28 ENCOUNTER — Ambulatory Visit
Admission: RE | Admit: 2023-08-28 | Discharge: 2023-08-28 | Disposition: A | Discharge status: Home / Self Care | Source: Ambulatory Visit | Attending: Internal Medicine | Admitting: Internal Medicine

## 2023-08-28 DIAGNOSIS — Z1231 Encounter for screening mammogram for malignant neoplasm of breast: Secondary | ICD-10-CM | POA: Diagnosis not present

## 2023-09-09 DIAGNOSIS — L820 Inflamed seborrheic keratosis: Secondary | ICD-10-CM | POA: Diagnosis not present

## 2023-09-29 ENCOUNTER — Ambulatory Visit (INDEPENDENT_AMBULATORY_CARE_PROVIDER_SITE_OTHER)

## 2023-09-29 ENCOUNTER — Encounter (HOSPITAL_COMMUNITY): Payer: Self-pay

## 2023-09-29 ENCOUNTER — Ambulatory Visit (HOSPITAL_COMMUNITY)
Admission: RE | Admit: 2023-09-29 | Discharge: 2023-09-29 | Disposition: A | Discharge status: Home / Self Care | Source: Ambulatory Visit | Attending: Emergency Medicine | Admitting: Emergency Medicine

## 2023-09-29 VITALS — BP 136/77 | HR 63 | Temp 98.0°F | Resp 18

## 2023-09-29 DIAGNOSIS — S6991XA Unspecified injury of right wrist, hand and finger(s), initial encounter: Secondary | ICD-10-CM | POA: Diagnosis not present

## 2023-09-29 DIAGNOSIS — S63501A Unspecified sprain of right wrist, initial encounter: Secondary | ICD-10-CM

## 2023-09-29 DIAGNOSIS — Z043 Encounter for examination and observation following other accident: Secondary | ICD-10-CM | POA: Diagnosis not present

## 2023-09-29 NOTE — ED Provider Notes (Signed)
 [facility]    CSN: [id number] Arrival date & time: 09/29/23  1541      History   Chief Complaint Chief Complaint  Patient presents with   Wrist Pain    Susan Davila down walking dog  Saturday Want to make sure it's sprained and not something else like a hairline fracture - Entered by patient   Appointment    16:00    HPI Susan Davila is a 73 y.o. female.  Walking her dog 3 days ago, tripped and fell.  She caught herself with both hands.  She did not hit her head or lose consciousness.  Having some pain in the right wrist with certain movements like starting her car.  The left wrist was hurting at first but now is not bothering her.  She denies pain elsewhere Has been taking ibuprofen   Past Medical History:  Diagnosis Date   Primary hypertension 07/28/2022   Thyroid  disease     Patient Active Problem List   Diagnosis Date Noted   Bilateral impacted cerumen 08/27/2023   Vertigo 08/27/2023   ETD (Eustachian tube dysfunction), bilateral 08/27/2023   Allergic rhinitis 08/27/2023   Screening mammogram for breast cancer 08/13/2023   Encounter for general adult medical examination with abnormal findings 08/13/2023   Primary hypertension 07/28/2022   Estrogen deficiency 03/27/2022   Acquired hypothyroidism 07/13/2019   Hyperlipidemia LDL goal <130 07/13/2019    Past Surgical History:  Procedure Laterality Date   BARTHOLIN CYST MARSUPIALIZATION     EYE SURGERY     LIPOSUCTION     TOTAL SHOULDER ARTHROPLASTY Left 03/05/2021   Procedure: LEFT REVERSE TOTAL SHOULDER ARTHROPLASTY;  Surgeon: Susan Mesi, MD;  Location: [facility] OR;  Service: Orthopedics;  Laterality: Left;   wisdom teeth Bilateral     OB History   No obstetric history on file.      Home Medications    Prior to Admission medications   Medication Sig Start Date End Date Taking? Authorizing Provider  levothyroxine  (SYNTHROID ) 25 MCG tablet TAKE 1 TABLET(25 MCG) BY MOUTH DAILY BEFORE BREAKFAST  08/13/23   Susan Knuckles, MD  torsemide  (DEMADEX ) 5 MG tablet Take 1 tablet (5 mg total) by mouth daily. Schedule an appt for further refills 08/13/23   Susan Knuckles, MD    Family History Family History  Problem Relation Age of Onset   Dementia Mother 60   Alcohol abuse Father 3    Social History Social History   Tobacco Use   Smoking status: Former    Current packs/day: 0.00    Types: Cigarettes    Quit date: 09/15/1978    Years since quitting: 45.0    Passive exposure: Never   Smokeless tobacco: Never  Vaping Use   Vaping status: Never Used  Substance Use Topics   Alcohol use: Yes    Alcohol/week: 20.0 standard drinks of alcohol    Types: 20 Glasses of wine per week   Drug use: Never     Allergies   Patient has no known allergies.   Review of Systems Review of Systems  As per HPI  Physical Exam Triage Vital Signs ED Triage Vitals  Encounter Vitals Group     BP 09/29/23 1621 136/77     Systolic BP Percentile --      Diastolic BP Percentile --      Pulse Rate 09/29/23 1621 63     Resp 09/29/23 1621 18     Temp 09/29/23 1621 98 F (36.7  C)     Temp Source 09/29/23 1621 Oral     SpO2 09/29/23 1621 98 %     Weight --      Height --      Head Circumference --      Peak Flow --      Pain Score 09/29/23 1619 0     Pain Loc --      Pain Education --      Exclude from Growth Chart --    No data found.  Updated Vital Signs BP 136/77 (BP Location: Left Arm)   Pulse 63   Temp 98 F (36.7 C) (Oral)   Resp 18   SpO2 98%   Visual Acuity Right Eye Distance:   Left Eye Distance:   Bilateral Distance:    Right Eye Near:   Left Eye Near:    Bilateral Near:     Physical Exam Vitals and nursing note reviewed.  Constitutional:      General: She is not in acute distress. HENT:     Mouth/Throat:     Pharynx: Oropharynx is clear.  Cardiovascular:     Rate and Rhythm: Normal rate and regular rhythm.     Pulses: Normal pulses.     Heart sounds:  Normal heart sounds.  Pulmonary:     Effort: Pulmonary effort is normal.     Breath sounds: Normal breath sounds.  Musculoskeletal:     Cervical back: Normal range of motion.     Comments: Full ROM of bilateral upper extremities. No bony tenderness to palpation. Grip strength intact, sensation normal. Cap refill < 2 seconds. Radial pulses 2+  Skin:    General: Skin is warm and dry.     Capillary Refill: Capillary refill takes less than 2 seconds.     Comments: Small 1 cm diameter abrasion on the right ulnar wrist ventrally  Neurological:     Mental Status: She is alert and oriented to person, place, and time.     UC Treatments / Results  Labs (all labs ordered are listed, but only abnormal results are displayed) Labs Reviewed - No data to display  EKG  Radiology DG Wrist Complete Right Result Date: 09/29/2023 CLINICAL DATA:  Status post fall fall 3 days ago EXAM: RIGHT WRIST - COMPLETE 3+ VIEW COMPARISON:  None Available. FINDINGS: There is no evidence of fracture or dislocation. There is no evidence of arthropathy or other focal bone abnormality. Soft tissues are unremarkable. IMPRESSION: Negative. Electronically Signed   By: Susan Davila M.D.   On: 09/29/2023 16:54    Procedures Procedures   Medications Ordered in UC Medications - No data to display  Initial Impression / Assessment and Plan / UC Course  I have reviewed the triage vital signs and the nursing notes.  Pertinent labs & imaging results that were available during my care of the patient were reviewed by me and considered in my medical decision making (see chart for details).  Right wrist xray without bony abnormality. Images independently reviewed by me, agree with radiology interpretation. Discussed likely sprain. Brace is provided for support. RICE therapy and pain control.  Advised can continue ibuprofen , or try Tylenol  650 mg every 6 hours. She does have an orthopedic specialist Susan Davila) and can follow with  them if needed  Final Clinical Impressions(s) / UC Diagnoses   Final diagnoses:  Injury of right wrist, initial encounter  Sprain of right wrist, initial encounter     Discharge Instructions  There are no broken bones on your xray! You likely have sprained the wrist. You can wear the brace as often as needed for support. I do recommend to ice and elevate the wrists and hands as well. Continue tylenol  or ibuprofen  for pain as needed.   Please follow up with your orthopedic specialist if you are having persisting symtpoms   ED Prescriptions   None    PDMP not reviewed this encounter.   Shebra Muldrow, Ivette Marks, [place] 09/29/23 1704

## 2023-09-29 NOTE — ED Triage Notes (Signed)
 Reports she fell down while walking her dog Saturday.   Patient landed on both palms of hands.  Patient is right handed.  Denies swelling .  Reports right hand is particularly sore.  Upper chest soreness.  Did not hit head. Patient does have bruising.  Bruise to left palm, small scrape to right palm.  Notices pain with turning keys in ignition or similar movement  Patient has taken ibuprofen .

## 2023-09-29 NOTE — Discharge Instructions (Addendum)
 There are no broken bones on your xray! You likely have sprained the wrist. You can wear the brace as often as needed for support. I do recommend to ice and elevate the wrists and hands as well. Continue tylenol  or ibuprofen  for pain as needed.   Please follow up with your orthopedic specialist if you are having persisting symtpoms

## 2023-10-26 ENCOUNTER — Other Ambulatory Visit (INDEPENDENT_AMBULATORY_CARE_PROVIDER_SITE_OTHER): Payer: Self-pay

## 2023-10-26 ENCOUNTER — Ambulatory Visit: Admitting: Orthopedic Surgery

## 2023-10-26 DIAGNOSIS — M25511 Pain in right shoulder: Secondary | ICD-10-CM

## 2023-10-26 DIAGNOSIS — M25512 Pain in left shoulder: Secondary | ICD-10-CM

## 2023-10-28 ENCOUNTER — Encounter: Payer: Self-pay | Admitting: Orthopedic Surgery

## 2023-10-28 NOTE — Progress Notes (Signed)
 Office Visit Note   Patient: Susan Davila           Date of Birth: 20-May-1950           MRN: [id number] Visit Date: 10/26/2023 Requested by: Joshua Debby CROME, MD [address] [place],  [place] [address] PCP: Joshua Debby CROME, MD  Subjective: Chief Complaint  Patient presents with   Other    Follow up bilateral wrist and shoulder pain s/p fall 1 month ago.     HPI: Susan Davila is a 73 y.o. female who presents to the office reporting bilateral shoulder pain and right wrist pain.  Patient had a fall 1 month ago while walking her dog.  Fell onto her outstretched arm.  Went to urgent care and was told she had right wrist negative for fracture.  She has had a left reverse shoulder replacement performed in the past.  Has taken ibuprofen  for pain without much relief.  She is very active and likes to workout.  Right wrist had no swelling at the time.  She reports both right and left shoulder pain following the fall.  Brace made it worse on the wrist..                ROS: All systems reviewed are negative as they relate to the chief complaint within the history of present illness.  Patient denies fevers or chills.  Assessment & Plan: Visit Diagnoses:  1. Acute pain of both shoulders     Plan: Impression is no obvious fracture in the wrist.  Her range of motion is good but she has a slight amount of swelling.  I think at this time would be good for her to discontinue the splint.  Does have some arthritis around that Scaife O trapezial joint.  TFCC pathology also a possibility but less likely.  I think if she has persistent symptoms after 6 weeks of being out of the splint we could consider MRI scanning and diagnostic and therapeutic injections in around the wrist.  Both shoulders looked unchanged in terms of on radiographs and exam.  Rotator cuff strength is intact on the right-hand side.  Left reverse shoulder replacement is functional with some diminished external rotation which is not new.   No fractures present.  We can follow those along 2 but no active intervention required at this time for the shoulders.  Follow-Up Instructions: No follow-ups on file.   Orders:  Orders Placed This Encounter  Procedures   XR Shoulder Right   XR Shoulder Left   No orders of the defined types were placed in this encounter.     Procedures: No procedures performed   Clinical Data: No additional findings.  Objective: Vital Signs: There were no vitals taken for this visit.  Physical Exam:  Constitutional: Patient appears well-developed HEENT:  Head: Normocephalic Eyes:EOM are normal Neck: Normal range of motion Cardiovascular: Normal rate Pulmonary/chest: Effort normal Neurologic: Patient is alert Skin: Skin is warm Psychiatric: Patient has normal mood and affect  Ortho Exam: Ortho exam demonstrates symmetric grip strength bilaterally.  No snuffbox tenderness or radial styloid tenderness on the right-hand side.  EPL FPL interosseous function is intact.  Has pretty symmetric wrist flexion and extension.  Does have some radial sided tenderness as well as ulnar TFCC tenderness but no subluxation of ECu with pronation supination of the right wrist..  Radial pulses intact.  Right shoulder has full active and passive range of motion with good rotator cuff  strength infraspinatusand subscap muscle testing.  Negative grind testing.  No discrete AC joint tenderness.  Cervical spine range of motion intact.  Left shoulder has good deltoid function and good passive range of motion with no grinding or crepitus.  Still has about 15 to 20 degrees of external rotation but good external rotation strength as well as subscap strength.  Specialty Comments:  No specialty comments available.  Imaging: No results found.   PMFS History: Patient Active Problem List   Diagnosis Date Noted   Bilateral impacted cerumen 08/27/2023   Vertigo 08/27/2023   ETD (Eustachian tube dysfunction), bilateral  08/27/2023   Allergic rhinitis 08/27/2023   Screening mammogram for breast cancer 08/13/2023   Encounter for general adult medical examination with abnormal findings 08/13/2023   Primary hypertension 07/28/2022   Estrogen deficiency 03/27/2022   Acquired hypothyroidism 07/13/2019   Hyperlipidemia LDL goal <130 07/13/2019   Past Medical History:  Diagnosis Date   Primary hypertension 07/28/2022   Thyroid  disease     Family History  Problem Relation Age of Onset   Dementia Mother 98   Alcohol abuse Father 49    Past Surgical History:  Procedure Laterality Date   BARTHOLIN CYST MARSUPIALIZATION     EYE SURGERY     LIPOSUCTION     TOTAL SHOULDER ARTHROPLASTY Left 03/05/2021   Procedure: LEFT REVERSE TOTAL SHOULDER ARTHROPLASTY;  Surgeon: Addie Cordella Hamilton, MD;  Location: [facility] OR;  Service: Orthopedics;  Laterality: Left;   wisdom teeth Bilateral    Social History   Occupational History   Not on file  Tobacco Use   Smoking status: Former    Current packs/day: 0.00    Types: Cigarettes    Quit date: 09/15/1978    Years since quitting: 45.1    Passive exposure: Never   Smokeless tobacco: Never  Vaping Use   Vaping status: Never Used  Substance and Sexual Activity   Alcohol use: Yes    Alcohol/week: 20.0 standard drinks of alcohol    Types: 20 Glasses of wine per week   Drug use: Never   Sexual activity: Yes    Partners: Male    Birth control/protection: None

## 2023-11-04 ENCOUNTER — Ambulatory Visit: Admitting: Orthopedic Surgery

## 2023-11-05 ENCOUNTER — Other Ambulatory Visit: Payer: Self-pay | Admitting: Internal Medicine

## 2023-11-06 ENCOUNTER — Ambulatory Visit: Admitting: Orthopedic Surgery

## 2023-11-09 ENCOUNTER — Ambulatory Visit: Admitting: Orthopedic Surgery

## 2023-11-12 ENCOUNTER — Other Ambulatory Visit: Payer: Self-pay | Admitting: Internal Medicine

## 2023-11-12 DIAGNOSIS — E039 Hypothyroidism, unspecified: Secondary | ICD-10-CM

## 2023-12-18 ENCOUNTER — Telehealth: Payer: Self-pay

## 2023-12-18 NOTE — Telephone Encounter (Signed)
 Copied from CRM [id number]. Topic: Appointments - Transfer of Care >> Dec 01, 2023 10:42 AM Chasity T wrote: Pt is requesting to transfer FROM: Debby Molt Pt is requesting to transfer TO: Boby Mackintosh Reason for requested transfer: prefer a woman's doctor It is the responsibility of the team the patient would like to transfer to (Dr. Boby Mackintosh) to reach out to the patient if for any reason this transfer is not acceptable. >> Dec 18, 2023 10:23 AM Diannia H wrote: Patient is calling to check the status of her transfer of care from Dr. Debby to Mrs, Mackintosh NP

## 2023-12-18 NOTE — Telephone Encounter (Signed)
 LM for pt Susan Davila is unable to take on pt

## 2024-01-01 ENCOUNTER — Ambulatory Visit

## 2024-01-06 ENCOUNTER — Other Ambulatory Visit: Payer: Self-pay | Admitting: Internal Medicine

## 2024-01-06 DIAGNOSIS — E039 Hypothyroidism, unspecified: Secondary | ICD-10-CM

## 2024-01-07 ENCOUNTER — Telehealth: Payer: Self-pay | Admitting: Radiology

## 2024-01-07 NOTE — Telephone Encounter (Signed)
 Copied from CRM [id number]. Topic: General - Other >> Jan 07, 2024  2:29 PM Susan Davila wrote: Reason for CRM: Patient is calling in to let the provider know the patient is scheduled for a bone density scan for 04/2024. Patient is asking for them to please stop calling as she has one scheduled already.

## 2024-01-08 NOTE — Telephone Encounter (Signed)
 Noted

## 2024-01-13 IMAGING — MG MM DIGITAL DIAGNOSTIC UNILAT*R* W/ TOMO W/ CAD
4 series · 4 of 12 positions shown · non-contrast
Comparison: Current screening study, 02/20/2021.

CLINICAL DATA: Screening recall for possible right breast mass.
Patient's prior study from 12/24/2016 was not available at the time
of the screening interpretation. These images were retrieved shortly
after the diagnostic exam and breast ultrasound were performed.

EXAM:
DIGITAL DIAGNOSTIC UNILATERAL RIGHT MAMMOGRAM WITH TOMOSYNTHESIS AND
CAD; ULTRASOUND RIGHT BREAST LIMITED
TECHNIQUE: Right digital diagnostic mammography and breast tomosynthesis was
performed. The images were evaluated with computer-aided detection.;
Targeted ultrasound examination of the right breast was performed

[R CC synth-2D]
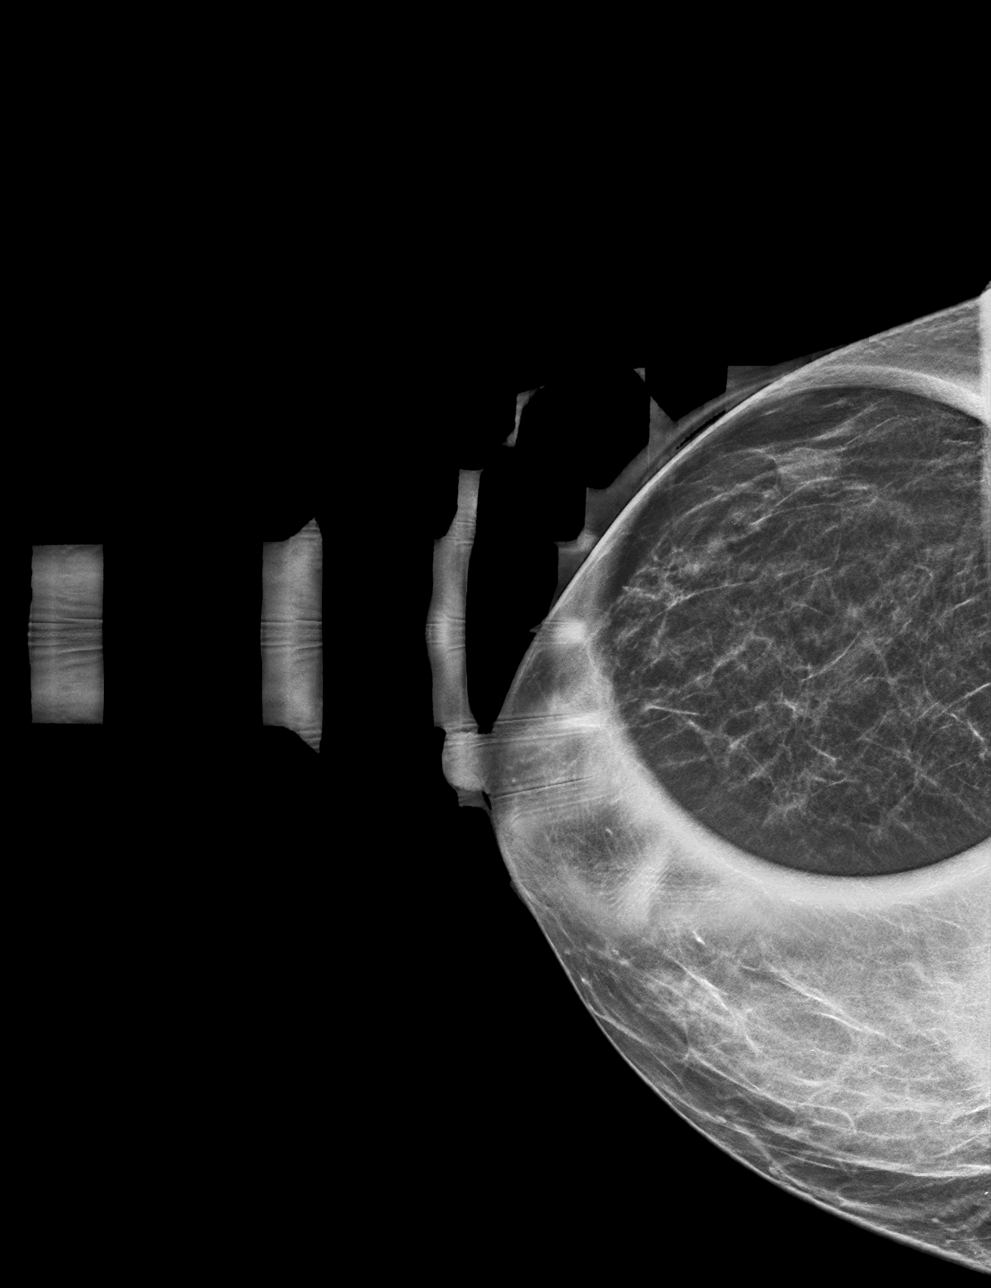

[R MLO synth-2D]
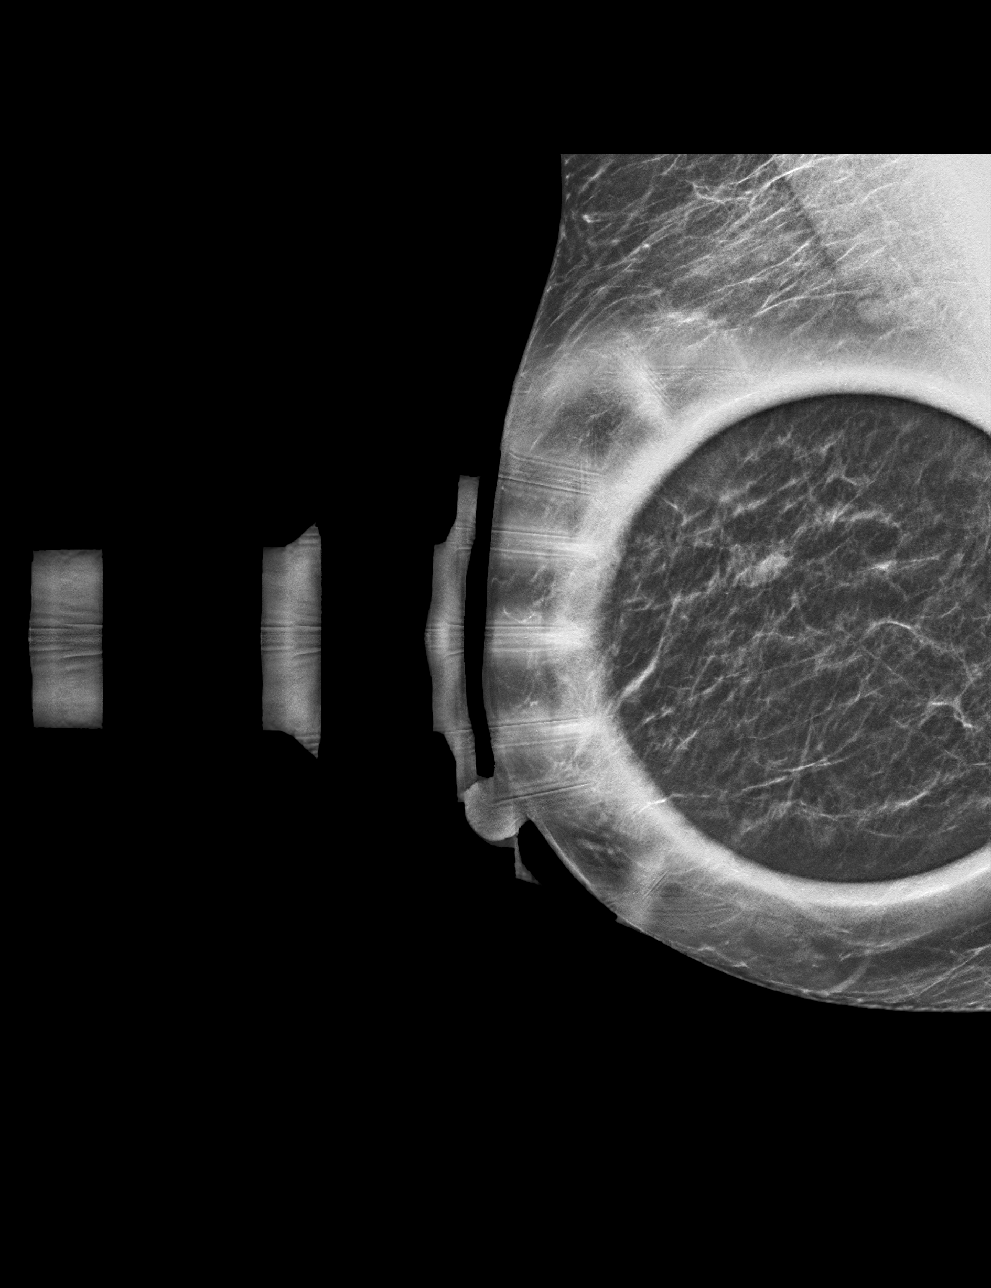

[R CC tomo · tomo slice 21/40.0]
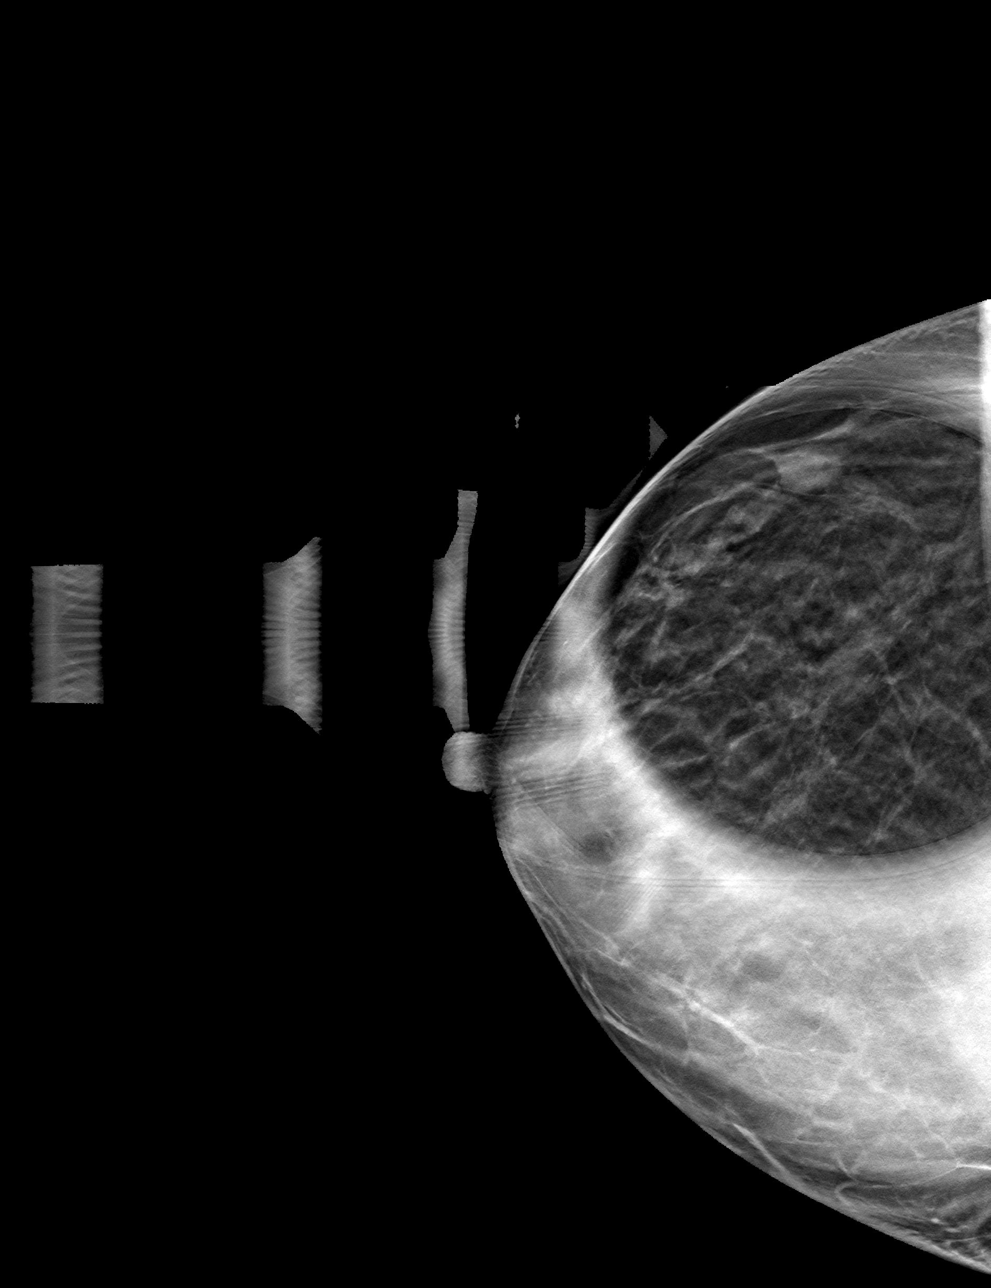

[R MLO tomo · tomo slice 20/39.0]
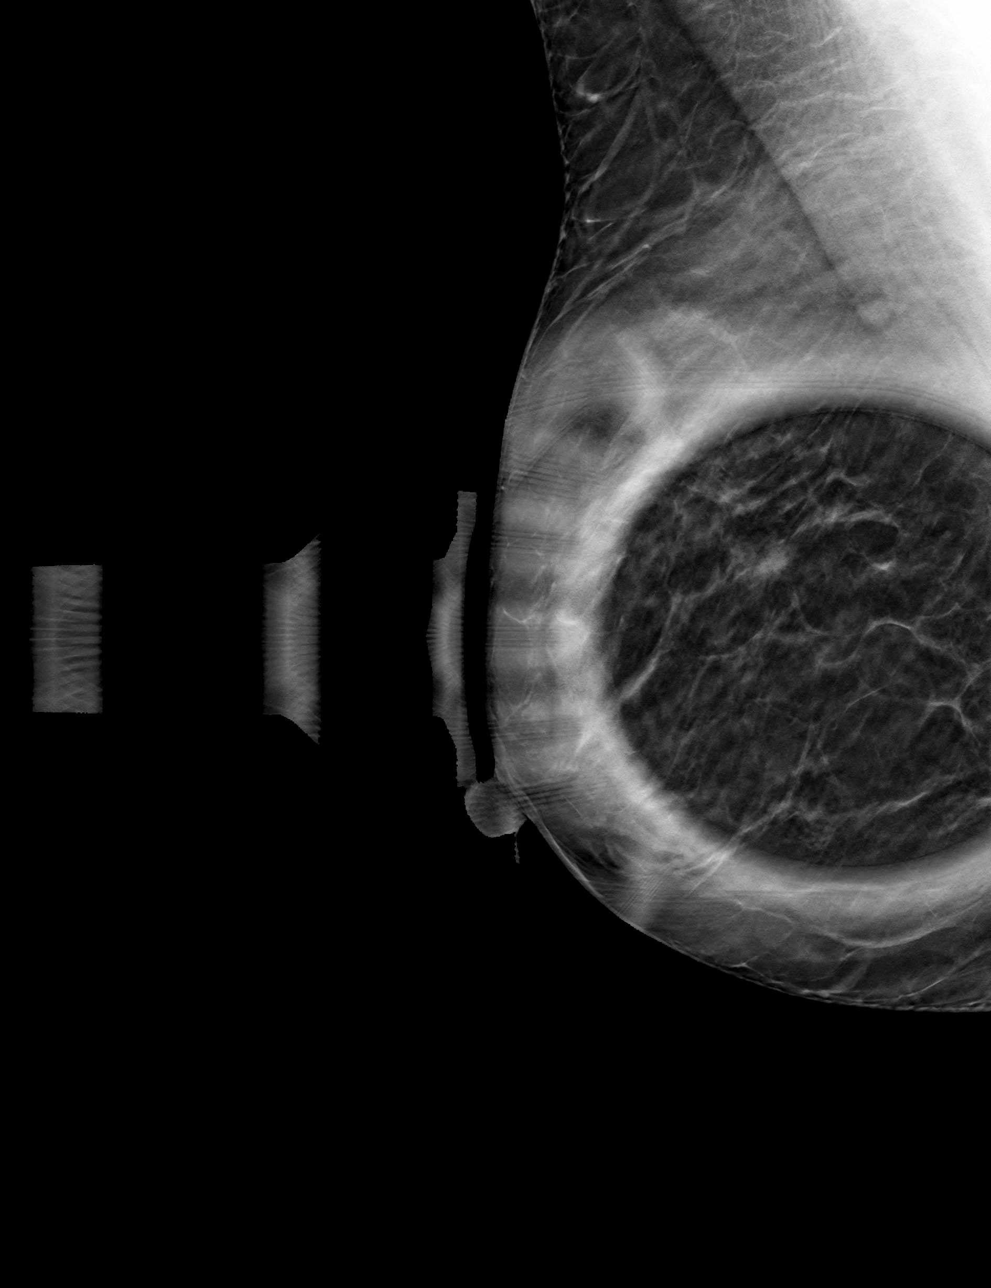

[4 of 12 positions shown; findings below may reference images not displayed]

Prior exam dated
12/24/2016.

ACR Breast Density Category b: There are scattered areas of
fibroglandular density.
FINDINGS: The mass in the lateral right breast persists on the diagnostic
spot-compression images as an oval circumscribed, approximately 1
cm, mass, unchanged from the prior exam. There are no other masses,
no areas of architectural distortion and no suspicious
calcifications.

Targeted right breast ultrasound is performed, showing a hypoechoic,
oval, parallel circumscribed mass at 9 o'clock, 5 cm the nipple,
measuring 1.0 x 0.4 x 0.9 cm, consistent with a fibroadenoma and
consistent with the mammographic mass.
IMPRESSION: 1. No evidence of breast malignancy.
2. Benign right breast mass, stable since 9966, consistent with a
fibroadenoma.

RECOMMENDATION:
Screening mammogram in one year.(Code:V6-P-RAZ)

I have discussed the findings and recommendations with the patient.
If applicable, a reminder letter will be sent to the patient
regarding the next appointment.

BI-RADS CATEGORY  2: Benign.

## 2024-01-13 IMAGING — US US BREAST*R* LIMITED INC AXILLA
1 series · 6 of 6 positions shown · non-contrast
Comparison: Current screening study, 02/20/2021.

CLINICAL DATA: Screening recall for possible right breast mass.
Patient's prior study from 12/24/2016 was not available at the time
of the screening interpretation. These images were retrieved shortly
after the diagnostic exam and breast ultrasound were performed.

EXAM:
DIGITAL DIAGNOSTIC UNILATERAL RIGHT MAMMOGRAM WITH TOMOSYNTHESIS AND
CAD; ULTRASOUND RIGHT BREAST LIMITED
TECHNIQUE: Right digital diagnostic mammography and breast tomosynthesis was
performed. The images were evaluated with computer-aided detection.;
Targeted ultrasound examination of the right breast was performed

[Series 1: us breast*right* limited inc axilla · 0.05mm/px · 6 of 6 slices shown]
[im 1/6]
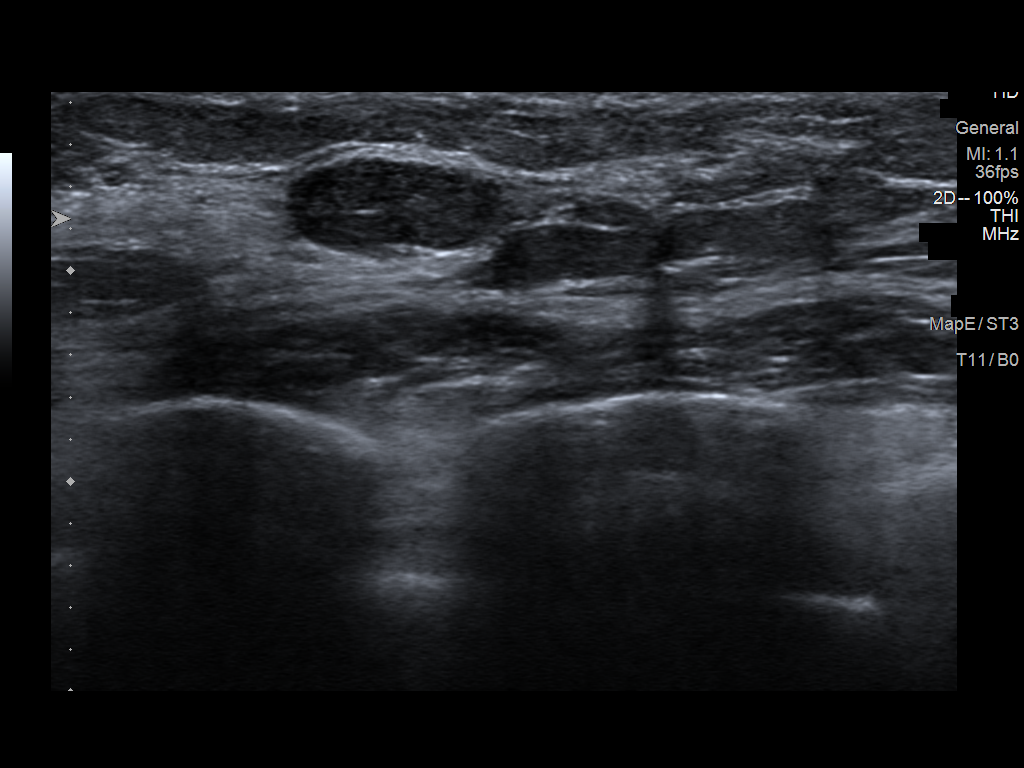
[im 2/6]
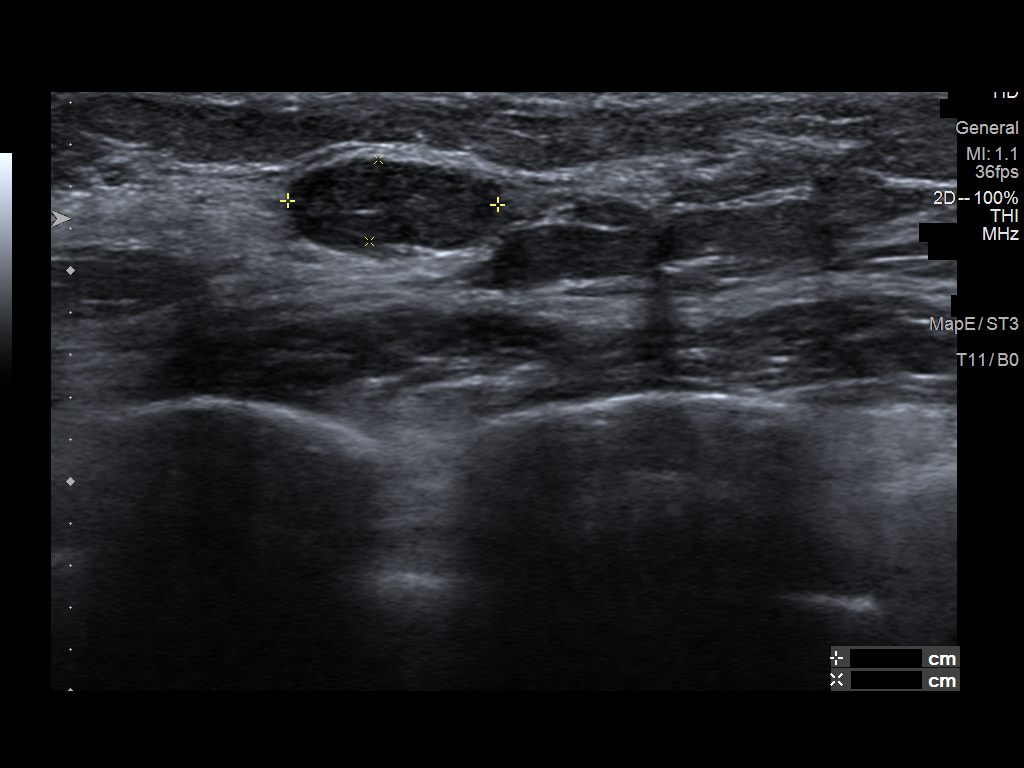
[im 3/6]
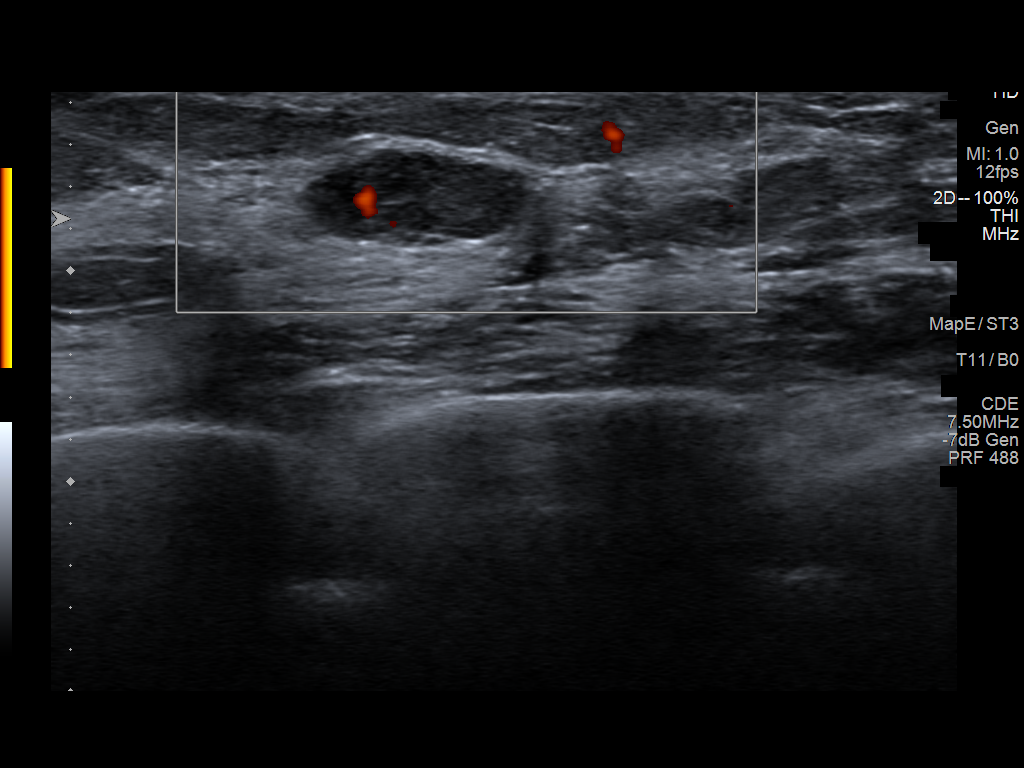
[im 4/6]
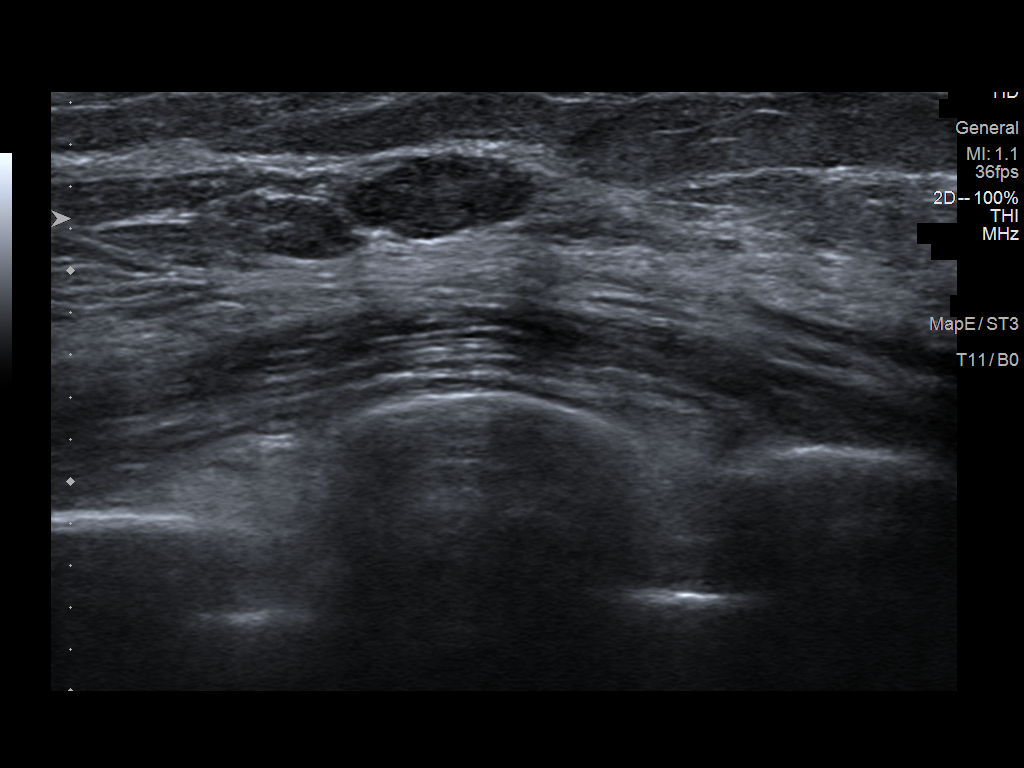
[im 5/6]
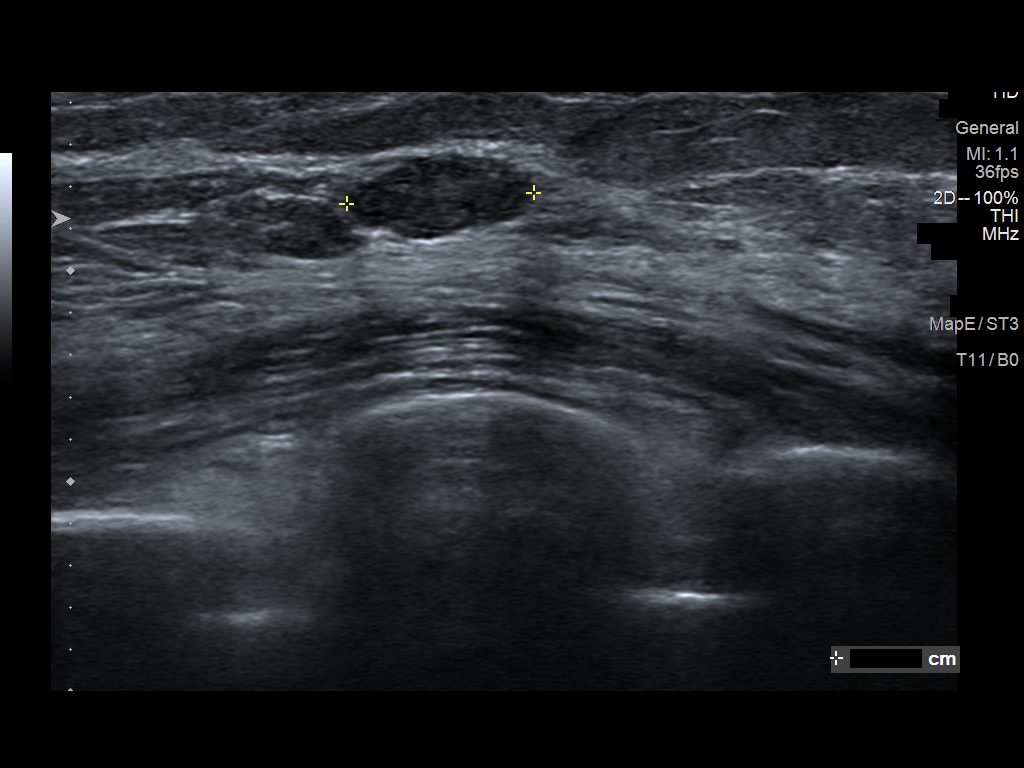
[im 6/6]
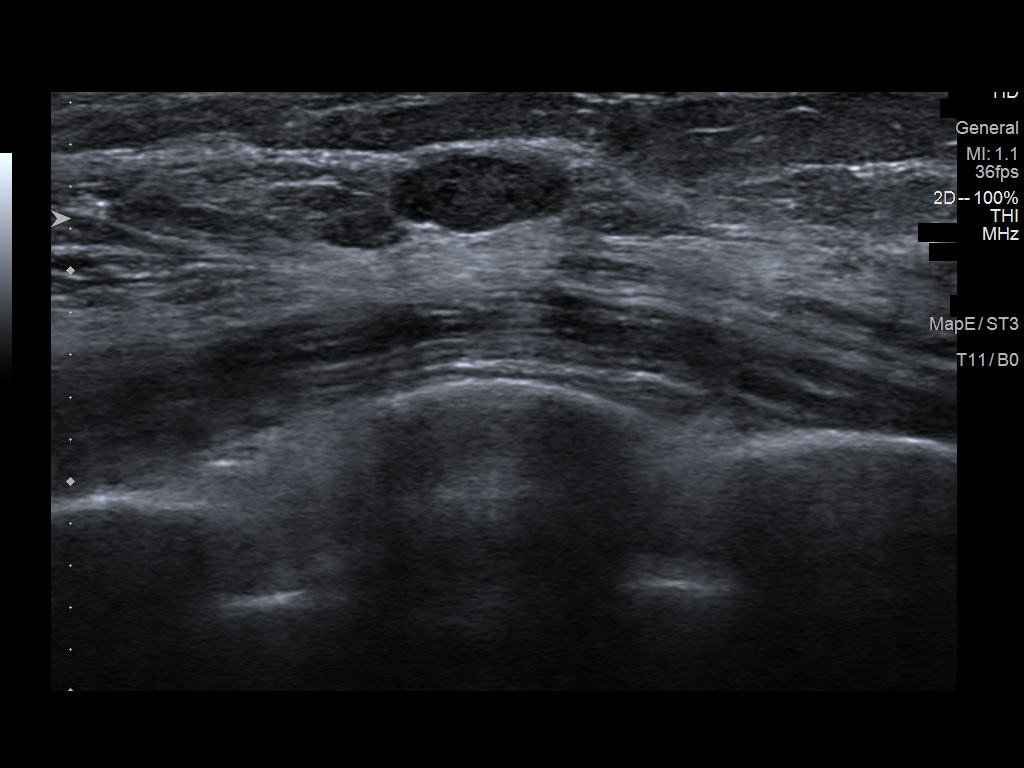

[6 of 6 positions shown; findings below may reference images not displayed]

Prior exam dated
12/24/2016.

ACR Breast Density Category b: There are scattered areas of
fibroglandular density.
FINDINGS: The mass in the lateral right breast persists on the diagnostic
spot-compression images as an oval circumscribed, approximately 1
cm, mass, unchanged from the prior exam. There are no other masses,
no areas of architectural distortion and no suspicious
calcifications.

Targeted right breast ultrasound is performed, showing a hypoechoic,
oval, parallel circumscribed mass at 9 o'clock, 5 cm the nipple,
measuring 1.0 x 0.4 x 0.9 cm, consistent with a fibroadenoma and
consistent with the mammographic mass.
IMPRESSION: 1. No evidence of breast malignancy.
2. Benign right breast mass, stable since 9966, consistent with a
fibroadenoma.

RECOMMENDATION:
Screening mammogram in one year.(Code:V6-P-RAZ)

I have discussed the findings and recommendations with the patient.
If applicable, a reminder letter will be sent to the patient
regarding the next appointment.

BI-RADS CATEGORY  2: Benign.

## 2024-01-27 ENCOUNTER — Ambulatory Visit (INDEPENDENT_AMBULATORY_CARE_PROVIDER_SITE_OTHER)

## 2024-01-27 VITALS — Ht 68.0 in | Wt 138.0 lb

## 2024-01-27 DIAGNOSIS — Z0000 Encounter for general adult medical examination without abnormal findings: Secondary | ICD-10-CM

## 2024-01-27 NOTE — Progress Notes (Signed)
 Subjective:   Susan Davila is a 73 y.o. who presents for a Medicare Wellness preventive visit.  As a reminder, Annual Wellness Visits don't include a physical exam, and some assessments may be limited, especially if this visit is performed virtually. We may recommend an in-person follow-up visit with your provider if needed.  Visit Complete: Virtual I connected with  Susan Davila on 01/27/24 by a audio enabled telemedicine application and verified that I am speaking with the correct person using two identifiers.  Patient Location: Home  Provider Location: Office/Clinic  I discussed the limitations of evaluation and management by telemedicine. The patient expressed understanding and agreed to proceed.  Vital Signs: Because this visit was a virtual/telehealth visit, some criteria may be missing or patient reported. Any vitals not documented were not able to be obtained and vitals that have been documented are patient reported.  VideoDeclined- This patient declined Librarian, academic. Therefore the visit was completed with audio only.  Persons Participating in Visit: Patient.  AWV Questionnaire: Yes: Patient Medicare AWV questionnaire was completed by the patient on 01/26/2024; I have confirmed that all information answered by patient is correct and no changes since this date.  Cardiac Risk Factors include: advanced age (>32men, >78 women);dyslipidemia;hypertension     Objective:    Today's Vitals   01/27/24 1313  Weight: 138 lb (62.6 kg)  Height: 5' 8 (1.727 m)   Body mass index is 20.98 kg/m.     01/27/2024    1:13 PM 03/05/2021    6:07 AM 02/20/2021    3:33 PM 07/09/2020    7:53 AM  Advanced Directives  Does Patient Have a Medical Advance Directive? Yes No No No  Type of Estate agent of [place];Living will     Does patient want to make changes to medical advance directive? No - Patient declined     Copy of Healthcare  Power of Attorney in Chart? Yes - validated most recent copy scanned in chart (See row information)     Would patient like information on creating a medical advance directive?  No - Patient declined No - Patient declined     Current Medications (verified) Outpatient Encounter Medications as of 01/27/2024  Medication Sig   levothyroxine  (SYNTHROID ) 25 MCG tablet TAKE 1 TABLET(25 MCG) BY MOUTH DAILY BEFORE BREAKFAST   torsemide  (DEMADEX ) 5 MG tablet Take 1 tablet (5 mg total) by mouth daily. Schedule an appt for further refills   No facility-administered encounter medications on file as of 01/27/2024.    Allergies (verified) Patient has no known allergies.   History: Past Medical History:  Diagnosis Date   Primary hypertension 07/28/2022   Thyroid  disease    Past Surgical History:  Procedure Laterality Date   BARTHOLIN CYST MARSUPIALIZATION     EYE SURGERY     LIPOSUCTION     TOTAL SHOULDER ARTHROPLASTY Left 03/05/2021   Procedure: LEFT REVERSE TOTAL SHOULDER ARTHROPLASTY;  Surgeon: Addie Cordella Hamilton, MD;  Location: [facility] OR;  Service: Orthopedics;  Laterality: Left;   wisdom teeth Bilateral    Family History  Problem Relation Age of Onset   Dementia Mother 20   Alcohol abuse Father 71   Social History   Socioeconomic History   Marital status: Married    Spouse name: Elsie Solomons   Number of children: 1   Years of education: Not on file   Highest education level: Bachelor's degree (e.g., BA, AB, BS)  Occupational History  Not on file  Tobacco Use   Smoking status: Former    Current packs/day: 0.00    Types: Cigarettes    Quit date: 09/15/1978    Years since quitting: 45.3    Passive exposure: Never   Smokeless tobacco: Never  Vaping Use   Vaping status: Never Used  Substance and Sexual Activity   Alcohol use: Yes    Alcohol/week: 20.0 standard drinks of alcohol    Types: 20 Glasses of wine per week   Drug use: Never   Sexual activity: Yes    Partners: Male     Birth control/protection: None  Other Topics Concern   Not on file  Social History Narrative   ** Merged History Encounter **         Right Handed   Lives in a one story home   Drinks Caffeine        Social Drivers of Health   Financial Resource Strain: Low Risk  (01/27/2024)   Overall Financial Resource Strain (CARDIA)    Difficulty of Paying Living Expenses: Not hard at all  Food Insecurity: No Food Insecurity (01/27/2024)   Hunger Vital Sign    Worried About Running Out of Food in the Last Year: Never true    Ran Out of Food in the Last Year: Never true  Transportation Needs: No Transportation Needs (01/27/2024)   PRAPARE - Administrator, Civil Service (Medical): No    Lack of Transportation (Non-Medical): No  Physical Activity: Sufficiently Active (01/27/2024)   Exercise Vital Sign    Days of Exercise per Week: 5 days    Minutes of Exercise per Session: 60 min  Stress: No Stress Concern Present (01/27/2024)   [employer] of Occupational Health - Occupational Stress Questionnaire    Feeling of Stress: Not at all  Social Connections: Moderately Isolated (01/27/2024)   Social Connection and Isolation Panel    Frequency of Communication with Friends and Family: Twice a week    Frequency of Social Gatherings with Friends and Family: Once a week    Attends Religious Services: Never    Database administrator or Organizations: No    Attends Engineer, structural: Never    Marital Status: Married    Tobacco Counseling Counseling given: Not Answered    Clinical Intake:  Pre-visit preparation completed: Yes  Pain : No/denies pain     BMI - recorded: 20.98 Nutritional Status: BMI of 19-24  Normal Nutritional Risks: None Diabetes: No  No results found for: HGBA1C   How often do you need to have someone help you when you read instructions, pamphlets, or other written materials from your doctor or pharmacy?: 1 - Never  Interpreter Needed?:  No  Information entered by :: Verdie Saba, CMA   Activities of Daily Living     01/27/2024    1:15 PM 01/26/2024    6:12 PM  In your present state of health, do you have any difficulty performing the following activities:  Hearing? 0 0  Vision? 0 0  Difficulty concentrating or making decisions? 0 0  Walking or climbing stairs? 0 0  Dressing or bathing? 0 0  Doing errands, shopping? 0 0  Preparing Food and eating ? N N  Using the Toilet? N N  In the past six months, have you accidently leaked urine? N N  Do you have problems with loss of bowel control? N N  Managing your Medications? N N  Managing your Finances? N  N  Housekeeping or managing your Housekeeping? N N    Patient Care Team: Joshua Debby CROME, MD as PCP - General (Internal Medicine) Patient, No Pcp Per (General Practice) Patel, Donika K, DO as Consulting Physician (Neurology) [place], Heather, OD (Optometry)  I have updated your Care Teams any recent Medical Services you may have received from other providers in the past year.     Assessment:   This is a routine wellness examination for Susan Davila.  Hearing/Vision screen Hearing Screening - Comments:: Denies hearing difficulties   Vision Screening - Comments:: Wears contact lenses - up to date with routine eye exams with [facility] [place]    Goals Addressed               This Visit's Progress     Patient Stated (pt-stated)        Patient stated she plans to staying active - exercising.  Just started a new job.       Depression Screen     01/27/2024    1:16 PM 08/27/2023    3:55 PM 08/13/2023    9:42 AM 03/24/2022    3:56 PM 11/12/2020    3:38 PM 07/13/2019    3:13 PM  PHQ 2/9 Scores  PHQ - 2 Score 0 0 0 0 0 0  PHQ- 9 Score 0   0      Fall Risk     01/27/2024    1:16 PM 01/26/2024    6:12 PM 08/27/2023    4:00 PM 08/13/2023    9:42 AM 03/24/2022    3:56 PM  Fall Risk   Falls in the past year? 0 0 0 0 0  Number falls in past yr: 0 0 0 0 0  Injury with Fall?  0 0 0 0 0  Risk for fall due to : No Fall Risks  No Fall Risks No Fall Risks No Fall Risks  Follow up Falls evaluation completed;Falls prevention discussed  Falls evaluation completed Falls evaluation completed Falls evaluation completed      Data saved with a previous flowsheet row definition    MEDICARE RISK AT HOME:  Medicare Risk at Home Any stairs in or around the home?: Yes If so, are there any without handrails?: Yes Home free of loose throw rugs in walkways, pet beds, electrical cords, etc?: Yes Adequate lighting in your home to reduce risk of falls?: Yes Life alert?: No Use of a cane, walker or w/c?: No Grab bars in the bathroom?: No Shower chair or bench in shower?: No Elevated toilet seat or a handicapped toilet?: No  TIMED UP AND GO:  Was the test performed?  No  Cognitive Function: Declined/Normal: No cognitive concerns noted by patient or family. Patient alert, oriented, able to answer questions appropriately and recall recent events. No signs of memory loss or confusion.    01/27/2024    1:18 PM  MMSE - Mini Mental State Exam  Not completed: Refused        Immunizations Immunization History  Administered Date(s) Administered   PFIZER(Purple Top)SARS-COV-2 Vaccination 06/26/2019, 07/24/2019, 04/03/2020   Tdap 12/05/2019    Screening Tests Health Maintenance  Topic Date Due   Pneumococcal Vaccine: 50+ Years (1 of 1 - PCV) Never done   Zoster Vaccines- Shingrix (1 of 2) Never done   DEXA SCAN  Never done   Influenza Vaccine  Never done   COVID-19 Vaccine (4 - 2025-26 season) 12/21/2023   Medicare Annual Wellness (AWV)  01/26/2025  Mammogram  08/27/2025   Fecal DNA (Cologuard)  01/10/2026   DTaP/Tdap/Td (2 - Td or Tdap) 12/04/2029   Hepatitis C Screening  Completed   Meningococcal B Vaccine  Aged Out    Health Maintenance Items Addressed:01/27/2024  Additional Screening:  Vision Screening: Recommended annual ophthalmology exams for early detection  of glaucoma and other disorders of the eye. Is the patient up to date with their annual eye exam?  Yes  Who is the provider or what is the name of the office in which the patient attends annual eye exams? [facility] [place]  Dental Screening: Recommended annual dental exams for proper oral hygiene  Community Resource Referral / Chronic Care Management: CRR required this visit?  No   CCM required this visit?  No   Plan:    I have personally reviewed and noted the following in the patient's chart:   Medical and social history Use of alcohol, tobacco or illicit drugs  Current medications and supplements including opioid prescriptions. Patient is not currently taking opioid prescriptions. Functional ability and status Nutritional status Physical activity Advanced directives List of other physicians Hospitalizations, surgeries, and ER visits in previous 12 months Vitals Screenings to include cognitive, depression, and falls Referrals and appointments  In addition, I have reviewed and discussed with patient certain preventive protocols, quality metrics, and best practice recommendations. A written personalized care plan for preventive services as well as general preventive health recommendations were provided to patient.   Verdie CHRISTELLA Saba, CMA   01/27/2024   After Visit Summary: (MyChart) Due to this being a telephonic visit, the after visit summary with patients personalized plan was offered to patient via MyChart   Notes: Nothing significant to report at this time.

## 2024-01-27 NOTE — Patient Instructions (Addendum)
 Ms. Susan Davila,  Thank you for taking the time for your Medicare Wellness Visit. I appreciate your continued commitment to your health goals. Please review the care plan we discussed, and feel free to reach out if I can assist you further.  Medicare recommends these wellness visits once per year to help you and your care team stay ahead of potential health issues. These visits are designed to focus on prevention, allowing your provider to concentrate on managing your acute and chronic conditions during your regular appointments.  Please note that Annual Wellness Visits do not include a physical exam. Some assessments may be limited, especially if the visit was conducted virtually. If needed, we may recommend a separate in-person follow-up with your provider.  Ongoing Care Seeing your primary care provider every 3 to 6 months helps us  monitor your health and provide consistent, personalized care.   Referrals If a referral was made during today's visit and you haven't received any updates within two weeks, please contact the referred provider directly to check on the status.  Recommended Screenings:  Health Maintenance  Topic Date Due   Pneumococcal Vaccine for age over 12 (1 of 1 - PCV) Never done   Zoster (Shingles) Vaccine (1 of 2) Never done   DEXA scan (bone density measurement)  Never done   Flu Shot  Never done   COVID-19 Vaccine (4 - 2025-26 season) 12/21/2023   Medicare Annual Wellness Visit  01/26/2025   Breast Cancer Screening  08/27/2025   Cologuard (Stool DNA test)  01/10/2026   DTaP/Tdap/Td vaccine (2 - Td or Tdap) 12/04/2029   Hepatitis C Screening  Completed   Meningitis B Vaccine  Aged Out       01/27/2024    1:13 PM  Advanced Directives  Does Patient Have a Medical Advance Directive? Yes  Type of Estate agent of [place];Living will  Does patient want to make changes to medical advance directive? No - Patient declined  Copy of Healthcare Power of  Attorney in Chart? Yes - validated most recent copy scanned in chart (See row information)   Advance Care Planning is important because it: Ensures you receive medical care that aligns with your values, goals, and preferences. Provides guidance to your family and loved ones, reducing the emotional burden of decision-making during critical moments.  Vision: Annual vision screenings are recommended for early detection of glaucoma, cataracts, and diabetic retinopathy. These exams can also reveal signs of chronic conditions such as diabetes and high blood pressure.  Dental: Annual dental screenings help detect early signs of oral cancer, gum disease, and other conditions linked to overall health, including heart disease and diabetes.

## 2024-02-09 ENCOUNTER — Encounter: Payer: Self-pay | Admitting: Internal Medicine

## 2024-02-17 ENCOUNTER — Telehealth: Payer: Self-pay

## 2024-02-17 NOTE — Telephone Encounter (Signed)
 Copied from CRM #[id number]. Topic: Clinical - Request for Lab/Test Order >> Feb 16, 2024  3:49 PM Mercedes MATSU wrote: Reason for CRM: Patient called in requesting that Corean Ku order her labs in order for her to be scheduled for her lab appointment. Patient is requesting a call back and can be reached at [phone], she is hoping to get labs 11/04 or anytime before then if possible.

## 2024-02-18 NOTE — Telephone Encounter (Signed)
 Patient has been scheduled to see Dr. Joshua. Patient appointments with Corean Ku, NP has ben cancelled because she didn't approve the [facility] and patient has been  made aware.

## 2024-02-18 NOTE — Telephone Encounter (Signed)
 Unable to reach patient. LMTRC

## 2024-02-18 NOTE — Telephone Encounter (Signed)
 Patient called in stated that she has to have a blood draw in order to get her levothyroxine  prescription renewed.Patient need lab orders placed by Dr Joshua for her to get schedule for a lab appointment. Patient isn't scheduled for [facility] with new provider until December.

## 2024-02-18 NOTE — Telephone Encounter (Unsigned)
 Copied from CRM [id number]. Topic: General - Other >> Feb 18, 2024  2:01 PM Antony RAMAN wrote: Reason for CRM: calling jaz [place]  cma back >> Feb 18, 2024  2:02 PM Antony RAMAN wrote: Off work at [employer]

## 2024-02-22 ENCOUNTER — Encounter: Payer: Self-pay | Admitting: Radiology

## 2024-02-24 ENCOUNTER — Ambulatory Visit: Admitting: Internal Medicine

## 2024-02-24 ENCOUNTER — Telehealth: Payer: Self-pay

## 2024-02-24 ENCOUNTER — Emergency Department (HOSPITAL_COMMUNITY)

## 2024-02-24 ENCOUNTER — Other Ambulatory Visit: Payer: Self-pay

## 2024-02-24 ENCOUNTER — Emergency Department (HOSPITAL_COMMUNITY)
Admission: EM | Admit: 2024-02-24 | Discharge: 2024-02-25 | Disposition: A | Discharge status: Home / Self Care | Attending: Emergency Medicine | Admitting: Emergency Medicine

## 2024-02-24 ENCOUNTER — Ambulatory Visit: Payer: Self-pay | Admitting: Internal Medicine

## 2024-02-24 ENCOUNTER — Encounter (HOSPITAL_COMMUNITY): Payer: Self-pay

## 2024-02-24 ENCOUNTER — Encounter: Payer: Self-pay | Admitting: Internal Medicine

## 2024-02-24 VITALS — BP 138/70 | HR 60 | Temp 98.1°F | Ht 68.0 in | Wt 138.8 lb

## 2024-02-24 DIAGNOSIS — E039 Hypothyroidism, unspecified: Secondary | ICD-10-CM

## 2024-02-24 DIAGNOSIS — I700 Atherosclerosis of aorta: Secondary | ICD-10-CM | POA: Diagnosis not present

## 2024-02-24 DIAGNOSIS — Z79899 Other long term (current) drug therapy: Secondary | ICD-10-CM | POA: Diagnosis not present

## 2024-02-24 DIAGNOSIS — I10 Essential (primary) hypertension: Secondary | ICD-10-CM | POA: Diagnosis not present

## 2024-02-24 DIAGNOSIS — R079 Chest pain, unspecified: Secondary | ICD-10-CM | POA: Diagnosis not present

## 2024-02-24 DIAGNOSIS — R9431 Abnormal electrocardiogram [ECG] [EKG]: Secondary | ICD-10-CM | POA: Insufficient documentation

## 2024-02-24 DIAGNOSIS — R0789 Other chest pain: Secondary | ICD-10-CM | POA: Diagnosis not present

## 2024-02-24 DIAGNOSIS — Z96612 Presence of left artificial shoulder joint: Secondary | ICD-10-CM | POA: Diagnosis not present

## 2024-02-24 DIAGNOSIS — R778 Other specified abnormalities of plasma proteins: Secondary | ICD-10-CM | POA: Diagnosis not present

## 2024-02-24 DIAGNOSIS — Z87891 Personal history of nicotine dependence: Secondary | ICD-10-CM | POA: Insufficient documentation

## 2024-02-24 LAB — BASIC METABOLIC PANEL WITH GFR
Anion gap: 13 (ref 5–15)
BUN: 17 mg/dL (ref 6–23)
BUN: 16 mg/dL (ref 8–23)
CO2: 32 meq/L (ref 19–32)
CO2: 27 mmol/L (ref 22–32)
Calcium: 9.5 mg/dL (ref 8.4–10.5)
Calcium: 9.2 mg/dL (ref 8.9–10.3)
Chloride: 101 meq/L (ref 96–112)
Chloride: 100 mmol/L (ref 98–111)
Creatinine, Ser: 0.75 mg/dL (ref 0.40–1.20)
Creatinine, Ser: 0.76 mg/dL (ref 0.44–1.00)
GFR, Estimated: >60 mL/min (ref >60)
GFR: 79.8 mL/min (ref >60.00)
Glucose, Bld: 94 mg/dL (ref 70–99)
Glucose, Bld: 86 mg/dL (ref 70–99)
Potassium: 4.2 meq/L (ref 3.5–5.1)
Potassium: 4 mmol/L (ref 3.5–5.1)
Sodium: 141 meq/L (ref 135–145)
Sodium: 140 mmol/L (ref 135–145)

## 2024-02-24 LAB — CBC WITH DIFFERENTIAL/PLATELET
Basophils Absolute: 0 K/uL (ref 0.0–0.1)
Basophils Relative: 1.6 % (ref 0.0–3.0)
Eosinophils Absolute: 0.1 K/uL (ref 0.0–0.7)
Eosinophils Relative: 2.4 % (ref 0.0–5.0)
HCT: 41.3 % (ref 36.0–46.0)
Hemoglobin: 14 g/dL (ref 12.0–15.0)
Lymphocytes Relative: 41.7 % (ref 12.0–46.0)
Lymphs Abs: 1.3 K/uL (ref 0.7–4.0)
MCHC: 33.9 g/dL (ref 30.0–36.0)
MCV: 94.2 fl (ref 78.0–100.0)
Monocytes Absolute: 0.3 K/uL (ref 0.1–1.0)
Monocytes Relative: 10.3 % (ref 3.0–12.0)
Neutro Abs: 1.3 K/uL — ABNORMAL LOW (ref 1.4–7.7)
Neutrophils Relative %: 44 % (ref 43.0–77.0)
Platelets: 215 K/uL (ref 150.0–400.0)
RBC: 4.39 Mil/uL (ref 3.87–5.11)
RDW: 12.9 % (ref 11.5–15.5)
WBC: 3.1 K/uL — ABNORMAL LOW (ref 4.0–10.5)

## 2024-02-24 LAB — DIFFERENTIAL
Abs Immature Granulocytes: 0 K/uL (ref 0.00–0.07)
Basophils Absolute: 0.1 K/uL (ref 0.0–0.1)
Basophils Relative: 1 %
Eosinophils Absolute: 0.1 K/uL (ref 0.0–0.5)
Eosinophils Relative: 2 %
Immature Granulocytes: 0 %
Lymphocytes Relative: 36 %
Lymphs Abs: 1.5 K/uL (ref 0.7–4.0)
Monocytes Absolute: 0.5 K/uL (ref 0.1–1.0)
Monocytes Relative: 11 %
Neutro Abs: 2.1 K/uL (ref 1.7–7.7)
Neutrophils Relative %: 50 %

## 2024-02-24 LAB — LIPID PANEL
Cholesterol: 227 mg/dL — ABNORMAL HIGH (ref 0–200)
HDL: 107.9 mg/dL (ref >39.00)
LDL Cholesterol: 107 mg/dL — ABNORMAL HIGH (ref 0–99)
NonHDL: 119.5
Total CHOL/HDL Ratio: 2
Triglycerides: 63 mg/dL (ref 0.0–149.0)
VLDL: 12.6 mg/dL (ref 0.0–40.0)

## 2024-02-24 LAB — CBC
HCT: 41.3 % (ref 36.0–46.0)
Hemoglobin: 14.2 g/dL (ref 12.0–15.0)
MCH: 32.2 pg (ref 26.0–34.0)
MCHC: 34.4 g/dL (ref 30.0–36.0)
MCV: 93.7 fL (ref 80.0–100.0)
Platelets: 226 K/uL (ref 150–400)
RBC: 4.41 MIL/uL (ref 3.87–5.11)
RDW: 11.9 % (ref 11.5–15.5)
WBC: 4.2 K/uL (ref 4.0–10.5)
nRBC: 0 % (ref 0.0–0.2)

## 2024-02-24 LAB — TROPONIN I: Troponin I: 97 ng/L (ref <=47)

## 2024-02-24 LAB — BRAIN NATRIURETIC PEPTIDE: Brain Natriuretic Peptide: 100 pg/mL — ABNORMAL HIGH (ref <100)

## 2024-02-24 LAB — TROPONIN I (HIGH SENSITIVITY): Troponin I (High Sensitivity): 6 ng/L (ref <18)

## 2024-02-24 NOTE — Patient Instructions (Signed)

## 2024-02-24 NOTE — ED Provider Triage Note (Signed)
 Emergency Medicine Provider Triage Evaluation Note  Susan Davila , a 73 y.o. female  was evaluated in triage.  Pt complains of elevated troponins and abnormal ECG and was told to come to the ED by PCP who she saw this AM. She reports she feels fine and feels normal. No exertional Sx. Works out regularly without difficulty.   Hx of HLD, HTN, and hypothyroidism.   Denies fever, headache, vision changes, chest pain, shortness of breath, cough, congestion, abdominal pain, n/v/d, dysuria, LE swelling.   Review of Systems  Positive: N/a Negative: N/a  Physical Exam  BP (!) 154/76 (BP Location: Left Arm)   Pulse 64   Temp 97.9 F (36.6 C) (Oral)   Resp 16   Ht 5' 8 (1.727 m)   Wt 63 kg   SpO2 100%   BMI 21.10 kg/m  Gen:   Awake, no distress   Resp:  Normal effort  MSK:   Moves extremities without difficulty  Other:    Medical Decision Making  Medically screening exam initiated at 5:09 PM.  Appropriate orders placed.  Susan Davila was informed that the remainder of the evaluation will be completed by another provider, this initial triage assessment does not replace that evaluation, and the importance of remaining in the ED until their evaluation is complete.     Beola Terrall RAMAN, [place] 02/24/24 1712

## 2024-02-24 NOTE — Telephone Encounter (Signed)
 Patient called in as she was not understanding what MyChart message was telling her to do. She is not having any symptoms and was concerned why she needed to go to the emergency room.  Educated patient on the result and why it is important to go to the emergency room for care. She stated that she will head that way shortly as she closes out at work.

## 2024-02-24 NOTE — ED Triage Notes (Signed)
 Pt to er, pt states that she went to her pmd and they did blood work and an EKG, states that she had some t wave inversion, her trop was also elevated, pt denies chest pain, pt talking in full sentence.

## 2024-02-24 NOTE — Progress Notes (Unsigned)
 Subjective:  Patient ID: Susan Davila, female    DOB: Dec 09, 1950  Age: 73 y.o. MRN: [id number]  CC: Hypertension and Hypothyroidism   HPI Susan Davila presents for f/up ----  Discussed the use of AI scribe software for clinical note transcription with the patient, who gave verbal consent to proceed.  History of Present Illness Susan Davila is a 73 year old female with hypothyroidism who presents for a routine follow-up.  She feels generally well and maintains an active lifestyle, exercising five to six times a week with gym workouts and walking her dogs. She is consistently active and rarely sits down.  Her thyroid  medication dose has remained unchanged for a long time. No constipation, fatigue, weight gain, or weight loss, but she feels cold more easily than others and sometimes experiences constipation, which she manages by increasing her water  intake.  She received her flu and COVID vaccines approximately four weeks ago.     Outpatient Medications Prior to Visit  Medication Sig Dispense Refill   levothyroxine  (SYNTHROID ) 25 MCG tablet TAKE 1 TABLET(25 MCG) BY MOUTH DAILY BEFORE BREAKFAST 90 tablet 0   torsemide  (DEMADEX ) 5 MG tablet Take 1 tablet (5 mg total) by mouth daily. Schedule an appt for further refills 90 tablet 1   No facility-administered medications prior to visit.    ROS Review of Systems  Endocrine: Positive for cold intolerance.  Genitourinary: Negative.   Hematological:  Negative for adenopathy. Does not bruise/bleed easily.    Objective:  BP 138/70 (BP Location: Left Arm, Patient Position: Sitting, Cuff Size: Normal)   Pulse 60   Temp 98.1 F (36.7 C) (Oral)   Ht 5' 8 (1.727 m)   Wt 138 lb 12.8 oz (63 kg)   SpO2 95%   BMI 21.10 kg/m   BP Readings from Last 3 Encounters:  02/24/24 (!) 154/76  02/24/24 138/70  09/29/23 136/77    Wt Readings from Last 3 Encounters:  02/24/24 138 lb 12.8 oz (63 kg)  01/27/24 138 lb (62.6 kg)  08/27/23  138 lb (62.6 kg)    Physical Exam Cardiovascular:     Rate and Rhythm: Regular rhythm. Bradycardia present.     Pulses: Normal pulses.     Heart sounds: No murmur heard.    No friction rub. No gallop.     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     Left lower leg: No edema.     Lab Results  Component Value Date   WBC 2.9 (L) 08/13/2023   HGB 14.2 08/13/2023   HCT 42.1 08/13/2023   PLT 226.0 08/13/2023   GLUCOSE 90 08/13/2023   CHOL 226 (H) 08/13/2023   TRIG 65.0 08/13/2023   HDL 108.50 08/13/2023   LDLCALC 104 (H) 08/13/2023   ALT 20 08/13/2023   AST 23 08/13/2023   NA 140 08/13/2023   K 4.3 08/13/2023   CL 101 08/13/2023   CREATININE 0.82 08/13/2023   BUN 24 (H) 08/13/2023   CO2 33 (H) 08/13/2023   TSH 1.82 08/13/2023    DG Wrist Complete Right Result Date: 09/29/2023 CLINICAL DATA:  Status post fall fall 3 days ago EXAM: RIGHT WRIST - COMPLETE 3+ VIEW COMPARISON:  None Available. FINDINGS: There is no evidence of fracture or dislocation. There is no evidence of arthropathy or other focal bone abnormality. Soft tissues are unremarkable. IMPRESSION: Negative. Electronically Signed  By: Franky Chard M.D.   On: 09/29/2023 16:54    Assessment & Plan:  Abnormal electrocardiogram (ECG) (EKG) -     Troponin I (High Sensitivity); Future -     Brain natriuretic peptide; Future -     Troponin I -; Future  Acquired hypothyroidism -     TSH; Future -     Lipid panel; Future  Primary hypertension -     Basic metabolic panel with GFR; Future -     CBC with Differential/Platelet; Future -     EKG 12-Lead     Follow-up: Return in about 6 months (around 08/23/2024).  Debby Molt, MD

## 2024-02-24 NOTE — ED Triage Notes (Signed)
 Sent from cardiac  office abn EKG sent here for admission

## 2024-02-25 ENCOUNTER — Emergency Department (HOSPITAL_COMMUNITY)

## 2024-02-25 DIAGNOSIS — I700 Atherosclerosis of aorta: Secondary | ICD-10-CM | POA: Diagnosis not present

## 2024-02-25 DIAGNOSIS — R7989 Other specified abnormal findings of blood chemistry: Secondary | ICD-10-CM | POA: Diagnosis not present

## 2024-02-25 DIAGNOSIS — R9431 Abnormal electrocardiogram [ECG] [EKG]: Secondary | ICD-10-CM

## 2024-02-25 LAB — ECHOCARDIOGRAM COMPLETE
AR max vel: 1.9 cm2
AV Area VTI: 1.89 cm2
AV Area mean vel: 1.82 cm2
AV Mean grad: 4 mmHg
AV Peak grad: 7.4 mmHg
Ao pk vel: 1.36 m/s
Area-P 1/2: 2.88 cm2
Calc EF: 64.1 %
Height: 68 in
MV VTI: 3.15 cm2
S' Lateral: 2.7 cm
Single Plane A2C EF: 64.8 %
Single Plane A4C EF: 61 %
Weight: 2220.8 [oz_av]

## 2024-02-25 LAB — HEMOGLOBIN A1C
Hgb A1c MFr Bld: 4.9 % (ref 4.8–5.6)
Mean Plasma Glucose: 93.93 mg/dL

## 2024-02-25 LAB — TROPONIN I (HIGH SENSITIVITY): Troponin I (High Sensitivity): 6 ng/L (ref <18)

## 2024-02-25 LAB — TSH: TSH: 1.29 u[IU]/mL (ref 0.35–5.50)

## 2024-02-25 MED ORDER — NITROGLYCERIN 0.4 MG SL SUBL
SUBLINGUAL_TABLET | SUBLINGUAL | Status: AC
  Filled 2024-02-25: qty 2

## 2024-02-25 MED ORDER — IOHEXOL 350 MG/ML SOLN
100.0000 mL | Freq: Once | INTRAVENOUS | Status: AC | PRN
  Administered 2024-02-25: 100 mL via INTRAVENOUS

## 2024-02-25 MED ORDER — TORSEMIDE 5 MG PO TABS: 5.0000 mg | ORAL_TABLET | Freq: Every day | ORAL | 1 refills | Status: AC

## 2024-02-25 MED ORDER — NITROGLYCERIN 0.4 MG SL SUBL
0.8000 mg | SUBLINGUAL_TABLET | Freq: Once | SUBLINGUAL | Status: AC
  Administered 2024-02-25: 0.8 mg via SUBLINGUAL

## 2024-02-25 MED ORDER — LEVOTHYROXINE SODIUM 25 MCG PO TABS: 25.0000 ug | ORAL_TABLET | Freq: Every day | ORAL | 1 refills | Status: AC

## 2024-02-25 NOTE — ED Provider Notes (Signed)
 [place] EMERGENCY DEPARTMENT AT [facility] Provider Note   CSN: [id number] Arrival date & time: 02/24/24  1539     Patient presents with: Chest Pain   Susan Davila is a 73 y.o. female.   The history is provided by the patient.  Chest Pain Susan Davila is a 73 y.o. female who presents to the Emergency Department complaining of abnormal EKG and lab.  She presents to the emergency department upon referral from her PCP for abnormal EKG and lab on outpatient testing.  She went to her PCPs office today for routine refill on her levothyroxine  and they checked an EKG, which had changed from the year prior.  They did a troponin and BNP and these were elevated at 97 and 100 respectively and she was referred to the emergency department for further evaluation.  She denies any fevers, chills, chest pain, shortness of breath, nausea, vomiting, abdominal pain, leg swelling or pain.  She describes herself as a very healthy and active person.  No history of hypertension, diabetes, DVT or PE.  No hormone use.     Prior to Admission medications   Medication Sig Start Date End Date Taking? Authorizing Provider  levothyroxine  (SYNTHROID ) 25 MCG tablet TAKE 1 TABLET(25 MCG) BY MOUTH DAILY BEFORE BREAKFAST 01/11/24   Joshua Debby CROME, MD  torsemide  (DEMADEX ) 5 MG tablet Take 1 tablet (5 mg total) by mouth daily. Schedule an appt for further refills 08/13/23   Joshua Debby CROME, MD    Allergies: Patient has no known allergies.    Review of Systems  Cardiovascular:  Positive for chest pain.  All other systems reviewed and are negative.   Updated Vital Signs BP (!) 140/83   Pulse (!) 52   Temp 97.7 F (36.5 C) (Oral)   Resp 16   Ht 5' 8 (1.727 m)   Wt 63 kg   SpO2 100%   BMI 21.10 kg/m   Physical Exam Vitals and nursing note reviewed.  Constitutional:      Appearance: She is well-developed.  HENT:     Head: Normocephalic and atraumatic.  Cardiovascular:     Rate and Rhythm:  Normal rate and regular rhythm.     Heart sounds: No murmur heard. Pulmonary:     Effort: Pulmonary effort is normal. No respiratory distress.     Breath sounds: Normal breath sounds.  Abdominal:     Palpations: Abdomen is soft.     Tenderness: There is no abdominal tenderness. There is no guarding or rebound.  Musculoskeletal:        General: No swelling or tenderness.  Skin:    General: Skin is warm and dry.  Neurological:     Mental Status: She is alert and oriented to person, place, and time.  Psychiatric:        Behavior: Behavior normal.     (all labs ordered are listed, but only abnormal results are displayed) Labs Reviewed  BASIC METABOLIC PANEL WITH GFR  CBC  DIFFERENTIAL  TROPONIN I (HIGH SENSITIVITY)  TROPONIN I (HIGH SENSITIVITY)    EKG: EKG Interpretation Date/Time:  Wednesday February 24 2024 16:53:15 EST Ventricular Rate:  60 PR Interval:  142 QRS Duration:  96 QT Interval:  404 QTC Calculation: 404 R Axis:   -33  Text Interpretation: Normal sinus rhythm with sinus arrhythmia Left axis deviation ST & T wave abnormality, consider inferior ischemia ST & T wave abnormality, consider anterolateral ischemia Abnormal ECG Confirmed by Griselda Norris [phone])  on 02/25/2024 3:59:02 AM  Radiology: DG Chest 2 View Result Date: 02/24/2024 CLINICAL DATA:  Chest pain, abnormal EKG EXAM: CHEST - 2 VIEW COMPARISON:  None Available. FINDINGS: Frontal and lateral views of the chest demonstrate an unremarkable cardiac silhouette. No acute airspace disease, effusion, or pneumothorax. Left shoulder arthroplasty. No acute bony abnormalities. IMPRESSION: 1. No acute intrathoracic process. Electronically Signed   By: Ozell Daring M.D.   On: 02/24/2024 18:20     Procedures   Medications Ordered in the ED - No data to display                                  Medical Decision Making Amount and/or Complexity of Data Reviewed Labs: ordered. Radiology: ordered.   Patient  referred to the emergency department for abnormal EKG and troponin on outpatient studies.  She has no systemic symptoms.  She did have an elevated troponin of 97.  EKG does have changes when compared to prior in the system.  Discussed with Dr. Melia with cardiology.  Plan to have the patient get an echo to further evaluate her heart.  Patient care transferred pending echo and cardiology recommendations.  Current picture is not consistent with PE.     Final diagnoses:  Abnormal EKG    ED Discharge Orders     None          Griselda Norris, MD 02/25/24 [phone]

## 2024-02-25 NOTE — ED Notes (Signed)
 Pt in bed, pt back from ct, pt denies pain, pt has no complaints, pt has no requests at this time.

## 2024-02-25 NOTE — Progress Notes (Addendum)
 Progress Note  Patient Name: Susan Davila Date of Encounter: 02/25/2024 [facility] Cardiologist: None   Interval Summary   Patient reports that she is asymptomatic.  Confirm that she had went to her PCP for her routine follow-up. Patient denied any history of chest pain or shortness of breath.  Shared that she is very active, she typically parks in the back of the parking lot to walk extra. Did report over the last 6 months she has had about 3-4 episodes of palpitations with general feeling off, episodes last about 15 minutes then go away spontaneously or with relaxation/coughing.  No chest pain or shortness of breath with these episodes.  Is unable to answer if she is lightheaded or dizzy just described it as feeling off.  Vital Signs Vitals:   02/25/24 0249 02/25/24 0600 02/25/24 0615 02/25/24 0634  BP: (!) 146/82 (!) 148/84 (!) 140/83   Pulse: (!) 55 (!) 50 (!) 52   Resp: 16 14 16    Temp: 98 F (36.7 C)   97.7 F (36.5 C)  TempSrc:    Oral  SpO2: 96% 100% 100%   Weight:      Height:       No intake or output data in the 24 hours ending 02/25/24 0721    02/24/2024    4:44 PM 02/24/2024   10:31 AM 01/27/2024    1:13 PM  Last 3 Weights  Weight (lbs) 138 lb 12.8 oz 138 lb 12.8 oz 138 lb  Weight (kg) 62.959 kg 62.959 kg 62.596 kg      Telemetry/ECG  Sinus bradycardia HR 55-60- Personally Reviewed  Physical Exam  GEN: No acute distress.   Cardiac: RRR, no murmurs, rubs, or gallops.  Respiratory: Clear to auscultation bilaterally. GI: Soft, nontender, non-distended  MS: No edema  Assessment & Plan  Susan Davila is a 73 y.o. female with a hx of HTN and hypothyroidism who presented to the ED 11/6 at the request of her PCP for new lateral TWI on ECG found at routine visit. Patient remained asymptomatic.   Abnormal ECG [new lateral TWI and LAFB ] Incidentally found at PCP visit.  Troponin elevation outpatient, negative inpatient Patient denied anginal symptoms   Obtaining echocardiogram today for further risk stratification, but at this time no indication to pursue further ischemic evaluation inpatient.  Could consider CCTA outpatient if no RWMA on echo today.  Risk Stratification:  Lipid Panel: LDL 107   HDL 107 A1c 4.9%  Hypertension BP: 140/83 PTA torsemide  20 mg  BP has been elevated thus far this admission, though patient has received no medications in ED. Advised patient to take her blood pressure log and bring to follow-up appointment for further adjustment if needed.  Palpitations Patient describes several episodes of 15 minutes of palpitations.   Telemetry showed sinus rhythm Will continue to monitor on telemetry, we will consider heart monitor outpatient.  She may benefit from a 30-day monitor given the infrequency of these palpitations.  Per primary  Hypothyroidism    For questions or updates, please contact [facility] Please consult [email or web address] for contact info under   Signed, Leontine LOISE Salen, PA-C   Patient seen and examined, note reviewed with the signed Advanced Practice Provider. I personally reviewed laboratory data, imaging studies and relevant notes. I independently examined the patient and formulated the important aspects of the plan. I have personally discussed the plan with the patient and/or family. Comments or changes to the note/plan are indicated below.  Patient seen examined at her bedside.  Her husband was in her room when I arrived.  Abnormal EKG Hypertension Palpitations Hypothyroidism  Clinically no chest pain at the time of my visit.  EKG abnormal with new changes of T wave inversion in the lateral leads.  It will be beneficial to get a coronary CTA in this patient to rule out any ischemic etiology of this. She is agreeable for this. Will move forward with this.  Echocardiogram is pending.  I spoke with the patient and her husband present it seems that her blood pressure even in  outpatient settings have been in the 130s.  Currently she is not on any antihypertensive medication.  I have shared with them that she has a diagnosis of hypertension.  For now she wants to wait and monitor the blood pressure in the outpatient setting and then we could adjust as appropriate.  This is not unreasonable  Dub Huntsman DO, MS [facility] Attending Cardiologist [facility]  [address] [place], [place] [address] [phone] Website: [email or web address]

## 2024-02-25 NOTE — Consult Note (Signed)
 Cardiology Consultation   Patient ID: Susan Davila MRN: [id number]; DOB: [birth year]  Admit date: 02/24/2024 Date of Consult: 02/25/2024  PCP:  Susan Debby CROME, MD   [facility] Providers Cardiologist:  None        Patient Profile: Susan Davila is a 73 y.o. female with a hx of HTN, hypothyroidism who is being seen 02/25/2024 for the evaluation of new TWI at the request of Dr. Griselda.  History of Present Illness: Susan Davila feels in her USOH. She went to her PCP for a refill on her levothyroxine . They hadn't done an EKG for a year so decided to check one. That EKG came back abnormal with lateral TWI in V4-V6, I and aVL. A troponin I and BNP were sent off and those resulted troponin 97 and BNP 100 so she was told to come to the ED for evaluation.  On arrival to the ED, she continues to feel well. Hs-troponins in the ED 6 -> 6. EKG here showing continued TWI in the lateral leads. She denies ever having chest pain or SOB. Works out at [employer] 4x/week doing [employer] and occasional cardio without angina. She walks with her dog around the neighborhood daily without anginal symptoms either. Does occasionally feel flutters in chest but those episodes are brief and usually resolve after she relaxes and has a sip of water .  Is on torsemide  at home. States that at her previous job, she was standing on her feet all day, by the end of the day, we would notice swelling predominantly in her left leg. So she told her PCP and they started on torsemide  to help with the swelling. No orthopnea, PND.   Past Medical History:  Diagnosis Date   Primary hypertension 07/28/2022   Thyroid  disease     Past Surgical History:  Procedure Laterality Date   BARTHOLIN CYST MARSUPIALIZATION     EYE SURGERY     LIPOSUCTION     TOTAL SHOULDER ARTHROPLASTY Left 03/05/2021   Procedure: LEFT REVERSE TOTAL SHOULDER ARTHROPLASTY;  Surgeon: Susan Cordella Hamilton, MD;  Location: [facility] OR;  Service: Orthopedics;   Laterality: Left;   wisdom teeth Bilateral      Home Medications:  Prior to Admission medications   Medication Sig Start Date End Date Taking? Authorizing Provider  levothyroxine  (SYNTHROID ) 25 MCG tablet TAKE 1 TABLET(25 MCG) BY MOUTH DAILY BEFORE BREAKFAST 01/11/24   Susan Debby CROME, MD  torsemide  (DEMADEX ) 5 MG tablet Take 1 tablet (5 mg total) by mouth daily. Schedule an appt for further refills 08/13/23   Susan Debby CROME, MD    Scheduled Meds:  Continuous Infusions:  PRN Meds:   Allergies:   No Known Allergies  Social History:   Social History   Socioeconomic History   Marital status: Married    Spouse name: Susan Davila   Number of children: 1   Years of education: Not on file   Highest education level: Bachelor's degree (e.g., BA, AB, BS)  Occupational History   Not on file  Tobacco Use   Smoking status: Former    Current packs/day: 0.00    Types: Cigarettes    Quit date: 09/15/1978    Years since quitting: 45.4    Passive exposure: Never   Smokeless tobacco: Never  Vaping Use   Vaping status: Never Used  Substance and Sexual Activity   Alcohol use: Yes    Alcohol/week: 20.0 standard drinks of alcohol    Types: 20 Glasses of wine per  week   Drug use: Never   Sexual activity: Yes    Partners: Male    Birth control/protection: None  Other Topics Concern   Not on file  Social History Narrative   ** Merged History Encounter **         Right Handed   Lives in a one story home   Drinks Caffeine        Social Drivers of Health   Financial Resource Strain: Patient Declined (02/23/2024)   Overall Financial Resource Strain (CARDIA)    Difficulty of Paying Living Expenses: Patient declined  Food Insecurity: Patient Declined (02/23/2024)   Hunger Vital Sign    Worried About Running Out of Food in the Last Year: Patient declined    Ran Out of Food in the Last Year: Patient declined  Transportation Needs: Patient Declined (02/23/2024)   PRAPARE -  Administrator, Civil Service (Medical): Patient declined    Lack of Transportation (Non-Medical): Patient declined  Physical Activity: Sufficiently Active (02/23/2024)   Exercise Vital Sign    Days of Exercise per Week: 5 days    Minutes of Exercise per Session: 60 min  Stress: Patient Declined (02/23/2024)   [employer] of Occupational Health - Occupational Stress Questionnaire    Feeling of Stress: Patient declined  Social Connections: Unknown (02/23/2024)   Social Connection and Isolation Panel    Frequency of Communication with Friends and Family: Patient declined    Frequency of Social Gatherings with Friends and Family: Patient declined    Attends Religious Services: Patient declined    Database Administrator or Organizations: Patient declined    Attends Engineer, Structural: Not on file    Marital Status: Married  Recent Concern: Social Connections - Moderately Isolated (01/27/2024)   Social Connection and Isolation Panel    Frequency of Communication with Friends and Family: Twice a week    Frequency of Social Gatherings with Friends and Family: Once a week    Attends Religious Services: Never    Database Administrator or Organizations: No    Attends Banker Meetings: Never    Marital Status: Married  Catering Manager Violence: Not At Risk (01/27/2024)   Humiliation, Afraid, Rape, and Kick questionnaire    Fear of Current or Ex-Partner: No    Emotionally Abused: No    Physically Abused: No    Sexually Abused: No    Family History:   Family History  Problem Relation Age of Onset   Dementia Mother 45   Alcohol abuse Father 55     ROS:  Please see the history of present illness.  All other ROS reviewed and negative.     Physical Exam/Data: Vitals:   02/24/24 1548 02/24/24 1644 02/24/24 2217 02/25/24 0249  BP: (!) 154/76  (!) 156/87 (!) 146/82  Pulse: 64  (!) 57 (!) 55  Resp: 16  17 16   Temp: 97.9 F (36.6 C)  97.7 F (36.5 C)  98 F (36.7 C)  TempSrc: Oral     SpO2: 100%  97% 96%  Weight:  63 kg    Height:  5' 8 (1.727 m)     No intake or output data in the 24 hours ending 02/25/24 0500    02/24/2024    4:44 PM 02/24/2024   10:31 AM 01/27/2024    1:13 PM  Last 3 Weights  Weight (lbs) 138 lb 12.8 oz 138 lb 12.8 oz 138 lb  Weight (kg) 62.959  kg 62.959 kg 62.596 kg     Body mass index is 21.1 kg/m.  General:  Well nourished, well developed, in no acute distress HEENT: normal Neck: no JVD Vascular: No carotid bruits; Distal pulses 2+ bilaterally Cardiac:  normal S1, S2; RRR; no murmur  Lungs:  clear to auscultation bilaterally, no wheezing, rhonchi or rales  Abd: soft, nontender, no hepatomegaly  Ext: no edema Musculoskeletal:  No deformities, BUE and BLE strength normal and equal Skin: warm and dry  Neuro:  CNs 2-12 intact, no focal abnormalities noted Psych:  Normal affect   EKG:  The EKG was personally reviewed and demonstrates:  SB, LAFB (new compared to EKG from 2024), lateral TWIs present (new compared to EKGs from 2024) Telemetry:  Telemetry was personally reviewed and demonstrates:  SR  Relevant CV Studies: N/a  Laboratory Data: High Sensitivity Troponin:   Recent Labs  Lab 02/24/24 1647 02/25/24 0013  TROPONINIHS 6 6     Chemistry Recent Labs  Lab 02/24/24 1647  NA 140  K 4.0  CL 100  CO2 27  GLUCOSE 86  BUN 16  CREATININE 0.76  CALCIUM 9.2  GFRNONAA >60  ANIONGAP 13    No results for input(s): PROT, ALBUMIN, AST, ALT, ALKPHOS, BILITOT in the last 168 hours. Lipids No results for input(s): CHOL, TRIG, HDL, LABVLDL, LDLCALC, CHOLHDL in the last 168 hours.  Hematology Recent Labs  Lab 02/24/24 1647  WBC 4.2  RBC 4.41  HGB 14.2  HCT 41.3  MCV 93.7  MCH 32.2  MCHC 34.4  RDW 11.9  PLT 226   Thyroid  No results for input(s): TSH, FREET4 in the last 168 hours.  BNP Recent Labs  Lab 02/24/24 1131  BNP 100*    DDimer No results for  input(s): DDIMER in the last 168 hours.  Radiology/Studies:  DG Chest 2 View Result Date: 02/24/2024 CLINICAL DATA:  Chest pain, abnormal EKG EXAM: CHEST - 2 VIEW COMPARISON:  None Available. FINDINGS: Frontal and lateral views of the chest demonstrate an unremarkable cardiac silhouette. No acute airspace disease, effusion, or pneumothorax. Left shoulder arthroplasty. No acute bony abnormalities. IMPRESSION: 1. No acute intrathoracic process. Electronically Signed   By: Ozell Daring M.D.   On: 02/24/2024 18:20    Assessment and Plan: 73 y.o. female with a hx of HTN, hypothyroidism who is being seen 02/25/2024 for the evaluation of new TWI at the request of Dr. Griselda.  #New lateral TWI #New LAFB Patient presents for abnormal EKG found incidentally (new lateral TWI and LAFB compared to 2024), referred in by her PCP. Initially her troponin I on outside lab resulted back at 97, but hs-troponins in the ED have been 6 -> 6. She feels her usual state of health without complaints of chest pain, abdominal pain, SOB. She has good exercise capacity and denies anginal symptoms with those activities as well. Hard to know what to make of the EKG changes as they continue to be persistent and I suspect have been there for a while now, just newly discovered. No lateral Q waves to suggest prior infarction and no LVH by EKG criteria to suggest changes c/w hypertrophy. We discussed obtaining a TTE as part of today's work up to ensure normal function and LV size/wall thickness. If TTE comes back normal, I suspect she should be able to discharge home from the ED. [ ]  f/u TTE   Risk Assessment/Risk Scores:      For questions or updates, please contact [facility]  Please consult [email or web address] for contact info under    Signed, Curtistine LITTIE Farr, MD  02/25/2024 5:00 AM

## 2024-02-25 NOTE — ED Provider Notes (Signed)
 Care for to me.  Lab work is unremarkable.  Echo does not show any wall motion abnormalities.  She has been asymptomatic the entire time she has been in the emergency department.  Cardiology had also ordered a coronary CT and this is unremarkable as well.  Will discharge and have her follow-up with outpatient cardiology.   Susan Hamilton, MD 02/25/24 [phone]

## 2024-02-25 NOTE — ED Notes (Signed)
 Pt in bed, pt denies pain, resps even and unlabored, pt states that she is ready to go home, read and reviewed d/c instructions and follow up, advised to return for any concerns or worsening symptoms, pt from department with husband.

## 2024-02-25 NOTE — Discharge Instructions (Addendum)
 Your workup today does not show any evidence of a heart attack.  Follow-up with your primary care provider and the cardiology group.  However, if you develop chest pain, shortness of breath, or any other new/concerning symptoms then return to the ER.

## 2024-03-04 ENCOUNTER — Telehealth: Payer: Self-pay | Admitting: Cardiology

## 2024-03-04 NOTE — Telephone Encounter (Signed)
 Pt c/o BP issue: STAT if pt c/o blurred vision, one-sided weakness or slurred speech.  STAT if BP is GREATER than 180/120 TODAY.  STAT if BP is LESS than 90/60 and SYMPTOMATIC TODAY  1. What is your BP concern?  See below  2. Have you taken any BP medication today?  No medication prescribed  3. What are your last 5 BP readings?  154/83 HR 62- Last night at 8:00 pm 11/8 - 139/78  HR 58 11/9 - 141/77  HR 59 11/11 - 154/74  HR 54 11/12 - 131/71  HR 86 morning             146/83  HR 66 night  4. Are you having any other symptoms (ex. Dizziness, headache, blurred vision, passed out)?   A dull headache  Patient stated her BP has been trending high and is concerned her BP has not gone down.

## 2024-03-04 NOTE — Telephone Encounter (Signed)
 Called patient about message. Informed her that she has not been seen by our cardiologist, so we cannot advise on someone we have not seen. Encouraged patient to call her PCP about her BPs.

## 2024-03-07 ENCOUNTER — Ambulatory Visit: Payer: Self-pay

## 2024-03-07 NOTE — Telephone Encounter (Signed)
 Pt denies s/s at this time, states she was calling to let her PCP know that she is having consistent systolic numbers over 130. Pt states she was told to let her PCP know that her systolic is creeping up. No available appts with PCP, routing to clinic for scheduling.   Copied from CRM [id number]. Topic: Clinical - Red Word Triage >> Mar 07, 2024 12:08 PM Victoria A wrote: [facility] that prompted transfer to Nurse Triage: Patient is having dizziness due to pressure 152/78

## 2024-03-11 NOTE — Telephone Encounter (Signed)
 Unable to reach patient. LMTRC

## 2024-03-14 ENCOUNTER — Ambulatory Visit (INDEPENDENT_AMBULATORY_CARE_PROVIDER_SITE_OTHER): Admitting: Family Medicine

## 2024-03-14 ENCOUNTER — Encounter: Payer: Self-pay | Admitting: Family Medicine

## 2024-03-14 VITALS — BP 138/71 | HR 51 | Temp 97.8°F | Resp 18 | Ht 68.0 in | Wt 139.0 lb

## 2024-03-14 DIAGNOSIS — I10 Essential (primary) hypertension: Secondary | ICD-10-CM | POA: Diagnosis not present

## 2024-03-14 MED ORDER — LOSARTAN POTASSIUM 25 MG PO TABS: 25.0000 mg | ORAL_TABLET | Freq: Every day | ORAL | 2 refills | Status: AC

## 2024-03-14 NOTE — Assessment & Plan Note (Signed)
 Blood pressure elevated in ER, likely stress-related.  Goal: systolic <140 mmHg, diastolic <09 mmHg.  No diabetes or significant comorbidities.  Normal kidney function and electrolytes.  - Initiated losartan  25 mg daily. - Instructed to log blood pressure daily post-medication. - Advised to limit salt intake. - Education provided on hypertension Orders:   losartan  (COZAAR ) 25 MG tablet; Take 1 tablet (25 mg total) by mouth daily.

## 2024-03-14 NOTE — Progress Notes (Signed)
 Assessment & Plan Primary hypertension Blood pressure elevated in ER, likely stress-related.  Goal: systolic <140 mmHg, diastolic <09 mmHg.  No diabetes or significant comorbidities.  Normal kidney function and electrolytes.  - Initiated losartan  25 mg daily. - Instructed to log blood pressure daily post-medication. - Advised to limit salt intake. - Education provided on hypertension Orders:   losartan  (COZAAR ) 25 MG tablet; Take 1 tablet (25 mg total) by mouth daily.    Follow up plan: Return in about 4 weeks (around 04/11/2024) for BP with new female PCP.  Niki Rung, MSN, APRN, FNP-C  Subjective:  HPI: Susan Davila is a 73 y.o. female presenting on 03/14/2024 for Hypertension (Elevated BP for several weeks had PCP OV and seen ER on 11/5 for abnormal EKG. At ER, cardio advised to see her for elevated BP and 1st available appt is FEB. Would like to start on something today )  Discussed the use of AI scribe software for clinical note transcription with the patient, who gave verbal consent to proceed.  Her blood pressure was elevated in the emergency room, with readings of 156/87 mmHg, which was higher than her usual blood pressure. She attributes this elevation to being 'scared to death' during the ER visit. Historically, her blood pressure has been low, around 115 mmHg systolic.  She is currently taking torsemide  5 mg once daily. She has not started any new medications for blood pressure. She has been logging her blood pressure readings since her hospital discharge, noting that 10 out of 13 readings were over 130 mmHg, with most readings in the 130s.  No known allergies.  She tries to limit salt intake due to her husband's history of heart surgery.       ROS: Negative unless specifically indicated above in HPI.   Relevant past medical history reviewed and updated as indicated.   Allergies and medications reviewed and updated.   Current Outpatient Medications:     levothyroxine  (SYNTHROID ) 25 MCG tablet, Take 1 tablet (25 mcg total) by mouth daily before breakfast. TAKE 1 TABLET(25 MCG) BY MOUTH DAILY BEFORE BREAKFAST, Disp: 90 tablet, Rfl: 1   losartan  (COZAAR ) 25 MG tablet, Take 1 tablet (25 mg total) by mouth daily., Disp: 30 tablet, Rfl: 2   torsemide  (DEMADEX ) 5 MG tablet, Take 1 tablet (5 mg total) by mouth daily., Disp: 90 tablet, Rfl: 1  No Known Allergies  Objective:   BP 138/71 Comment: home reading yesterday  Pulse (!) 51   Temp 97.8 F (36.6 C)   Resp 18   Ht 5' 8 (1.727 m)   Wt 139 lb (63 kg)   SpO2 97%   BMI 21.13 kg/m    Physical Exam Vitals reviewed.  Constitutional:      General: She is not in acute distress.    Appearance: Normal appearance. She is not ill-appearing, toxic-appearing or diaphoretic.  HENT:     Head: Normocephalic and atraumatic.  Eyes:     General: No scleral icterus.       Right eye: No discharge.        Left eye: No discharge.     Conjunctiva/sclera: Conjunctivae normal.  Cardiovascular:     Rate and Rhythm: Normal rate and regular rhythm.     Heart sounds: Normal heart sounds. No murmur heard.    No friction rub. No gallop.  Pulmonary:     Effort: Pulmonary effort is normal. No respiratory distress.     Breath sounds: Normal breath sounds. No  stridor. No wheezing, rhonchi or rales.  Musculoskeletal:        General: Normal range of motion.     Cervical back: Normal range of motion.  Skin:    General: Skin is warm and dry.     Capillary Refill: Capillary refill takes less than 2 seconds.  Neurological:     General: No focal deficit present.     Mental Status: She is alert and oriented to person, place, and time. Mental status is at baseline.  Psychiatric:        Mood and Affect: Mood normal.        Behavior: Behavior normal.        Thought Content: Thought content normal.        Judgment: Judgment normal.

## 2024-03-14 NOTE — Telephone Encounter (Signed)
 Patient was seen in office today.

## 2024-04-11 ENCOUNTER — Ambulatory Visit: Admitting: Family Medicine

## 2024-04-11 ENCOUNTER — Encounter: Payer: Self-pay | Admitting: Family Medicine

## 2024-04-11 VITALS — BP 110/70 | HR 66 | Temp 98.8°F | Ht 68.0 in | Wt 138.0 lb

## 2024-04-11 DIAGNOSIS — J029 Acute pharyngitis, unspecified: Secondary | ICD-10-CM

## 2024-04-11 DIAGNOSIS — E039 Hypothyroidism, unspecified: Secondary | ICD-10-CM | POA: Diagnosis not present

## 2024-04-11 DIAGNOSIS — I10 Essential (primary) hypertension: Secondary | ICD-10-CM

## 2024-04-11 NOTE — Progress Notes (Signed)
 "  Acute Office Visit  Subjective:     Patient ID: Susan Davila, female    DOB: [birth year], 73 y.o.   MRN: [id number]  Chief Complaint  Patient presents with   Acute Visit    C/o sore throat and green mucus this morning states BP still creeping up BP ranging 115/61 - 149/81    HPI  Discussed the use of AI scribe software for clinical note transcription with the patient, who gave verbal consent to proceed.  History of Present Illness Susan Davila is a 73 year old female with hypertension who presents with elevated blood pressure and back pain.  Hypertension and antihypertensive therapy - History of hypertension with increased blood pressure over the past two years - Recent home blood pressure readings between 110 and 130 mmHg - Hospitalized on November 6th for possible cardiac issue; cardiac testing was normal - Started losartan  on November 24th, which has improved blood pressure control  Back pain - Significant unilateral back pain developed after starting losartan  - Pain is worse at night  Upper respiratory symptoms - Sore throat onset Friday night - Green nasal discharge in the morning - No fever, gastrointestinal symptoms, or known sick contacts - Taking over-the-counter 12-hour mucus relief medication  Cardiac rhythm disturbances - Episodes of tachycardia and bradycardia - Recent EKG showed bradycardia - Nocturnal episodes of rapid heart rate - Palpitations and prominent heartbeats when lying on left side  Hypothyroidism - Diagnosed hypothyroidism treated with levothyroxine  - Thyroid  levels checked on October 9th  Psychosocial stress and lifestyle changes - Significant stress due to new full-time job that was expected to be part-time - Multiple lip blisters attributed to stress - Decreased exercise due to work schedule - Consumes two to three glasses of wine nightly     ROS Per HPI      Objective:    BP 110/70 (BP Location: Left Arm, Patient  Position: Sitting)   Pulse 66   Temp 98.8 F (37.1 C) (Temporal)   Ht 5' 8 (1.727 m)   Wt 138 lb (62.6 kg) Comment: verbal  SpO2 95%   BMI 20.98 kg/m    Physical Exam Vitals and nursing note reviewed.  Constitutional:      General: She is not in acute distress.    Appearance: Normal appearance. She is normal weight.  HENT:     Head: Normocephalic and atraumatic.     Right Ear: External ear normal.     Left Ear: External ear normal.     Nose: Nose normal.     Mouth/Throat:     Mouth: Mucous membranes are moist.     Pharynx: Oropharynx is clear.  Eyes:     Extraocular Movements: Extraocular movements intact.     Pupils: Pupils are equal, round, and reactive to light.  Neck:     Vascular: No carotid bruit.  Cardiovascular:     Rate and Rhythm: Normal rate and regular rhythm.     Pulses: Normal pulses.     Heart sounds: Normal heart sounds.  Pulmonary:     Effort: Pulmonary effort is normal. No respiratory distress.     Breath sounds: Normal breath sounds. No wheezing, rhonchi or rales.  Musculoskeletal:        General: Normal range of motion.     Cervical back: Normal range of motion.     Right lower leg: No edema.     Left lower leg: No edema.  Lymphadenopathy:     Cervical: No  cervical adenopathy.  Neurological:     General: No focal deficit present.     Mental Status: She is alert and oriented to person, place, and time.  Psychiatric:        Mood and Affect: Mood normal.        Thought Content: Thought content normal.     No results found for any visits on 04/11/24.      Assessment & Plan:   Assessment and Plan Assessment & Plan Essential hypertension Blood pressure controlled with losartan . Night back pain possibly related to medication. Home readings may be inaccurate. - Continue losartan . - Schedule nurse visit to compare home and office blood pressure readings. - Monitor for headaches, dizziness, or vision changes.  Acute pharyngitis Symptoms  suggest viral infection. No bacterial signs. COVID-19 unlikely. - Continue symptomatic treatment with mucus relief. - Contact office if symptoms worsen or persist by Friday.  Hypothyroidism Stable on levothyroxine . Recent thyroid  tests normal. - Continue current levothyroxine  dosage.  General health maintenance Discussed stress management, exercise, and alcohol moderation. - Encouraged stress management and regular exercise. - Advised moderation in alcohol consumption.     No orders of the defined types were placed in this encounter.    No orders of the defined types were placed in this encounter.   Return in about 3 months (around 07/10/2024) for meds OV.  [name], FNP  "

## 2024-04-11 NOTE — Patient Instructions (Addendum)
 Continue losartan  for now.   Blood pressure looks great today.   I would have you bring in your blood pressure cuff and schedule a nurse visit to have it checked against our manual blood pressure.  Let me know Friday how you are feeling and we can go from there.   Follow-up with me for new or worsening symptoms.  Continue current medication regimen.   Follow up with me in about 3 months for labs and medication management, sooner if needed.

## 2024-04-15 ENCOUNTER — Other Ambulatory Visit: Payer: Self-pay | Admitting: Family Medicine

## 2024-04-15 ENCOUNTER — Telehealth: Payer: Self-pay

## 2024-04-15 MED ORDER — CEFDINIR 300 MG PO CAPS: 300.0000 mg | ORAL_CAPSULE | Freq: Two times a day (BID) | ORAL | 0 refills | Status: AC

## 2024-04-15 NOTE — Telephone Encounter (Signed)
 Copied from CRM #[id number]. Topic: Clinical - Medication Question >> Apr 15, 2024  8:48 AM [name] wrote: Reason for CRM: Patient called.. saw [name] on Tuesday.. was told to call back for script if she wasn't feeling better.  Can you send something in for her? >> Apr 15, 2024  8:51 AM [name] wrote: Prefers this pharmacy [facility] [phone] - [place], [place] - [address]

## 2024-04-15 NOTE — Telephone Encounter (Signed)
 LVM for patient, making her aware of medication being sent in.

## 2024-04-18 ENCOUNTER — Encounter: Admitting: Family Medicine

## 2024-04-28 ENCOUNTER — Other Ambulatory Visit

## 2024-04-29 ENCOUNTER — Telehealth: Payer: Self-pay

## 2024-04-29 NOTE — Telephone Encounter (Signed)
 Copied from CRM #[id number]. Topic: Clinical - Medication Question >> Apr 29, 2024 12:18 PM Fonda T wrote: Reason for CRM: Pt reports just recently started taking Losartan  medication, and since she has started medication, is having right mid back ache, and soreness.  Pt denies any urinary symptoms and no pain.  Pt would like to know what she can take for symptoms she has been having since starting medication.  Pt is requesting a return call at [phone]   Pt aware of same day follow up call.    Preferred pharamacy:  [phone] - [place], [place] - [address] AT [facility] [address] [place] [id number] Phone: [phone] Fax: [phone]

## 2024-05-02 NOTE — Telephone Encounter (Signed)
 Left detailed voicemail for patient, advised to call back if needed.

## 2024-05-23 ENCOUNTER — Ambulatory Visit: Admitting: Family Medicine

## 2024-05-23 ENCOUNTER — Telehealth: Payer: Self-pay

## 2024-05-23 NOTE — Telephone Encounter (Signed)
 LVM for patient in regards to rescheduling visit for today. Office closed due to weather.

## 2024-05-24 ENCOUNTER — Ambulatory Visit: Admitting: Cardiology

## 2024-05-24 ENCOUNTER — Encounter: Payer: Self-pay | Admitting: Cardiology

## 2024-07-14 ENCOUNTER — Ambulatory Visit: Admitting: Family Medicine

## 2024-07-19 ENCOUNTER — Ambulatory Visit: Admitting: Cardiology

## 2025-01-27 ENCOUNTER — Ambulatory Visit

## 2025-01-27 ENCOUNTER — Encounter: Admitting: Family Medicine
